# Patient Record
Sex: Female | Born: 1935 | Race: White | Hispanic: No | Smoking: Former smoker
Health system: Southern US, Community
[De-identification: ages and names within clinical notes are randomized; demographics above are authoritative.]

## PROBLEM LIST (undated history)

## (undated) DIAGNOSIS — C801 Malignant (primary) neoplasm, unspecified: Secondary | ICD-10-CM

## (undated) DIAGNOSIS — I509 Heart failure, unspecified: Secondary | ICD-10-CM

## (undated) HISTORY — PX: ABDOMINAL HYSTERECTOMY: SHX81

---

## 2006-03-20 ENCOUNTER — Ambulatory Visit: Payer: Self-pay | Admitting: Family Medicine

## 2006-04-02 ENCOUNTER — Ambulatory Visit: Payer: Self-pay | Admitting: Family Medicine

## 2007-02-04 ENCOUNTER — Ambulatory Visit: Payer: Self-pay | Admitting: Family

## 2007-06-13 ENCOUNTER — Ambulatory Visit: Payer: Self-pay | Admitting: Family

## 2007-06-27 ENCOUNTER — Ambulatory Visit: Payer: Self-pay | Admitting: Family Medicine

## 2008-01-09 ENCOUNTER — Ambulatory Visit: Payer: Self-pay | Admitting: Family

## 2008-08-06 ENCOUNTER — Ambulatory Visit: Payer: Self-pay | Admitting: Family

## 2008-09-24 ENCOUNTER — Ambulatory Visit: Payer: Self-pay | Admitting: Family Medicine

## 2009-02-25 ENCOUNTER — Ambulatory Visit: Payer: Self-pay | Admitting: Family

## 2009-07-22 ENCOUNTER — Ambulatory Visit: Payer: Self-pay | Admitting: Family

## 2009-07-30 ENCOUNTER — Ambulatory Visit: Payer: Self-pay | Admitting: Family

## 2009-09-27 ENCOUNTER — Ambulatory Visit: Payer: Self-pay | Admitting: Family Medicine

## 2010-02-17 ENCOUNTER — Ambulatory Visit: Payer: Self-pay | Admitting: Family

## 2010-05-26 ENCOUNTER — Ambulatory Visit: Payer: Self-pay | Admitting: Family

## 2010-12-12 ENCOUNTER — Ambulatory Visit: Payer: Self-pay | Admitting: Family Medicine

## 2010-12-16 ENCOUNTER — Ambulatory Visit: Payer: Self-pay | Admitting: Family Medicine

## 2011-07-27 ENCOUNTER — Ambulatory Visit: Payer: Self-pay | Admitting: Family Medicine

## 2012-09-04 ENCOUNTER — Ambulatory Visit: Payer: Self-pay | Admitting: Family Medicine

## 2013-10-14 ENCOUNTER — Ambulatory Visit: Payer: Self-pay | Admitting: Family Medicine

## 2013-10-24 ENCOUNTER — Ambulatory Visit: Payer: Self-pay | Admitting: Family Medicine

## 2013-12-30 DIAGNOSIS — M25511 Pain in right shoulder: Secondary | ICD-10-CM | POA: Insufficient documentation

## 2014-02-10 DIAGNOSIS — I10 Essential (primary) hypertension: Secondary | ICD-10-CM | POA: Insufficient documentation

## 2014-02-18 DIAGNOSIS — I059 Rheumatic mitral valve disease, unspecified: Secondary | ICD-10-CM | POA: Insufficient documentation

## 2014-10-06 ENCOUNTER — Other Ambulatory Visit: Payer: Self-pay | Admitting: Family Medicine

## 2014-10-06 DIAGNOSIS — Z1231 Encounter for screening mammogram for malignant neoplasm of breast: Secondary | ICD-10-CM

## 2014-10-20 ENCOUNTER — Other Ambulatory Visit: Payer: Self-pay | Admitting: Family Medicine

## 2014-10-20 ENCOUNTER — Ambulatory Visit
Admission: RE | Admit: 2014-10-20 | Discharge: 2014-10-20 | Disposition: A | Discharge status: Home / Self Care | Payer: Medicare Other | Source: Ambulatory Visit | Attending: Family Medicine | Admitting: Family Medicine

## 2014-10-20 DIAGNOSIS — Z1231 Encounter for screening mammogram for malignant neoplasm of breast: Secondary | ICD-10-CM | POA: Diagnosis not present

## 2016-01-10 ENCOUNTER — Encounter: Payer: Self-pay | Admitting: *Deleted

## 2016-01-10 DIAGNOSIS — W228XXA Striking against or struck by other objects, initial encounter: Secondary | ICD-10-CM | POA: Insufficient documentation

## 2016-01-10 DIAGNOSIS — Y999 Unspecified external cause status: Secondary | ICD-10-CM | POA: Insufficient documentation

## 2016-01-10 DIAGNOSIS — Y939 Activity, unspecified: Secondary | ICD-10-CM | POA: Insufficient documentation

## 2016-01-10 DIAGNOSIS — Y929 Unspecified place or not applicable: Secondary | ICD-10-CM | POA: Diagnosis not present

## 2016-01-10 DIAGNOSIS — I509 Heart failure, unspecified: Secondary | ICD-10-CM | POA: Insufficient documentation

## 2016-01-10 DIAGNOSIS — Z23 Encounter for immunization: Secondary | ICD-10-CM | POA: Insufficient documentation

## 2016-01-10 DIAGNOSIS — Z87891 Personal history of nicotine dependence: Secondary | ICD-10-CM | POA: Insufficient documentation

## 2016-01-10 DIAGNOSIS — S81811A Laceration without foreign body, right lower leg, initial encounter: Secondary | ICD-10-CM | POA: Insufficient documentation

## 2016-01-10 NOTE — ED Triage Notes (Signed)
Pt accidentally walked into lever on side of recliner sustaining laceration to R shin. Pt has large, irregular wound to R shin, bleeding controlled at this time. Drsg placed during triage. Pt has hx of CHF and has +2 dependent edema to bilateral lower extremities.

## 2016-01-11 ENCOUNTER — Emergency Department
Admission: EM | Admit: 2016-01-11 | Discharge: 2016-01-11 | Disposition: A | Discharge status: Home / Self Care | Payer: Medicare Other | Attending: Emergency Medicine | Admitting: Emergency Medicine

## 2016-01-11 DIAGNOSIS — S81811A Laceration without foreign body, right lower leg, initial encounter: Secondary | ICD-10-CM

## 2016-01-11 HISTORY — DX: Heart failure, unspecified: I50.9

## 2016-01-11 MED ORDER — TETANUS-DIPHTH-ACELL PERTUSSIS 5-2.5-18.5 LF-MCG/0.5 IM SUSP
0.5000 mL | Freq: Once | INTRAMUSCULAR | Status: AC
  Administered 2016-01-11: 0.5 mL via INTRAMUSCULAR
  Filled 2016-01-11: qty 0.5

## 2016-01-11 MED ORDER — SULFAMETHOXAZOLE-TRIMETHOPRIM 800-160 MG PO TABS: 1.0000 | ORAL_TABLET | Freq: Two times a day (BID) | ORAL | 0 refills | Status: DC

## 2016-01-11 MED ORDER — LIDOCAINE-EPINEPHRINE (PF) 1 %-1:200000 IJ SOLN
10.0000 mL | Freq: Once | INTRAMUSCULAR | Status: AC
  Administered 2016-01-11: 10 mL
  Filled 2016-01-11: qty 30

## 2016-01-11 NOTE — ED Notes (Signed)
PA at bedside.

## 2016-01-11 NOTE — ED Provider Notes (Signed)
[facility] Emergency Department Provider Note  ____________________________________________  Time seen: Approximately 3:31 AM  I have reviewed the triage vital signs and the nursing notes.   HISTORY  Chief Complaint Laceration    HPI Gloria Grimes is a 82 y.o. female who presents emergency department complaining of a deep gash to her right lower leg. Patient states that she was walking past her recliner when she accidentally hit the limit that controls the legs on the recliner. Patient states that she has a deep gash to her right lower extremity. Patient is unsure of her last tetanus shot. She is not on blood thinners. Patient was able control bleeding with direct pressure. She denies any numbness or tingling distally. She denies any other injury or complaint at this time.   Past Medical History:  Diagnosis Date  . CHF (congestive heart failure) ([place])     There are no active problems to display for this patient.   Past Surgical History:  Procedure Laterality Date  . ABDOMINAL HYSTERECTOMY      Prior to Admission medications   Medication Sig Start Date End Date Taking? Authorizing Provider  sulfamethoxazole-trimethoprim (BACTRIM DS,SEPTRA DS) 800-160 MG tablet Take 1 tablet by mouth 2 (two) times daily. 01/11/16   Charline Bills Cuthriell, PA-C    Allergies Penicillins and Shellfish allergy  History reviewed. No pertinent family history.  Social History Social History  Substance Use Topics  . Smoking status: Former Research scientist (life sciences)  . Smokeless tobacco: Never Used  . Alcohol use No     Comment: wine     Review of Systems  Constitutional: No fever/chills Cardiovascular: no chest pain. Respiratory: no cough. No SOB. Musculoskeletal: Negative for musculoskeletal pain. Skin: Positive for laceration to the right lower extremity. Neurological: Negative for headaches, focal weakness or numbness. 10-point ROS otherwise  negative.  ____________________________________________   PHYSICAL EXAM:  VITAL SIGNS: ED Triage Vitals  Enc Vitals Group     BP 01/10/16 2252 (!) 160/71     Pulse Rate 01/10/16 2252 82     Resp 01/10/16 2252 20     Temp 01/10/16 2252 98.1 F (36.7 C)     Temp Source 01/10/16 2252 Oral     SpO2 01/10/16 2252 93 %     Weight 01/10/16 2253 150 lb (68 kg)     Height 01/10/16 2253 5\' 2"  (1.575 m)     Head Circumference --      Peak Flow --      Pain Score 01/10/16 2253 2     Pain Loc --      Pain Edu? --      Excl. in [place]? --      Constitutional: Alert and oriented. Well appearing and in no acute distress. Eyes: Conjunctivae are normal. PERRL. EOMI. Head: Atraumatic. Cardiovascular: Normal rate, regular rhythm. Normal S1 and S2.  Good peripheral circulation. Patient does have bilateral lower extremity 2+ edema. Pulses are appreciated distally. Respiratory: Normal respiratory effort without tachypnea or retractions. Lungs CTAB. Good air entry to the bases with no decreased or absent breath sounds. Musculoskeletal: Full range of motion to all extremities. No gross deformities appreciated. Neurologic:  Normal speech and language. No gross focal neurologic deficits are appreciated.  Skin:  Skin is warm, dry and intact. No rash noted. The laceration noted to right lower leg. This is approximately 5 cm in length and 2 half centimeters in width. A avulsion/skin flap covers proximal portion. There is missing tissue at the distal aspect  of the laceration. No bleeding at this time. No visible foreign body. Pulses and sensation are intact distally. Psychiatric: Mood and affect are normal. Speech and behavior are normal. Patient exhibits appropriate insight and judgement.   ____________________________________________   LABS (all labs ordered are listed, but only abnormal results are displayed)  Labs Reviewed - No data to  display ____________________________________________  EKG   ____________________________________________  RADIOLOGY   No results found.  ____________________________________________    PROCEDURES  Procedure(s) performed:    Marland KitchenMarland KitchenLaceration Repair Date/Time: 01/11/2016 5:54 AM Performed by: Betha Loa D Authorized by: Betha Loa D   Consent:    Consent obtained:  Verbal   Consent given by:  Patient   Risks discussed:  Infection, poor cosmetic result, need for additional repair and poor wound healing   Alternatives discussed:  Referral Anesthesia (see MAR for exact dosages):    Anesthesia method:  Local infiltration   Local anesthetic:  Lidocaine 1% WITH epi Laceration details:    Location:  Leg   Leg location:  R lower leg   Length (cm):  5   Depth (mm):  10 Repair type:    Repair type:  Complex Pre-procedure details:    Preparation:  Patient was prepped and draped in usual sterile fashion Exploration:    Hemostasis achieved with:  Epinephrine and direct pressure   Wound exploration: wound explored through full range of motion and entire depth of wound probed and visualized     Contaminated: no   Treatment:    Area cleansed with:  Betadine   Amount of cleaning:  Extensive   Irrigation solution:  Sterile saline   Irrigation volume:  1L   Irrigation method:  Syringe   Debridement:  Minimal Skin repair:    Repair method:  Sutures   Suture size:  3-0   Suture material:  Nylon   Suture technique:  Simple interrupted   Number of sutures:  8 Approximation:    Approximation:  Loose Post-procedure details:    Dressing:  Non-adherent dressing   Patient tolerance of procedure:  Tolerated well, no immediate complications Comments:     Due to the nature of the injury there is missing epithelial tissue at the distal aspect of the laceration. Proximal flap is secured using 8 simple interrupted sutures. Distal aspect is covered with Surgicel and  bandaged.      Medications  lidocaine-EPINEPHrine (XYLOCAINE-EPINEPHrine) 1 %-1:200000 (PF) injection 10 mL (10 mLs Infiltration Given 01/11/16 0218)  Tdap (BOOSTRIX) injection 0.5 mL (0.5 mLs Intramuscular Given 01/11/16 0218)     ____________________________________________   INITIAL IMPRESSION / ASSESSMENT AND PLAN / ED COURSE  Pertinent labs & imaging results that were available during my care of the patient were reviewed by me and considered in my medical decision making (see chart for details).  Clinical Course    Patient's diagnosis is consistent with A laceration to the right lower extremity. This is treated as described above. There is an area that is not covered with epithelial tissu after repair e. This measures approximately 1 cm in diameter. This area is covered effusions Surgicel dressing as well as nonadherent dressing and Coban wrap.. Patient will be discharged home with prescriptions for antibiotics prophylactically.. Patient will follow-up with this department in 2 days for wound recheck. Patient is given ED precautions to return to the ED for any worsening or new symptoms.     ____________________________________________  FINAL CLINICAL IMPRESSION(S) / ED DIAGNOSES  Final diagnoses:  Laceration of right lower leg,  initial encounter      NEW MEDICATIONS STARTED DURING THIS VISIT:  Discharge Medication List as of 01/11/2016  3:32 AM    START taking these medications   Details  sulfamethoxazole-trimethoprim (BACTRIM DS,SEPTRA DS) 800-160 MG tablet Take 1 tablet by mouth 2 (two) times daily., Starting Tue 01/11/2016, Print            This chart was dictated using voice recognition software/Dragon. Despite best efforts to proofread, errors can occur which can change the meaning. Any change was purely unintentional.    Darletta Moll, PA-C 01/11/16 [id number]    Loney Hering, MD 01/11/16 [phone]

## 2016-01-13 ENCOUNTER — Encounter: Payer: Self-pay | Admitting: Emergency Medicine

## 2016-01-13 ENCOUNTER — Emergency Department
Admission: EM | Admit: 2016-01-13 | Discharge: 2016-01-13 | Disposition: A | Discharge status: Home / Self Care | Payer: Medicare Other | Attending: Emergency Medicine | Admitting: Emergency Medicine

## 2016-01-13 DIAGNOSIS — I509 Heart failure, unspecified: Secondary | ICD-10-CM | POA: Diagnosis not present

## 2016-01-13 DIAGNOSIS — Z87891 Personal history of nicotine dependence: Secondary | ICD-10-CM | POA: Insufficient documentation

## 2016-01-13 DIAGNOSIS — Z4801 Encounter for change or removal of surgical wound dressing: Secondary | ICD-10-CM | POA: Diagnosis present

## 2016-01-13 DIAGNOSIS — Z5189 Encounter for other specified aftercare: Secondary | ICD-10-CM

## 2016-01-13 NOTE — ED Notes (Signed)
See triage note, sensation/circulation intact to all limbs.  Pt denies SOB, CP, n/v/d, LOC or dizziness. NAD.

## 2016-01-13 NOTE — Discharge Instructions (Signed)
Keep area clean and dry. Remove the Ace bandage and top gauze dressing. Do not remove second layer of gauze from the leg. Perform this every other day until sutures are removed.

## 2016-01-13 NOTE — ED Triage Notes (Signed)
Pt seen 2 days ago for suture and was told to come for recheck.

## 2016-01-13 NOTE — ED Provider Notes (Signed)
[facility] Emergency Department Provider Note  ____________________________________________  Time seen: Approximately 4:36 PM  I have reviewed the triage vital signs and the nursing notes.   HISTORY  Chief Complaint Wound Check    HPI Gloria Grimes is a 82 y.o. female who presents emergency department for wound recheck. Patient was seen by myself 2 days prior for deep laceration/avulsion. See previous note for details. Due to the nature of the wound with a 1.5 cm area that was missing epidermal layers, patient was to follow up today with myself for wound evaluation. Patient denies any pain, swelling, increased erythema, or drainage from site. Patient has no pain at this time. Patient does have CHF with bilateral lower extremity edema. This is at baseline per patient. There is no change reported from time of previous visit.   Past Medical History:  Diagnosis Date  . CHF (congestive heart failure) ([place])     There are no active problems to display for this patient.   Past Surgical History:  Procedure Laterality Date  . ABDOMINAL HYSTERECTOMY      Prior to Admission medications   Medication Sig Start Date End Date Taking? Authorizing Provider  sulfamethoxazole-trimethoprim (BACTRIM DS,SEPTRA DS) 800-160 MG tablet Take 1 tablet by mouth 2 (two) times daily. 01/11/16   Charline Bills Cuthriell, PA-C    Allergies Penicillins and Shellfish allergy  History reviewed. No pertinent family history.  Social History Social History  Substance Use Topics  . Smoking status: Former Research scientist (life sciences)  . Smokeless tobacco: Never Used  . Alcohol use No     Comment: wine     Review of Systems  Constitutional: No fever/chills Cardiovascular: no chest pain. Respiratory: no cough. No SOB. Musculoskeletal: Negative for musculoskeletal pain. Skin: Positive for laceration/avulsion to right lower leg Neurological: Negative for headaches, focal weakness or numbness. 10-point  ROS otherwise negative.  ____________________________________________   PHYSICAL EXAM:  VITAL SIGNS: ED Triage Vitals [01/13/16 1558]  Enc Vitals Group     BP (!) 153/70     Pulse Rate 94     Resp 16     Temp 98.3 F (36.8 C)     Temp Source Oral     SpO2 99 %     Weight 150 lb (68 kg)     Height 5\' 2"  (1.575 m)     Head Circumference      Peak Flow      Pain Score      Pain Loc      Pain Edu?      Excl. in [place]?      Constitutional: Alert and oriented. Well appearing and in no acute distress. Eyes: Conjunctivae are normal. PERRL. EOMI. Head: Atraumatic. Cardiovascular: Normal rate, regular rhythm. Normal S1 and S2.  Good peripheral circulation. Respiratory: Normal respiratory effort without tachypnea or retractions. Lungs CTAB. Good air entry to the bases with no decreased or absent breath sounds. Musculoskeletal: Full range of motion to all extremities. No gross deformities appreciated. Neurologic:  Normal speech and language. No gross focal neurologic deficits are appreciated.  Skin:  Skin is warm, dry and intact. No rash noted.Laceration/avulsion is visualized. Bandaging judging is removed. Sutured area is healing appropriately with no signs of infection. Surgicel is still in place over both in wound. While this is not completely removed, surrounding area is visualized with no signs of infection. Area is nontender to palpation. Psychiatric: Mood and affect are normal. Speech and behavior are normal. Patient exhibits appropriate insight and  judgement.   ____________________________________________   LABS (all labs ordered are listed, but only abnormal results are displayed)  Labs Reviewed - No data to display ____________________________________________  EKG   ____________________________________________  RADIOLOGY   No results found.  ____________________________________________    PROCEDURES  Procedure(s) performed:    Procedures    Medications  - No data to display   ____________________________________________   INITIAL IMPRESSION / ASSESSMENT AND PLAN / ED COURSE  Pertinent labs & imaging results that were available during my care of the patient were reviewed by me and considered in my medical decision making (see chart for details).  Clinical Course    Patient's diagnosis is consistent with Wound follow-up. At this time wound appears to be healing appropriately both over the sutured portion as well as portion with no epidermal covering. Surgicel is still in place. No bleeding. No pustular drainage. Patient is given continued wound care instructions. Patient will continue antibiotics prescribed previously prophylactically.. Patient will follow-up with primary care provider in 5 days for suture removal Patient is given ED precautions to return to the ED for any worsening or new symptoms.     ____________________________________________  FINAL CLINICAL IMPRESSION(S) / ED DIAGNOSES  Final diagnoses:  Visit for wound check      NEW MEDICATIONS STARTED DURING THIS VISIT:  Discharge Medication List as of 01/13/2016  4:38 PM          This chart was dictated using voice recognition software/Dragon. Despite best efforts to proofread, errors can occur which can change the meaning. Any change was purely unintentional.    Darletta Moll, PA-C 01/13/16 [place], MD 01/13/16 2020

## 2016-02-02 ENCOUNTER — Encounter: Payer: Medicare Other | Attending: Internal Medicine | Admitting: Internal Medicine

## 2016-02-02 DIAGNOSIS — Z87891 Personal history of nicotine dependence: Secondary | ICD-10-CM | POA: Insufficient documentation

## 2016-02-02 DIAGNOSIS — Z880 Allergy status to penicillin: Secondary | ICD-10-CM | POA: Diagnosis not present

## 2016-02-02 DIAGNOSIS — I87301 Chronic venous hypertension (idiopathic) without complications of right lower extremity: Secondary | ICD-10-CM | POA: Insufficient documentation

## 2016-02-02 DIAGNOSIS — I429 Cardiomyopathy, unspecified: Secondary | ICD-10-CM | POA: Insufficient documentation

## 2016-02-02 DIAGNOSIS — I509 Heart failure, unspecified: Secondary | ICD-10-CM | POA: Insufficient documentation

## 2016-02-02 DIAGNOSIS — I110 Hypertensive heart disease with heart failure: Secondary | ICD-10-CM | POA: Diagnosis not present

## 2016-02-02 DIAGNOSIS — S81811S Laceration without foreign body, right lower leg, sequela: Secondary | ICD-10-CM | POA: Diagnosis not present

## 2016-02-02 DIAGNOSIS — X58XXXS Exposure to other specified factors, sequela: Secondary | ICD-10-CM | POA: Insufficient documentation

## 2016-02-03 NOTE — Progress Notes (Signed)
Gloria, Grimes ([id number]) Visit Report for 02/02/2016 Allergy List Details Patient Name: Gloria Grimes, Gloria Grimes Date of Service: 02/02/2016 9:30 AM Medical Record Patient Account Number: [id number] Number: Treating RN: Ahmed Prima 10-28-1930 (82 y.o. Other Clinician: Date of Birth/Sex: Female) Treating ROBSON, MICHAEL Primary Care Physician: [place] Pink Physician/Extender: G Referring Physician: Payton Doughty in Treatment: 0 Allergies Active Allergies penicillin Shellfish Containing Products erythromycin ethylsuccinate Declomycin clindamycin triamcinolone acetonide Allergy Notes Electronic Signature(s) Signed: 02/02/2016 5:27:01 PM By: Alric Quan Entered By: Alric Quan on 02/02/2016 09:50:17 Gloria Grimes ([id number]) -------------------------------------------------------------------------------- Arrival Information Details Patient Name: Gloria Grimes Date of Service: 02/02/2016 9:30 AM Medical Record Patient Account Number: [id number] Number: Treating RN: Ahmed Prima December 06, 1930 (82 y.o. Other Clinician: Date of Birth/Sex: Female) Treating ROBSON, MICHAEL Primary Care Physician: [place] Pink Physician/Extender: G Referring Physician: Payton Doughty in Treatment: 0 Visit Information Patient Arrived: Ambulatory Arrival Time: 09:47 Accompanied By: self Transfer Assistance: None Patient Identification Verified: Yes Secondary Verification Process Yes Completed: Patient Requires Transmission-Based No Precautions: Patient Has Alerts: No Electronic Signature(s) Signed: 02/02/2016 5:27:01 PM By: Alric Quan Entered By: Alric Quan on 02/02/2016 09:47:21 Gloria Grimes ([id number]) -------------------------------------------------------------------------------- Clinic Level of Care Assessment Details Patient Name: Gloria Grimes Date of Service: 02/02/2016 9:30 AM Medical Record Patient Account Number:  [id number] Number: Treating RN: Ahmed Prima 1930/08/25 (82 y.o. Other Clinician: Date of Birth/Sex: Female) Treating ROBSON, MICHAEL Primary Care Physician: [place] Pink Physician/Extender: G Referring Physician: Payton Doughty in Treatment: 0 Clinic Level of Care Assessment Items TOOL 1 Quantity Score X - Use when EandM and Procedure is performed on INITIAL visit 1 0 ASSESSMENTS - Nursing Assessment / Reassessment X - General Physical Exam (combine w/ comprehensive assessment (listed just 1 20 below) when performed on new pt. evals) X - Comprehensive Assessment (HX, ROS, Risk Assessments, Wounds Hx, etc.) 1 25 ASSESSMENTS - Wound and Skin Assessment / Reassessment []  - Dermatologic / Skin Assessment (not related to wound area) 0 ASSESSMENTS - Ostomy and/or Continence Assessment and Care []  - Incontinence Assessment and Management 0 []  - Ostomy Care Assessment and Management (repouching, etc.) 0 PROCESS - Coordination of Care []  - Simple Patient / Family Education for ongoing care 0 X - Complex (extensive) Patient / Family Education for ongoing care 1 20 X - Staff obtains Programmer, systems, Records, Test Results / Process Orders 1 10 []  - Staff telephones HHA, Nursing Homes / Clarify orders / etc 0 []  - Routine Transfer to another Facility (non-emergent condition) 0 []  - Routine Hospital Admission (non-emergent condition) 0 X - New Admissions / Biomedical engineer / Ordering NPWT, Apligraf, etc. 1 15 []  - Emergency Hospital Admission (emergent condition) 0 PROCESS - Special Needs []  - Pediatric / Minor Patient Management 0 TAQWA, LINKENHOKER. ([id number]) []  - Isolation Patient Management 0 []  - Hearing / Language / Visual special needs 0 []  - Assessment of Community assistance (transportation, D/C planning, etc.) 0 []  - Additional assistance / Altered mentation 0 []  - Support Surface(s) Assessment (bed, cushion, seat, etc.) 0 INTERVENTIONS - Miscellaneous []  -  External ear exam 0 []  - Patient Transfer (multiple staff / Civil Service fast streamer / Similar devices) 0 []  - Simple Staple / Suture removal (25 or less) 0 []  - Complex Staple / Suture removal (26 or more) 0 []  - Hypo/Hyperglycemic Management (do not check if billed separately) 0 X - Ankle / Brachial Index (ABI) - do not check if billed separately 1 15 Has the patient been  seen at the hospital within the last three years: Yes Total Score: 105 Level Of Care: New/Established - Level 3 Electronic Signature(s) Signed: 02/02/2016 5:27:01 PM By: Alric Quan Entered By: Alric Quan on 02/02/2016 16:19:45 Gloria Grimes ([id number]) -------------------------------------------------------------------------------- Encounter Discharge Information Details Patient Name: Gloria Grimes Date of Service: 02/02/2016 9:30 AM Medical Record Patient Account Number: [id number] Number: Treating RN: Ahmed Prima Mar 20, 1931 (82 y.o. Other Clinician: Date of Birth/Sex: Female) Treating ROBSON, MICHAEL Primary Care Physician: [place] Pink Physician/Extender: G Referring Physician: Payton Doughty in Treatment: 0 Encounter Discharge Information Items Discharge Pain Level: 0 Discharge Condition: Stable Ambulatory Status: Ambulatory Discharge Destination: Home Transportation: Private Auto Accompanied By: self Schedule Follow-up Appointment: Yes Medication Reconciliation completed and provided to Patient/Care No Angelissa Supan: Provided on Clinical Summary of Care: 02/02/2016 Form Type Recipient Paper Patient JF Electronic Signature(s) Signed: 02/02/2016 10:58:40 AM By: Ruthine Dose Entered By: Ruthine Dose on 02/02/2016 10:58:39 Gloria Grimes ([id number]) -------------------------------------------------------------------------------- Lower Extremity Assessment Details Patient Name: Gloria Grimes Date of Service: 02/02/2016 9:30 AM Medical Record Patient Account Number:  [id number] Number: Treating RN: Ahmed Prima 1930/08/09 (82 y.o. Other Clinician: Date of Birth/Sex: Female) Treating ROBSON, [place] Primary Care Physician: [place] Pink Physician/Extender: G Referring Physician: Payton Doughty in Treatment: 0 Edema Assessment Assessed: [Left: No] [Right: No] Edema: [Left: Yes] [Right: Yes] Calf Left: Right: Point of Measurement: 28 cm From Medial Instep 35.2 cm 35.7 cm Ankle Left: Right: Point of Measurement: 10 cm From Medial Instep 22.8 cm 22.5 cm Vascular Assessment Pulses: Posterior Tibial Dorsalis Pedis Palpable: [Left:No] [Right:No] Doppler: [Left:Monophasic] [Right:Monophasic] Extremity colors, hair growth, and conditions: Extremity Color: [Left:Red] [Right:Mottled] Hair Growth on Extremity: [Left:Yes] [Right:Yes] Capillary Refill: [Left:< 3 seconds] [Right:> 3 seconds] Blood Pressure: Brachial: [Left:130] [Right:142] Dorsalis Pedis: 100 [Left:Dorsalis Pedis:] Ankle: Posterior Tibial: 110 [Left:Posterior Tibial: 122 0.77] [Right:0.86] Toe Nail Assessment Left: Right: Thick: No No Discolored: No No Deformed: No No Improper Length and Hygiene: No No EMMALYN, COWDIN ([id number]) Electronic Signature(s) Signed: 02/02/2016 5:27:01 PM By: Alric Quan Entered By: Alric Quan on 02/02/2016 10:23:27 Gloria Grimes ([id number]) -------------------------------------------------------------------------------- Multi Wound Chart Details Patient Name: Gloria Grimes Date of Service: 02/02/2016 9:30 AM Medical Record Patient Account Number: [id number] Number: Treating RN: Ahmed Prima 1931-01-22 (82 y.o. Other Clinician: Date of Birth/Sex: Female) Treating ROBSON, Irvona Primary Care Physician: [place] Pink Physician/Extender: G Referring Physician: Payton Doughty in Treatment: 0 Vital Signs Height(in): 62 Pulse(bpm): 85 Weight(lbs): 156.9 Blood Pressure 134/52 (mmHg): Body  Mass Index(BMI): 29 Temperature(F): 97.7 Respiratory Rate 20 (breaths/min): Photos: [1:No Photos] [N/A:N/A] Wound Location: [1:Right Lower Leg - Anterior N/A] Wounding Event: [1:Trauma] [N/A:N/A] Primary Etiology: [1:Trauma, Other] [N/A:N/A] Comorbid History: [1:Congestive Heart Failure, N/A Hypertension] Date Acquired: [1:01/19/2016] [N/A:N/A] Weeks of Treatment: [1:0] [N/A:N/A] Wound Status: [1:Open] [N/A:N/A] Measurements L x W x D 2.1x1.8x0.3 [N/A:N/A] (cm) Area (cm) : [1:2.969] [N/A:N/A] Volume (cm) : [1:0.891] [N/A:N/A] % Reduction in Area: [1:0.00%] [N/A:N/A] % Reduction in Volume: 0.00% [N/A:N/A] Classification: [1:Partial Thickness] [N/A:N/A] Exudate Amount: [1:Large] [N/A:N/A] Exudate Type: [1:Serosanguineous] [N/A:N/A] Exudate Color: [1:red, brown] [N/A:N/A] Wound Margin: [1:Distinct, outline attached N/A] Granulation Amount: [1:Small (1-33%)] [N/A:N/A] Granulation Quality: [1:Red] [N/A:N/A] Necrotic Amount: [1:Large (67-100%)] [N/A:N/A] Necrotic Tissue: [1:Eschar, Adherent Slough N/A] Exposed Structures: [1:Fascia: No Fat: No Tendon: No Muscle: No] [N/A:N/A] Joint: No Bone: No Limited to Skin Breakdown Epithelialization: None N/A N/A Periwound Skin Texture: Edema: Yes N/A N/A Periwound Skin Maceration: Yes N/A N/A Moisture: Moist: Yes Periwound Skin Color: Erythema: Yes N/A  N/A Erythema Location: Circumferential N/A N/A Temperature: No Abnormality N/A N/A Tenderness on Yes N/A N/A Palpation: Wound Preparation: Ulcer Cleansing: N/A N/A Rinsed/Irrigated with Saline Topical Anesthetic Applied: Other: lidocaine 4% Treatment Notes Electronic Signature(s) Signed: 02/02/2016 5:27:01 PM By: Alric Quan Entered By: Alric Quan on 02/02/2016 10:36:21 VELETA, GUCK ([id number]) -------------------------------------------------------------------------------- [place] Details Patient Name: Gloria Grimes Date of Service:  02/02/2016 9:30 AM Medical Record Patient Account Number: [id number] Number: Treating RN: Ahmed Prima 02/13/1931 (82 y.o. Other Clinician: Date of Birth/Sex: Female) Treating ROBSON, [place] Primary Care Physician: [place] Pink Physician/Extender: G Referring Physician: Payton Doughty in Treatment: 0 Active Inactive Nutrition Nursing Diagnoses: Imbalanced nutrition Goals: Patient/caregiver agrees to and verbalizes understanding of need to use nutritional supplements and/or vitamins as prescribed Date Initiated: 02/02/2016 Goal Status: Active Interventions: Assess patient nutrition upon admission and as needed per policy Notes: Orientation to the Wound Care Program Nursing Diagnoses: Knowledge deficit related to the wound healing center program Goals: Patient/caregiver will verbalize understanding of the [facility] Date Initiated: 02/02/2016 Goal Status: Active Interventions: Provide education on orientation to the wound center Notes: Pain, Acute or Chronic Nursing Diagnoses: Pain, acute or chronic: actual or potential Potential alteration in comfort, pain ELLEEN, OSTERKAMP ([id number]) Goals: Patient will verbalize adequate pain control and receive pain control interventions during procedures as needed Date Initiated: 02/02/2016 Goal Status: Active Interventions: Assess comfort goal upon admission Complete pain assessment as per visit requirements Notes: Soft Tissue Infection Nursing Diagnoses: Impaired tissue integrity Goals: Patient/caregiver will verbalize understanding of or measures to prevent infection and contamination in the home setting Date Initiated: 02/02/2016 Goal Status: Active Patient's soft tissue infection will resolve Date Initiated: 02/02/2016 Goal Status: Active Interventions: Assess signs and symptoms of infection every visit Provide education on infection Notes: Wound/Skin Impairment Nursing  Diagnoses: Impaired tissue integrity Goals: Ulcer/skin breakdown will have a volume reduction of 30% by week 4 Date Initiated: 02/02/2016 Goal Status: Active Ulcer/skin breakdown will have a volume reduction of 50% by week 8 Date Initiated: 02/02/2016 Goal Status: Active Ulcer/skin breakdown will have a volume reduction of 80% by week 12 Date Initiated: 02/02/2016 CAYDINCE, BRESCIA ([id number]) Goal Status: Active Interventions: Assess patient/caregiver ability to obtain necessary supplies Assess ulceration(s) every visit Notes: Electronic Signature(s) Signed: 02/02/2016 5:27:01 PM By: Alric Quan Entered By: Alric Quan on 02/02/2016 10:36:05 Gloria Grimes ([id number]) -------------------------------------------------------------------------------- Pain Assessment Details Patient Name: Gloria Grimes Date of Service: 02/02/2016 9:30 AM Medical Record Patient Account Number: [id number] Number: Treating RN: Ahmed Prima 09/10/1930 (82 y.o. Other Clinician: Date of Birth/Sex: Female) Treating ROBSON, MICHAEL Primary Care Physician: [place] Pink Physician/Extender: G Referring Physician: Payton Doughty in Treatment: 0 Active Problems Location of Pain Severity and Description of Pain Patient Has Paino No Site Locations With Dressing Change: No Pain Management and Medication Current Pain Management: Electronic Signature(s) Signed: 02/02/2016 5:27:01 PM By: Alric Quan Entered By: Alric Quan on 02/02/2016 09:47:27 Gloria Grimes ([id number]) -------------------------------------------------------------------------------- Patient/Caregiver Education Details Patient Name: Gloria Grimes Date of Service: 02/02/2016 9:30 AM Medical Record Patient Account Number: [id number] Number: Treating RN: Ahmed Prima 1930-11-14 (82 y.o. Other Clinician: Date of Birth/Gender: Female) Treating ROBSON, Rochelle Primary Care Physician:  [place] Pink Physician/Extender: G Referring Physician: Payton Doughty in Treatment: 0 Education Assessment Education Provided To: Patient Education Topics Provided Infection: Handouts: Infection Prevention and Management Methods: Explain/Verbal Welcome To [facility]: Handouts: Welcome To [facility] Methods: Explain/Verbal Wound/Skin  Impairment: Handouts: Other: keep wral clean and dry Methods: Demonstration, Explain/Verbal Responses: State content correctly Electronic Signature(s) Signed: 02/02/2016 5:27:01 PM By: Alric Quan Entered By: Alric Quan on 02/02/2016 10:45:32 Gloria Grimes ([id number]) -------------------------------------------------------------------------------- Wound Assessment Details Patient Name: Gloria Grimes Date of Service: 02/02/2016 9:30 AM Medical Record Patient Account Number: [id number] Number: Treating RN: Ahmed Prima 09-30-1930 (82 y.o. Other Clinician: Date of Birth/Sex: Female) Treating ROBSON, [place] Primary Care Physician: [place] Pink Physician/Extender: G Referring Physician: Payton Doughty in Treatment: 0 Wound Status Wound Number: 1 Primary Trauma, Other Etiology: Wound Location: Right Lower Leg - Anterior Wound Status: Open Wounding Event: Trauma Comorbid Congestive Heart Failure, Date Acquired: 01/19/2016 History: Hypertension Weeks Of Treatment: 0 Clustered Wound: No Photos Photo Uploaded By: Alric Quan on 02/02/2016 17:20:57 Wound Measurements Length: (cm) 2.1 Width: (cm) 1.8 Depth: (cm) 0.3 Area: (cm) 2.969 Volume: (cm) 0.891 % Reduction in Area: 0% % Reduction in Volume: 0% Epithelialization: None Tunneling: No Undermining: No Wound Description Classification: Partial Thickness Wound Margin: Distinct, outline attached Exudate Amount: Large Exudate Type: Serosanguineous Exudate Color: red, brown Foul Odor After Cleansing: No Wound  Bed Granulation Amount: Small (1-33%) Exposed Structure Granulation Quality: Red Fascia Exposed: No Necrotic Amount: Large (67-100%) Fat Layer Exposed: No ROSALEA, POFF ([id number]) Necrotic Quality: Eschar, Adherent Slough Tendon Exposed: No Muscle Exposed: No Joint Exposed: No Bone Exposed: No Limited to Skin Breakdown Periwound Skin Texture Texture Color No Abnormalities Noted: No No Abnormalities Noted: No Localized Edema: Yes Erythema: Yes Erythema Location: Circumferential Moisture No Abnormalities Noted: No Temperature / Pain Maceration: Yes Temperature: No Abnormality Moist: Yes Tenderness on Palpation: Yes Wound Preparation Ulcer Cleansing: Rinsed/Irrigated with Saline Topical Anesthetic Applied: Other: lidocaine 4%, Treatment Notes Wound #1 (Right, Anterior Lower Leg) 1. Cleansed with: Clean wound with Normal Saline 2. Anesthetic Topical Lidocaine 4% cream to wound bed prior to debridement 3. Peri-wound Care: Moisturizing lotion 4. Dressing Applied: Prisma Ag 5. Secondary Dressing Applied ABD Pad Dry Gauze 7. Secured with Tape Notes kerlix, coban, charcoal Electronic Signature(s) Signed: 02/02/2016 5:27:01 PM By: Alric Quan Entered By: Alric Quan on 02/02/2016 10:28:21 LEYDA, ZEVENBERGEN ([id number]) -------------------------------------------------------------------------------- Vitals Details Patient Name: Gloria Grimes Date of Service: 02/02/2016 9:30 AM Medical Record Patient Account Number: [id number] Number: Treating RN: Ahmed Prima Apr 21, 1931 (82 y.o. Other Clinician: Date of Birth/Sex: Female) Treating ROBSON, [place]et Primary Care Physician: [place] Pink Physician/Extender: G Referring Physician: Payton Doughty in Treatment: 0 Vital Signs Time Taken: 09:47 Temperature (F): 97.7 Height (in): 62 Pulse (bpm): 85 Source: Stated Respiratory Rate (breaths/min): 20 Weight (lbs): 156.9 Blood Pressure  (mmHg): 134/52 Source: Measured Reference Range: 80 - 120 mg / dl Body Mass Index (BMI): 28.7 Electronic Signature(s) Signed: 02/02/2016 5:27:01 PM By: Alric Quan Entered By: Alric Quan on 02/02/2016 09:48:00

## 2016-02-03 NOTE — Progress Notes (Signed)
Gloria Grimes, Gloria Grimes ([id number]) Visit Report for 02/02/2016 Abuse/Suicide Risk Screen Details Patient Name: Gloria Grimes, Gloria Grimes Date of Service: 02/02/2016 9:30 AM Medical Record Patient Account Number: [id number] Number: Treating RN: Ahmed Prima 1931/02/15 (82 y.o. Other Clinician: Date of Birth/Sex: Female) Treating ROBSON, MICHAEL Primary Care Physician/Extender: Curly Rim Physician: Referring Physician: Payton Doughty in Treatment: 0 Abuse/Suicide Risk Screen Items Answer ABUSE/SUICIDE RISK SCREEN: Has anyone close to you tried to hurt or harm you recentlyo No Do you feel uncomfortable with anyone in your familyo No Has anyone forced you do things that you didnot want to doo No Do you have any thoughts of harming yourselfo No Patient displays signs or symptoms of abuse and/or neglect. No Electronic Signature(s) Signed: 02/02/2016 5:27:01 PM By: Alric Quan Entered By: Alric Quan on 02/02/2016 09:58:57 Gloria Grimes ([id number]) -------------------------------------------------------------------------------- Activities of Daily Living Details Patient Name: Gloria Grimes Date of Service: 02/02/2016 9:30 AM Medical Record Patient Account Number: [id number] Number: Treating RN: Ahmed Prima 1931/02/18 (82 y.o. Other Clinician: Date of Birth/Sex: Female) Treating ROBSON, MICHAEL Primary Care Physician/Extender: Curly Rim Physician: Referring Physician: Payton Doughty in Treatment: 0 Activities of Daily Living Items Answer Activities of Daily Living (Please select one for each item) Drive Automobile Completely Able Take Medications Completely Able Use Telephone Completely Able Care for Appearance Completely Able Use Toilet Completely Able Bath / Shower Completely Able Dress Self Completely Able Feed Self Completely Able Walk Completely Able Get In / Out Bed Completely Able Housework Completely Able Prepare  Meals Completely Able Handle Money Completely Able Shop for Self Completely Able Electronic Signature(s) Signed: 02/02/2016 5:27:01 PM By: Alric Quan Entered By: Alric Quan on 02/02/2016 09:59:29 Gloria Grimes ([id number]) -------------------------------------------------------------------------------- Education Assessment Details Patient Name: Gloria Grimes Date of Service: 02/02/2016 9:30 AM Medical Record Patient Account Number: [id number] Number: Treating RN: Ahmed Prima [birth year] (82 y.o. Other Clinician: Date of Birth/Sex: Female) Treating ROBSON, MICHAEL Primary Care Physician/Extender: Curly Rim Physician: Referring Physician: Payton Doughty in Treatment: 0 Primary Learner Assessed: Patient Learning Preferences/Education Level/Primary Language Learning Preference: Explanation, Printed Material Highest Education Level: College or Above Preferred Language: English Cognitive Barrier Assessment/Beliefs Language Barrier: No Translator Needed: No Memory Deficit: No Emotional Barrier: No Cultural/Religious Beliefs Affecting Medical No Care: Physical Barrier Assessment Impaired Vision: Yes Glasses Knowledge/Comprehension Assessment Knowledge Level: High Comprehension Level: High Ability to understand written High instructions: Ability to understand verbal High instructions: Motivation Assessment Anxiety Level: Calm Cooperation: Cooperative Education Importance: Acknowledges Need Interest in Health Problems: Asks Questions Perception: Coherent Willingness to Engage in Self- High Management Activities: Readiness to Engage in Self- High Management Activities: Gloria Grimes, Gloria Grimes ([id number]) Electronic Signature(s) Signed: 02/02/2016 5:27:01 PM By: Alric Quan Entered By: Alric Quan on 02/02/2016 09:59:47 Gloria Grimes, Gloria Grimes  ([id number]) -------------------------------------------------------------------------------- Fall Risk Assessment Details Patient Name: Gloria Grimes Date of Service: 02/02/2016 9:30 AM Medical Record Patient Account Number: [id number] Number: Treating RN: Ahmed Prima June 08, 1930 (82 y.o. Other Clinician: Date of Birth/Sex: Female) Treating ROBSON, MICHAEL Primary Care Physician/Extender: Curly Rim Physician: Referring Physician: Payton Doughty in Treatment: 0 Fall Risk Assessment Items Have you had 2 or more falls in the last 12 monthso 0 No Have you had any fall that resulted in injury in the last 12 monthso 0 No FALL RISK ASSESSMENT: History of falling - immediate or within 3 months 0 No Secondary diagnosis 0 No Ambulatory aid None/bed rest/wheelchair/nurse 0 No Crutches/cane/walker 0 No Furniture 0  No IV Access/Saline Lock 0 No Gait/Training Normal/bed rest/immobile 0 No Weak 0 No Impaired 0 No Mental Status Oriented to own ability 0 Yes Electronic Signature(s) Signed: 02/02/2016 5:27:01 PM By: Alric Quan Entered By: Alric Quan on 02/02/2016 10:00:04 Gloria Grimes ([id number]) -------------------------------------------------------------------------------- Foot Assessment Details Patient Name: Gloria Grimes Date of Service: 02/02/2016 9:30 AM Medical Record Patient Account Number: [id number] Number: Treating RN: Ahmed Prima Jan 08, 1931 (82 y.o. Other Clinician: Date of Birth/Sex: Female) Treating ROBSON, MICHAEL Primary Care Physician/Extender: Curly Rim Physician: Referring Physician: Payton Doughty in Treatment: 0 Foot Assessment Items Site Locations + = Sensation present, - = Sensation absent, C = Callus, U = Ulcer R = Redness, W = Warmth, M = Maceration, PU = Pre-ulcerative lesion F = Fissure, S = Swelling, D = Dryness Assessment Right: Left: Other Deformity: No No Prior Foot Ulcer: No  No Prior Amputation: No No Charcot Joint: No No Ambulatory Status: Ambulatory Without Help Gait: Steady Electronic Signature(s) Signed: 02/02/2016 5:27:01 PM By: Alric Quan Entered By: Alric Quan on 02/02/2016 10:03:22 Gloria Grimes ([name] ([id number]) -------------------------------------------------------------------------------- Nutrition Risk Assessment Details Patient Name: Gloria Grimes Date of Service: 02/02/2016 9:30 AM Medical Record Patient Account Number: [id number] Number: Treating RN: Ahmed Prima 09-14-1930 (82 y.o. Other Clinician: Date of Birth/Sex: Female) Treating ROBSON, MICHAEL Primary Care Physician/Extender: Curly Rim Physician: Referring Physician: Payton Doughty in Treatment: 0 Height (in): 62 Weight (lbs): 156.9 Body Mass Index (BMI): 28.7 Nutrition Risk Assessment Items NUTRITION RISK SCREEN: I have an illness or condition that made me change the kind and/or 0 No amount of food I eat I eat fewer than two meals per day 3 Yes I eat few fruits and vegetables, or milk products 0 No I have three or more drinks of beer, liquor or wine almost every day 0 No I have tooth or mouth problems that make it hard for me to eat 0 No I don't always have enough money to buy the food I need 0 No I eat alone most of the time 0 No I take three or more different prescribed or over-the-counter drugs a 0 No day Without wanting to, I have lost or gained 10 pounds in the last six 2 Yes months I am not always physically able to shop, cook and/or feed myself 0 No Nutrition Protocols Good Risk Protocol Provide education on Moderate Risk Protocol 0 nutrition Electronic Signature(s) Signed: 02/02/2016 5:27:01 PM By: Alric Quan Entered By: Alric Quan on 02/02/2016 10:00:21

## 2016-02-03 NOTE — Progress Notes (Signed)
Gloria, Grimes ([id number]) Visit Report for 02/02/2016 Chief Complaint Document Details Patient Name: Gloria Grimes, Gloria Grimes Date of Service: 02/02/2016 9:30 AM Medical Record Patient Account Number: [id number] Number: Treating RN: Ahmed Prima Jul 27, 1930 (82 y.o. Other Clinician: Date of Birth/Sex: Female) Treating Shemeika Starzyk Primary Care Physician/Extender: Curly Rim Physician: Referring Physician: Payton Doughty in Treatment: 0 Information Obtained from: Patient Chief Complaint 02/02/16; patient is here for review of a traumatic wound on her right lower leg Electronic Signature(s) Signed: 02/03/2016 12:55:33 PM By: Linton Ham MD Entered By: Linton Ham on 02/02/2016 11:01:36 Gloria Grimes ([id number]) -------------------------------------------------------------------------------- Debridement Details Patient Name: Gloria Grimes Date of Service: 02/02/2016 9:30 AM Medical Record Patient Account Number: [id number] Number: Treating RN: Ahmed Prima 1931-04-10 (82 y.o. Other Clinician: Date of Birth/Sex: Female) Treating Raad Clayson Primary Care Physician/Extender: Curly Rim Physician: Referring Physician: Payton Doughty in Treatment: 0 Debridement Performed for Wound #1 Right,Anterior Lower Leg Assessment: Performed By: Physician Ricard Dillon, MD Debridement: Debridement Pre-procedure Yes - 10:40 Verification/Time Out Taken: Start Time: 10:40 Pain Control: Lidocaine 4% Topical Solution Level: Skin/Subcutaneous Tissue Total Area Debrided (L x 2.1 (cm) x 1.8 (cm) = 3.78 (cm) W): Tissue and other Viable, Non-Viable, Exudate, Fibrin/Slough, Subcutaneous material debrided: Instrument: Curette Bleeding: Minimum Hemostasis Achieved: Pressure End Time: 10:44 Procedural Pain: 0 Post Procedural Pain: 0 Response to Treatment: Procedure was tolerated well Post Debridement Measurements of Total  Wound Length: (cm) 2.1 Width: (cm) 1.8 Depth: (cm) 0.4 Volume: (cm) 1.188 Character of Wound/Ulcer Post Stable Debridement: Severity of Tissue Post Debridement: Fat layer exposed Post Procedure Diagnosis Same as Pre-procedure Electronic Signature(s) Signed: 02/02/2016 5:27:01 PM By: Lenda Kelp ([id number]) Signed: 02/03/2016 12:55:33 PM By: Linton Ham MD Entered By: Linton Ham on 02/02/2016 11:00:39 Gloria Grimes ([id number]) -------------------------------------------------------------------------------- HPI Details Patient Name: Gloria Grimes Date of Service: 02/02/2016 9:30 AM Medical Record Patient Account Number: [id number] Number: Treating RN: Ahmed Prima 10-16-1930 (82 y.o. 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 She lives at home with her husband who is had recent health problems himself. In any case she was making a bed in her home 2 weeks ago and managed to traumatized her right lower leg on the handle of her reclining chair. Per the patient's description this was a punched-out wound over they made some attempt to suture this together with 7 sutures. She was given a course of oral antibiotics. Her primary physician remove the sutures 7 days later however the wound had already dehisced. Since then she's been using antibiotic ointment and nonadherent pad and some form of wrap applied at an urgent care. She has no prior wound history. She is receiving to rounds of oral antibiotics finishing today. She is not a diabetic and is a long standing ex-smoker. Her only other primary medical diagnosis is congestive  heart failure. The ABI's in this clinic was 0.7 on the right 0.8 on the left Electronic Signature(s) Signed: 02/03/2016 12:55:33 PM By: Linton Ham MD Entered By: Linton Ham on 02/02/2016 16:13:45 Gloria Grimes ([id number]) -------------------------------------------------------------------------------- Physical Exam Details Patient Name: Gloria Grimes Date of Service: 02/02/2016 9:30 AM Medical Record Patient Account Number: [id number] Number: Treating RN: Ahmed Prima 1931-01-24 (82 y.o.  Other Clinician: Date of Birth/Sex: Female) Treating Ishmael Berkovich Primary Care Physician/Extender: Curly Rim Physician: Referring Physician: Payton Doughty in Treatment: 0 Constitutional Sitting or standing Blood Pressure is within target range for patient.. Pulse regular and within target range for patient.Marland Kitchen Respirations regular, non-labored and within target range.. Temperature is normal and within the target range for the patient.. Patient's appearance is neat and clean. Appears in no acute distress. Well nourished and well developed.. Eyes Conjunctivae clear. No discharge.Marland Kitchen Respiratory Respiratory effort is easy and symmetric bilaterally. Rate is normal at rest and on room air.. Bilateral breath sounds are clear and equal in all lobes with no wheezes, rales or rhonchi.. Cardiovascular Heart rhythm and rate regular, without murmur or gallop.. Pedal pulses palpable and strong bilaterally.. Edema present in both extremities. The edema is mild. Some degree of chronic varicosities. Gastrointestinal (GI) Abdomen is soft and non-distended without masses or tenderness. Bowel sounds active in all quadrants.. No liver or spleen enlargement or tenderness.. Genitourinary (GU) Bladder without fullness, masses or tenderness.. Lymphatic Nonpalpable in the popliteal or inguinal area. Psychiatric No evidence of depression, anxiety, or agitation. Calm, cooperative,  and communicative. Appropriate interactions and affect.. Notes 02/02/16; wound is a punched-out wound in the right anterior calf. Base of this covered in a adherent surface slough the required debridement with a curet also some nonviable tissue removed. There is no evidence of surrounding infection. Post debridement the wound base looks very healthy Electronic Signature(s) Signed: 02/03/2016 12:55:33 PM By: Linton Ham MD Entered By: Linton Ham on 02/02/2016 11:08:15 Gloria Grimes ([id number]) -------------------------------------------------------------------------------- Physician Orders Details Patient Name: Gloria Grimes Date of Service: 02/02/2016 9:30 AM Medical Record Patient Account Number: [id number] Number: Treating RN: Ahmed Prima [birth year] (82 y.o. Other Clinician: Date of Birth/Sex: Female) Treating Hosanna Betley Primary Care Physician/Extender: Curly Rim Physician: Referring Physician: Payton Doughty in Treatment: 0 Verbal / Phone Orders: Yes Clinician: Carolyne Fiscal, Debi Read Back and Verified: Yes Diagnosis Coding Wound Cleansing Wound #1 Right,Anterior Lower Leg o Clean wound with Normal Saline. Anesthetic Wound #1 Right,Anterior Lower Leg o Topical Lidocaine 4% cream applied to wound bed prior to debridement Skin Barriers/Peri-Wound Care Wound #1 Right,Anterior Lower Leg o Moisturizing lotion Primary Wound Dressing Wound #1 Right,Anterior Lower Leg o Prisma Ag Secondary Dressing Wound #1 Right,Anterior Lower Leg o ABD pad o Dry Gauze - charcoal Dressing Change Frequency Wound #1 Right,Anterior Lower Leg o Change dressing every week Follow-up Appointments Wound #1 Right,Anterior Lower Leg o Return Appointment in 1 week. Edema Control Wound #1 Right,Anterior Lower Leg o Elevate legs to the level of the heart and pump ankles as often as possible BASHA, HARSHMAN. ([id number]) o Other: - Kerlix  and Coban unna to anchor Additional Orders / Instructions Wound #1 Right,Anterior Lower Leg o Increase protein intake. Electronic Signature(s) Signed: 02/02/2016 5:27:01 PM By: Alric Quan Signed: 02/03/2016 12:55:33 PM By: Linton Ham MD Entered By: Alric Quan on 02/02/2016 10:44:13 Gloria Grimes ([id number]) -------------------------------------------------------------------------------- Problem List Details Patient Name: Gloria Grimes Date of Service: 02/02/2016 9:30 AM Medical Record Patient Account Number: [id number] Number: Treating RN: Ahmed Prima May 31, 1930 (82 y.o. Other Clinician: Date of Birth/Sex: Female) Treating Danasha Melman Primary Care Physician/Extender: Curly Rim Physician: Referring Physician: Payton Doughty in Treatment: 0 Active Problems ICD-10 Encounter Code Description Active Date Diagnosis S81.811S Laceration without foreign body, right lower leg, sequela 02/02/2016 Yes I87.301 Chronic venous hypertension (idiopathic) without 0000000 Yes complications of right lower extremity Inactive  Problems Resolved Problems Electronic Signature(s) Signed: 02/03/2016 12:55:33 PM By: Linton Ham MD Entered By: Linton Ham on 02/02/2016 11:00:07 Gloria Grimes ([id number]) -------------------------------------------------------------------------------- Progress Note Details Patient Name: Gloria Grimes Date of Service: 02/02/2016 9:30 AM Medical Record Patient Account Number: [id number] Number: Treating RN: Ahmed Prima Sep 23, 1930 (82 y.o. 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 She lives at home with her husband who is had recent health problems himself. In any case she was making a bed in her home 2 weeks ago and managed to traumatized her right lower leg on the handle of her reclining chair. Per the patient's description this was a punched-out wound over they made some attempt to suture this together with 7 sutures. She was given a course of oral antibiotics. Her primary physician remove the sutures 7 days later however the wound had already dehisced. Since then she's been using antibiotic ointment and nonadherent pad and some form of wrap applied at an urgent care. She has no prior wound history. She is receiving to rounds of oral antibiotics finishing today. She is not a diabetic and is a long standing ex-smoker. Her only other primary medical diagnosis is congestive heart failure. The eyes in this clinic was 0.7 on the right 0.8 on the left Wound History Patient presents with 1 open wound that has been present for approximately 2 weeks ago. Patient has been treating wound in the following manner: ABT ointment and oral ABT. Laboratory tests have not been performed in the last month. Patient reportedly has not tested positive for an antibiotic resistant organism. Patient reportedly has not tested positive for osteomyelitis. Patient reportedly has not had testing performed to evaluate circulation in the legs. Patient experiences the following problems associated with their wounds: infection, swelling. Patient History Information obtained from Patient. Allergies penicillin, Shellfish Containing Products, erythromycin ethylsuccinate, Declomycin, clindamycin, triamcinolone acetonide Family History Heart Disease - Mother, Siblings, KEAUNA, BOLAM. ([id number]) No family history of Cancer, Diabetes, Hereditary Spherocytosis,  Hypertension, Kidney Disease, Lung Disease, Seizures, Stroke, Thyroid Problems, Tuberculosis. Social History Former smoker - quit 34 years ago, Marital Status - Married, Alcohol Use - Rarely - wine, Drug Use - No History, Caffeine Use - Daily. Medical History Cardiovascular Patient has history of Congestive Heart Failure, Hypertension Review of Systems (ROS) Constitutional Symptoms (General Health) The patient has no complaints or symptoms. Eyes Complains or has symptoms of Glasses / Contacts. Ear/Nose/Mouth/Throat The patient has no complaints or symptoms. Hematologic/Lymphatic The patient has no complaints or symptoms. Respiratory The patient has no complaints or symptoms. Cardiovascular hx idiopathic cardiomyopathy mitral valve disease Gastrointestinal The patient has no complaints or symptoms. Endocrine The patient has no complaints or symptoms. Genitourinary The patient has no complaints or symptoms. Immunological The patient has no complaints or symptoms. Integumentary (Skin) Complains or has symptoms of Wounds. Musculoskeletal The patient has no complaints or symptoms. Neurologic The patient has no complaints or symptoms. Oncologic The patient has no complaints or symptoms. Psychiatric The patient has no complaints or symptoms. Objective CAELAN, YARGER ([id number]) Constitutional Sitting or standing Blood Pressure is within target range for patient.. Pulse regular and within  target range for patient.Marland Kitchen Respirations regular, non-labored and within target range.. Temperature is normal and within the target range for the patient.. Patient's appearance is neat and clean. Appears in no acute distress. Well nourished and well developed.. Vitals Time Taken: 9:47 AM, Height: 62 in, Source: Stated, Weight: 156.9 lbs, Source: Measured, BMI: 28.7, Temperature: 97.7 F, Pulse: 85 bpm, Respiratory Rate: 20 breaths/min, Blood Pressure: 134/52 mmHg. Eyes Conjunctivae clear.  No discharge.Marland Kitchen Respiratory Respiratory effort is easy and symmetric bilaterally. Rate is normal at rest and on room air.. Bilateral breath sounds are clear and equal in all lobes with no wheezes, rales or rhonchi.. Cardiovascular Heart rhythm and rate regular, without murmur or gallop.. Pedal pulses palpable and strong bilaterally.. Edema present in both extremities. The edema is mild. Some degree of chronic varicosities. Gastrointestinal (GI) Abdomen is soft and non-distended without masses or tenderness. Bowel sounds active in all quadrants.. No liver or spleen enlargement or tenderness.. Genitourinary (GU) Bladder without fullness, masses or tenderness.. Lymphatic Nonpalpable in the popliteal or inguinal area. Psychiatric No evidence of depression, anxiety, or agitation. Calm, cooperative, and communicative. Appropriate interactions and affect.. General Notes: 02/02/16; wound is a punched-out wound in the right anterior calf. Base of this covered in a adherent surface slough the required debridement with a curet also some nonviable tissue removed. There is no evidence of surrounding infection. Post debridement the wound base looks very healthy Integumentary (Hair, Skin) Wound #1 status is Open. Original cause of wound was Trauma. The wound is located on the Right,Anterior Lower Leg. The wound measures 2.1cm length x 1.8cm width x 0.3cm depth; 2.969cm^2 area and 0.891cm^3 volume. The wound is limited to skin breakdown. There is no tunneling or undermining noted. There is a large amount of serosanguineous drainage noted. The wound margin is distinct with the outline attached to the wound base. There is small (1-33%) red granulation within the wound bed. There is a large (67-100%) amount of necrotic tissue within the wound bed including Eschar and Adherent Slough. The periwound skin appearance exhibited: Localized Edema, Maceration, Moist, Erythema. The surrounding wound skin color is noted  with erythema which is circumferential. Periwound temperature was EVA, LINQUIST. ([id number]) noted as No Abnormality. The periwound has tenderness on palpation. Assessment Active Problems ICD-10 S81.811S - Laceration without foreign body, right lower leg, sequela I87.301 - Chronic venous hypertension (idiopathic) without complications of right lower extremity Procedures Wound #1 Wound #1 is a Trauma, Other located on the Right,Anterior Lower Leg . There was a Skin/Subcutaneous Tissue Debridement [id number]) debridement with total area of 3.78 sq cm performed by Ricard Dillon, MD. with the following instrument(s): Curette to remove Viable and Non-Viable tissue/material including Exudate, Fibrin/Slough, and Subcutaneous after achieving pain control using Lidocaine 4% Topical Solution. A time out was conducted at 10:40, prior to the start of the procedure. A Minimum amount of bleeding was controlled with Pressure. The procedure was tolerated well with a pain level of 0 throughout and a pain level of 0 following the procedure. Post Debridement Measurements: 2.1cm length x 1.8cm width x 0.4cm depth; 1.188cm^3 volume. Character of Wound/Ulcer Post Debridement is stable. Severity of Tissue Post Debridement is: Fat layer exposed. Post procedure Diagnosis Wound #1: Same as Pre-Procedure Plan Wound Cleansing: Wound #1 Right,Anterior Lower Leg: Clean wound with Normal Saline. Anesthetic: Wound #1 Right,Anterior Lower Leg: Topical Lidocaine 4% cream applied to wound bed prior to debridement Skin Barriers/Peri-Wound Care: Wound #1 Right,Anterior Lower Leg: Moisturizing lotion CLAYTON, SCHIFFMAN. ([id number]) Primary Wound  Dressing: Wound #1 Right,Anterior Lower Leg: Prisma Ag Secondary Dressing: Wound #1 Right,Anterior Lower Leg: ABD pad Dry Gauze - charcoal Dressing Change Frequency: Wound #1 Right,Anterior Lower Leg: Change dressing every week Follow-up Appointments: Wound #1  Right,Anterior Lower Leg: Return Appointment in 1 week. Edema Control: Wound #1 Right,Anterior Lower Leg: Elevate legs to the level of the heart and pump ankles as often as possible Other: - Kerlix and Coban unna to anchor Additional Orders / Instructions: Wound #1 Right,Anterior Lower Leg: Increase protein intake. Traumatic laceration on the right lower leg that was initially sutured however the wound dehisced. Post debridement the base of this looks very healthy therefore elected to proceed directly with Prisma. As her ABI on the right side was only 0.7 I went ahead with only a 2 layer wrap. She is completed 2 different courses of antibiotics however I see no evidence of infection currently and no cultures were done of this area. The patient relates that her husband is at home but he is had recent health issues therefore is elected to keep the dressing on all week Patient has a history of congestive heart failure however I see no evidence of this at the bedside. She has mild edema in her lower legs bilaterally which I think is secondary to chronic venous insufficiency Electronic Signature(s) Signed: 02/03/2016 12:55:33 PM By: Linton Ham MD Entered By: Linton Ham on 02/02/2016 11:12:25 Gloria Grimes ([id number]) -------------------------------------------------------------------------------- ROS/PFSH Details Patient Name: Gloria Grimes Date of Service: 02/02/2016 9:30 AM Medical Record Patient Account Number: [id number] Number: Treating RN: Ahmed Prima 02/16/1931 (82 y.o. Other Clinician: Date of Birth/Sex: Female) Treating Rosabell Geyer Primary Care Physician/Extender: Curly Rim Physician: Referring Physician: Payton Doughty in Treatment: 0 Information Obtained From Patient Wound History Do you currently have one or more open woundso Yes How many open wounds do you currently haveo 1 Approximately how long have you had your woundso 2  weeks ago How have you been treating your wound(s) until nowo ABT ointment and oral ABT Has your wound(s) ever healed and then re-openedo No Have you had any lab work done in the past montho No Have you tested positive for an antibiotic resistant organism (MRSA, No VRE)o Have you tested positive for osteomyelitis (bone infection)o No Have you had any tests for circulation on your legso No Have you had other problems associated with your woundso Infection, Swelling Eyes Complaints and Symptoms: Positive for: Glasses / Contacts Integumentary (Skin) Complaints and Symptoms: Positive for: Wounds Constitutional Symptoms (General Health) Complaints and Symptoms: No Complaints or Symptoms Ear/Nose/Mouth/Throat Complaints and Symptoms: No Complaints or Symptoms Hematologic/Lymphatic MARIAPAZ, YOUNGS ([id number]) Complaints and Symptoms: No Complaints or Symptoms Respiratory Complaints and Symptoms: No Complaints or Symptoms Cardiovascular Complaints and Symptoms: Review of System Notes: hx idiopathic cardiomyopathy mitral valve disease Medical History: Positive for: Congestive Heart Failure; Hypertension Gastrointestinal Complaints and Symptoms: No Complaints or Symptoms Endocrine Complaints and Symptoms: No Complaints or Symptoms Genitourinary Complaints and Symptoms: No Complaints or Symptoms Immunological Complaints and Symptoms: No Complaints or Symptoms Musculoskeletal Complaints and Symptoms: No Complaints or Symptoms Neurologic Complaints and Symptoms: No Complaints or Symptoms Oncologic Complaints and Symptoms: No Complaints or Symptoms RADIE, KALLAS. ([id number]) Psychiatric Complaints and Symptoms: No Complaints or Symptoms Family and Social History Cancer: No; Diabetes: No; Heart Disease: Yes - Mother, Siblings; Hereditary Spherocytosis: No; Hypertension: No; Kidney Disease: No; Lung Disease: No; Seizures: No; Stroke: No; Thyroid Problems: No;  Tuberculosis: No; Former smoker - quit 34 years  ago; Marital Status - Married; Alcohol Use: Rarely - wine; Drug Use: No History; Caffeine Use: Daily; Financial Concerns: No; Food, Clothing or Shelter Needs: No; Support System Lacking: No; Transportation Concerns: No; Advanced Directives: No; Patient does not want information on Advanced Directives; Do not resuscitate: No; Living Will: Yes (Not Provided); Medical Power of Attorney: No Electronic Signature(s) Signed: 02/02/2016 5:27:01 PM By: Alric Quan Signed: 02/03/2016 12:55:33 PM By: Linton Ham MD Entered By: Alric Quan on 02/02/2016 09:58:44 Gloria Grimes ([id number]) -------------------------------------------------------------------------------- [place] Details Patient Name: Gloria Grimes Date of Service: 02/02/2016 Medical Record Patient Account Number: [id number] Number: Treating RN: Ahmed Prima 07/15/1930 (82 y.o. Other Clinician: Date of Birth/Sex: Female) Treating Jaziya Obarr Primary Care Physician/Extender: Curly Rim Physician: Suella Grove in Treatment: 0 Referring Physician: [place] Pink Diagnosis Coding ICD-10 Codes Code Description [phone] Laceration without foreign body, right lower leg, sequela I87.301 Chronic venous hypertension (idiopathic) without complications of right lower extremity Facility Procedures CPT4 Code: [id number] Description: [facility] VISIT-LEV 3 EST PT Modifier: Quantity: 1 CPT4 Code: [id number] Description: [id number] - DEB SUBQ TISSUE 20 SQ CM/< ICD-10 Description Diagnosis S81.811S Laceration without foreign body, right lower leg, Modifier: sequela Quantity: 1 Physician Procedures CPT4 Code: [id number] Description: WC PHYS LEVEL 3 o NEW PT ICD-10 Description Diagnosis [id number] Laceration without foreign body, right lower leg, Modifier: sequela Quantity: 1 CPT4 Code: [id number] Description: [id number] - WC PHYS SUBQ TISS 20 SQ CM ICD-10 Description Diagnosis  [id number] Laceration without foreign body, right lower leg, Modifier: sequela Quantity: 1 Electronic Signature(s) Signed: 02/02/2016 5:27:01 PM By: Alric Quan Signed: 02/03/2016 12:55:33 PM By: Linton Ham MD Entered By: Alric Quan on 02/02/2016 16:19:54

## 2016-02-09 ENCOUNTER — Encounter: Payer: Medicare Other | Admitting: Internal Medicine

## 2016-02-09 DIAGNOSIS — I87301 Chronic venous hypertension (idiopathic) without complications of right lower extremity: Secondary | ICD-10-CM | POA: Diagnosis not present

## 2016-02-10 NOTE — Progress Notes (Signed)
POLLYANNA, REHMERT ([id number]) Visit Report for 02/09/2016 Chief Complaint Document Details Patient Name: Gloria Grimes, Gloria Grimes Date of Service: 02/09/2016 1:45 PM Medical Record Patient Account Number: [id number] Number: Treating RN: Ahmed Prima Nov 21, 1930 (82 y.o. Other Clinician: Date of Birth/Sex: Female) Treating ROBSON, MICHAEL Primary Care Physician/Extender: Curly Rim Physician: Referring Physician: Payton Doughty in Treatment: 1 Information Obtained from: Patient Chief Complaint 02/02/16; patient is here for review of a traumatic wound on her right lower leg Electronic Signature(s) Signed: 02/10/2016 7:42:49 AM By: Linton Ham MD Entered By: Linton Ham on 02/09/2016 15:10:47 Gloria Grimes ([id number]) -------------------------------------------------------------------------------- Debridement Details Patient Name: Gloria Grimes Date of Service: 02/09/2016 1:45 PM Medical Record Patient Account Number: [id number] Number: Treating RN: Ahmed Prima [birth year] (82 y.o. Other Clinician: Date of Birth/Sex: Female) Treating ROBSON, MICHAEL Primary Care Physician/Extender: Curly Rim Physician: Referring Physician: Payton Doughty in Treatment: 1 Debridement Performed for Wound #1 Right,Anterior Lower Leg Assessment: Performed By: Physician Ricard Dillon, MD Debridement: Debridement Pre-procedure Yes - 14:22 Verification/Time Out Taken: Start Time: 14:22 Pain Control: Lidocaine 4% Topical Solution Level: Skin/Subcutaneous Tissue Total Area Debrided (L x 2 (cm) x 1.8 (cm) = 3.6 (cm) W): Tissue and other Viable, Non-Viable, Exudate, Fibrin/Slough, Subcutaneous material debrided: Instrument: Curette Bleeding: Minimum Hemostasis Achieved: Pressure End Time: 14:24 Procedural Pain: 0 Post Procedural Pain: 0 Response to Treatment: Procedure was tolerated well Post Debridement Measurements of Total  Wound Length: (cm) 2 Width: (cm) 1.8 Depth: (cm) 0.3 Volume: (cm) 0.848 Character of Wound/Ulcer Post Stable Debridement: Severity of Tissue Post Debridement: Fat layer exposed Post Procedure Diagnosis Same as Pre-procedure Electronic Signature(s) Signed: 02/09/2016 5:16:17 PM By: Lenda Kelp ([id number]) Signed: 02/10/2016 7:42:49 AM By: Linton Ham MD Entered By: Linton Ham on 02/09/2016 15:10:30 Gloria Grimes ([id number]) -------------------------------------------------------------------------------- HPI Details Patient Name: Gloria Grimes Date of Service: 02/09/2016 1:45 PM Medical Record Patient Account Number: [id number] Number: Treating RN: Ahmed Prima 1930-08-24 (82 y.o. 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 She lives at home with her husband who is had recent health problems himself. In any case she was making a bed in her home 2 weeks ago and managed to traumatized her right lower leg on the handle of her reclining chair. Per the patient's description this was a punched-out wound over they made some attempt to suture this together with 7 sutures. She was given a course of oral antibiotics. Her primary physician remove the sutures 7 days later however the wound had already dehisced. Since then she's been using antibiotic ointment and nonadherent pad and some form of wrap applied at an urgent care. She has no prior wound history. She is receiving to rounds of oral antibiotics finishing today. She is not a diabetic and is a long standing ex-smoker. Her only other primary medical diagnosis is congestive  heart failure. The ABI's in this clinic was 0.7 on the right 0.8 on the left 02/09/16; she arrives today with not much change in the wound surface. Still an adherent slough although this is better than last week. Clearly the decision to use Prisma on this was not the right decision, we'll change to Santyl this week. Electronic Signature(s) Signed: 02/10/2016 7:42:49 AM By: Linton Ham  MD Entered By: Linton Ham on 02/09/2016 15:11:45 Gloria Grimes ([id number]) -------------------------------------------------------------------------------- Physical Exam Details Patient Name: Gloria Grimes Date of Service: 02/09/2016 1:45 PM Medical Record Patient Account Number: [id number] Number: Treating RN: Ahmed Prima 06/04/1930 (82 y.o. Other Clinician: Date of Birth/Sex: Female) Treating ROBSON, MICHAEL Primary Care Physician/Extender: Curly Rim Physician: Referring Physician: Payton Doughty in Treatment: 1 Constitutional Sitting or standing Blood Pressure is within target range for patient.. Pulse regular and within target range for patient.Marland Kitchen Respirations regular, non-labored and within target range.. Temperature is normal and within the target range for the patient.. Patient appears well.. Cardiovascular Pedal pulses palpable and strong bilaterally.. Edema present in both extremities. However her edema is mild.. Notes Wound exam; still an adherent surface slough although better than last week, clearly this is going to require some additional debridement agent. We will switch her back to [facility]. There is no evidence of infection Electronic Signature(s) Signed: 02/10/2016 7:42:49 AM By: Linton Ham MD Entered By: Linton Ham on 02/09/2016 15:13:07 Gloria Grimes ([id number]) -------------------------------------------------------------------------------- Physician Orders Details Patient Name: Gloria Grimes Date of Service: 02/09/2016 1:45  PM Medical Record Patient Account Number: [id number] Number: Treating RN: Ahmed Prima Aug 15, 1930 (82 y.o. Other Clinician: Date of Birth/Sex: Female) Treating ROBSON, MICHAEL Primary Care Physician/Extender: Curly Rim Physician: Referring Physician: Payton Doughty in Treatment: 1 Verbal / Phone Orders: Yes Clinician: Carolyne Fiscal, Debi Read Back and Verified: Yes Diagnosis Coding Wound Cleansing Wound #1 Right,Anterior Lower Leg o Clean wound with Normal Saline. Anesthetic Wound #1 Right,Anterior Lower Leg o Topical Lidocaine 4% cream applied to wound bed prior to debridement Skin Barriers/Peri-Wound Care Wound #1 Right,Anterior Lower Leg o Barrier cream o Moisturizing lotion o Other: - TCA Primary Wound Dressing Wound #1 Right,Anterior Lower Leg o Santyl Ointment Secondary Dressing Wound #1 Right,Anterior Lower Leg o ABD pad o Dry Gauze - charcoal Dressing Change Frequency Wound #1 Right,Anterior Lower Leg o Change dressing every week Follow-up Appointments Wound #1 Right,Anterior Lower Leg o Return Appointment in 1 week. Edema Control Gloria Grimes, Gloria Grimes. ([id number]) Wound #1 Right,Anterior Lower Leg o Elevate legs to the level of the heart and pump ankles as often as possible o Other: - Kerlix and Coban unna to anchor Additional Orders / Instructions Wound #1 Right,Anterior Lower Leg o Increase protein intake. Electronic Signature(s) Signed: 02/09/2016 5:16:17 PM By: Alric Quan Signed: 02/10/2016 7:42:49 AM By: Linton Ham MD Entered By: Alric Quan on 02/09/2016 14:30:06 Gloria Grimes, Gloria Grimes ([id number]) -------------------------------------------------------------------------------- Problem List Details Patient Name: Gloria Grimes Date of Service: 02/09/2016 1:45 PM Medical Record Patient Account Number: [id number] Number: Treating RN: Ahmed Prima Dec 18, 1930 (82 y.o. Other  Clinician: Date of Birth/Sex: Female) Treating ROBSON, MICHAEL Primary Care Physician/Extender: Curly Rim Physician: Referring Physician: Payton Doughty in Treatment: 1 Active Problems ICD-10 Encounter Code Description Active Date Diagnosis S81.811S Laceration without foreign body, right lower leg, sequela 02/02/2016 Yes I87.301 Chronic venous hypertension (idiopathic) without 0000000 Yes complications of right lower extremity Inactive Problems Resolved Problems Electronic Signature(s) Signed: 02/10/2016 7:42:49 AM By: Linton Ham MD Entered By: Linton Ham on 02/09/2016 15:10:16 Gloria Grimes ([id number]) -------------------------------------------------------------------------------- Progress Note Details Patient Name: Gloria Grimes Date of Service: 02/09/2016 1:45 PM Medical Record Patient Account Number: [id number] Number: Treating RN: Ahmed Prima 12-29-1930 (82 y.o. 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 She lives at home with her husband who is had recent health problems himself. In any case she was making a bed in her home 2 weeks ago and managed to traumatized her right lower leg on the handle of her reclining chair. Per the patient's description this was a punched-out wound over they made some attempt to suture this together with 7 sutures. She was given a course of oral antibiotics. Her primary physician remove the sutures 7 days later however the wound had already dehisced. Since  then she's been using antibiotic ointment and nonadherent pad and some form of wrap applied at an urgent care. She has no prior wound history. She is receiving to rounds of oral antibiotics finishing today. She is not a diabetic and is a long standing ex-smoker. Her only other primary medical diagnosis is congestive heart failure. The ABI's in this clinic was 0.7 on the right 0.8 on the left 02/09/16; she arrives today with not much change in the wound surface. Still an adherent slough although this is better than last week. Clearly the decision to use Prisma on this was not the right decision, we'll change to Santyl this week. Objective Constitutional Sitting or standing Blood Pressure is within target range for patient.. Pulse regular and within target range for patient.Marland Kitchen Respirations regular, non-labored and within target range.. Temperature is normal and within the target range for the patient.. Patient appears well.. Vitals Time Taken: 2:04 PM, Height: 62 in, Weight: 156.9 lbs, BMI: 28.7, Temperature: 97.7 F, Pulse: 100 bpm, Respiratory Rate: 20 breaths/min, Blood Pressure: 132/50 mmHg. General Notes: MD notified diastolic BP was 50 Hinger, [place] ([id number]) Cardiovascular Pedal pulses palpable and strong bilaterally.. Edema present in both extremities. However her edema is mild.. General Notes: Wound exam; still an adherent surface slough although better than last week, clearly this is going to require some additional debridement agent. We will switch her back to [facility]. There is no evidence of infection Integumentary (Hair, Skin) Wound #1 status is Open. Original cause of wound was Trauma. The wound is located on the Right,Anterior Lower Leg. The wound measures 2cm length x 1.8cm width x 0.3cm depth; 2.827cm^2 area and 0.848cm^3 volume. The wound is limited to skin breakdown. There is no tunneling or undermining noted. There is a large amount of serosanguineous drainage noted.  The wound margin is distinct with the outline attached to the wound base. There is small (1-33%) red granulation within the wound bed. There is a large (67-100%) amount of necrotic tissue within the wound bed including Eschar and Adherent Slough. The periwound skin appearance exhibited: Localized Edema, Maceration, Moist, Erythema. The surrounding wound skin color is noted with erythema which is circumferential. Periwound temperature was noted as No Abnormality. The periwound has tenderness on palpation. Assessment Active Problems ICD-10 S81.811S - Laceration without foreign body, right lower leg, sequela I87.301 - Chronic venous hypertension (idiopathic) without complications of right lower extremity Procedures Wound #1 Wound #1 is a Trauma, Other located on the Right,Anterior Lower Leg . There was a Skin/Subcutaneous Tissue Debridement [id number]) debridement with total area of 3.6 sq cm performed by Ricard Dillon, MD. with the following instrument(s): Curette to remove Viable and Non-Viable tissue/material including Exudate, Fibrin/Slough, and Subcutaneous after achieving pain control using  Lidocaine 4% Topical Solution. A time out was conducted at 14:22, prior to the start of the procedure. A Minimum amount of bleeding was controlled with Pressure. The procedure was tolerated well with a pain level of 0 throughout and a pain level of 0 following the procedure. Post Debridement Measurements: 2cm length x 1.8cm width x 0.3cm depth; 0.848cm^3 volume. Character of Wound/Ulcer Post Debridement is stable. Severity of Tissue Post Debridement is: Fat layer exposed. Gloria Grimes, Gloria Grimes ([id number]) Post procedure Diagnosis Wound #1: Same as Pre-Procedure Plan Wound Cleansing: Wound #1 Right,Anterior Lower Leg: Clean wound with Normal Saline. Anesthetic: Wound #1 Right,Anterior Lower Leg: Topical Lidocaine 4% cream applied to wound bed prior to debridement Skin Barriers/Peri-Wound  Care: Wound #1 Right,Anterior Lower Leg: Barrier cream Moisturizing lotion Other: - TCA Primary Wound Dressing: Wound #1 Right,Anterior Lower Leg: Santyl Ointment Secondary Dressing: Wound #1 Right,Anterior Lower Leg: ABD pad Dry Gauze - charcoal Dressing Change Frequency: Wound #1 Right,Anterior Lower Leg: Change dressing every week Follow-up Appointments: Wound #1 Right,Anterior Lower Leg: Return Appointment in 1 week. Edema Control: Wound #1 Right,Anterior Lower Leg: Elevate legs to the level of the heart and pump ankles as often as possible Other: - Kerlix and Coban unna to anchor Additional Orders / Instructions: Wound #1 Right,Anterior Lower Leg: Increase protein intake. I have change to santyl from prisma when the wound bed is better back to prisma or Gloria Grimes, Gloria Grimes ([id number]) Electronic Signature(s) Signed: 02/10/2016 7:42:49 AM By: Linton Ham MD Entered By: Linton Ham on 02/09/2016 15:15:03 Gloria Grimes, Gloria Grimes ([id number]) -------------------------------------------------------------------------------- SuperBill Details Patient Name: Gloria Grimes Date of Service: 02/09/2016 Medical Record Patient Account Number: [id number] Number: Treating RN: Ahmed Prima 1930/12/17 (82 y.o. Other Clinician: Date of Birth/Sex: Female) Treating ROBSON, MICHAEL Primary Care Physician/Extender: Curly Rim Physician: Suella [place] in Treatment: 1 Referring Physician: [place] Diagnosis Coding ICD-10 Codes Code Description [phone] Laceration without foreign body, right lower leg, sequela I87.301 Chronic venous hypertension (idiopathic) without complications of right lower extremity Facility Procedures CPT4 Code: [id number] Description: [id number] - DEB SUBQ TISSUE 20 SQ CM/< ICD-10 Description Diagnosis S81.811S Laceration without foreign body, right lower leg, Modifier: sequela Quantity: 1 Physician Procedures CPT4 Code:  [id number] Description: [id number] - WC PHYS SUBQ TISS 20 SQ CM ICD-10 Description Diagnosis [id number] Laceration without foreign body, right lower leg, Modifier: sequela Quantity: 1 Electronic Signature(s) Signed: 02/10/2016 7:42:49 AM By: Linton Ham MD Entered By: Linton Ham on 02/09/2016 15:15:23

## 2016-02-10 NOTE — Progress Notes (Signed)
AASHNI, LUFF ([id number]) Visit Report for 02/09/2016 Arrival Information Details Patient Name: Gloria Grimes, Gloria Grimes Date of Service: 02/09/2016 1:45 PM Medical Record Patient Account Number: [id number] Number: Treating RN: Ahmed Prima [birth year] (82 y.o. Other Clinician: Date of Birth/Sex: Female) Treating ROBSON, MICHAEL Primary Care Physician: [place] Pink Physician/Extender: G Referring Physician: Payton Doughty in Treatment: 1 Visit Information History Since Last Visit All ordered tests and consults were completed: No Patient Arrived: Ambulatory Added or deleted any medications: No Arrival Time: 14:03 Any new allergies or adverse reactions: No Accompanied By: self Had a fall or experienced change in No Transfer Assistance: None activities of daily living that may affect Patient Identification Verified: Yes risk of falls: Secondary Verification Process Yes Signs or symptoms of abuse/neglect since last No Completed: visito Patient Requires Transmission-Based No Hospitalized since last visit: No Precautions: Pain Present Now: No Patient Has Alerts: No Electronic Signature(s) Signed: 02/09/2016 5:16:17 PM By: Alric Quan Entered By: Alric Quan on 02/09/2016 14:04:23 [name] ([id number]) -------------------------------------------------------------------------------- Encounter Discharge Information Details Patient Name: [name] Date of Service: 02/09/2016 1:45 PM Medical Record Patient Account Number: [id number] Number: Treating RN: Ahmed Prima 17-Feb-1931 (82 y.o. Other Clinician: Date of Birth/Sex: Female) Treating ROBSON, MICHAEL Primary Care Physician: [place] Pink Physician/Extender: G Referring Physician: Payton Doughty in Treatment: 1 Encounter Discharge Information Items Discharge Pain Level: 0 Discharge Condition: Stable Ambulatory Status: Ambulatory Discharge Destination:  Home Transportation: Private Auto Accompanied By: self Schedule Follow-up Appointment: Yes Medication Reconciliation completed and provided to Patient/Care Yes Judd Mccubbin: Provided on Clinical Summary of Care: 02/09/2016 Form Type Recipient Paper Patient JF Electronic Signature(s) Signed: 02/09/2016 2:43:23 PM By: Ruthine Dose Entered By: Ruthine Dose on 02/09/2016 14:43:23 Aispuro, Desma Maxim ([id number]) -------------------------------------------------------------------------------- Lower Extremity Assessment Details Patient Name: [name] Date of Service: 02/09/2016 1:45 PM Medical Record Patient Account Number: [id number] Number: Treating RN: Ahmed Prima 10-04-1930 (82 y.o. Other Clinician: Date of Birth/Sex: Female) Treating ROBSON, MICHAEL Primary Care Physician: [place] Pink Physician/Extender: G Referring Physician: Payton Doughty in Treatment: 1 Edema Assessment Assessed: [Left: No] [Right: No] E[Left: dema] [Right: :] Calf Left: Right: Point of Measurement: 28 cm From Medial Instep cm 35.4 cm Ankle Left: Right: Point of Measurement: 10 cm From Medial Instep cm 22.5 cm Vascular Assessment Pulses: Posterior Tibial Dorsalis Pedis Palpable: [Right:Yes] Extremity colors, hair growth, and conditions: Extremity Color: [Right:Mottled] Temperature of Extremity: [Right:Warm] Capillary Refill: [Right:< 3 seconds] Toe Nail Assessment Left: Right: Thick: No Discolored: No Deformed: No Improper Length and Hygiene: No Electronic Signature(s) Signed: 02/09/2016 5:16:17 PM By: Alric Quan Entered By: Alric Quan on 02/09/2016 14:10:58 [name] ([id number] Muff, Desma Maxim ([id number]) -------------------------------------------------------------------------------- Multi Wound Chart Details Patient Name: [name] Date of Service: 02/09/2016 1:45 PM Medical Record Patient Account Number:  [id number] Number: Treating RN: Ahmed Prima January 13, 1931 (82 y.o. Other Clinician: Date of Birth/Sex: Female) Treating ROBSON, [place] Primary Care Physician: [place] Pink Physician/Extender: G Referring Physician: Payton Doughty in Treatment: 1 Vital Signs Height(in): 62 Pulse(bpm): 100 Weight(lbs): 156.9 Blood Pressure 132/50 (mmHg): Body Mass Index(BMI): 29 Temperature(F): 97.7 Respiratory Rate 20 (breaths/min): Photos: [1:No Photos] [N/A:N/A] Wound Location: [1:Right Lower Leg - Anterior N/A] Wounding Event: [1:Trauma] [N/A:N/A] Primary Etiology: [1:Trauma, Other] [N/A:N/A] Comorbid History: [1:Congestive Heart Failure, N/A Hypertension] Date Acquired: [1:01/19/2016] [N/A:N/A] Weeks of Treatment: [1:1] [N/A:N/A] Wound Status: [1:Open] [N/A:N/A] Measurements L x W x D 2x1.8x0.3 [N/A:N/A] (cm) Area (cm) : [1:2.827] [N/A:N/A] Volume (cm) : [1:0.848] [N/A:N/A] % Reduction  in Area: [1:4.80%] [N/A:N/A] % Reduction in Volume: 4.80% [N/A:N/A] Classification: [1:Partial Thickness] [N/A:N/A] Exudate Amount: [1:Large] [N/A:N/A] Exudate Type: [1:Serosanguineous] [N/A:N/A] Exudate Color: [1:red, brown] [N/A:N/A] Wound Margin: [1:Distinct, outline attached N/A] Granulation Amount: [1:Small (1-33%)] [N/A:N/A] Granulation Quality: [1:Red] [N/A:N/A] Necrotic Amount: [1:Large (67-100%)] [N/A:N/A] Necrotic Tissue: [1:Eschar, Adherent Slough N/A] Exposed Structures: [1:Fascia: No Fat: No Tendon: No Muscle: No] [N/A:N/A] Joint: No Bone: No Limited to Skin Breakdown Epithelialization: None N/A N/A Periwound Skin Texture: Edema: Yes N/A N/A Periwound Skin Maceration: Yes N/A N/A Moisture: Moist: Yes Periwound Skin Color: Erythema: Yes N/A N/A Erythema Location: Circumferential N/A N/A Temperature: No Abnormality N/A N/A Tenderness on Yes N/A N/A Palpation: Wound Preparation: Ulcer Cleansing: N/A N/A Rinsed/Irrigated with Saline Topical  Anesthetic Applied: Other: lidocaine 4% Treatment Notes Electronic Signature(s) Signed: 02/09/2016 5:16:17 PM By: Alric Quan Entered By: Alric Quan on 02/09/2016 14:21:10 GIOVANNY, ESCHRICH ([id number]) -------------------------------------------------------------------------------- [place] Details Patient Name: [name] Date of Service: 02/09/2016 1:45 PM Medical Record Patient Account Number: [id number] Number: Treating RN: Ahmed Prima Apr 29, 1931 (82 y.o. Other Clinician: Date of Birth/Sex: Female) Treating ROBSON, [place] Primary Care Physician: [place] Pink Physician/Extender: G Referring Physician: Payton Doughty in Treatment: 1 Active Inactive Nutrition Nursing Diagnoses: Imbalanced nutrition Goals: Patient/caregiver agrees to and verbalizes understanding of need to use nutritional supplements and/or vitamins as prescribed Date Initiated: 02/02/2016 Goal Status: Active Interventions: Assess patient nutrition upon admission and as needed per policy Notes: Orientation to the Wound Care Program Nursing Diagnoses: Knowledge deficit related to the wound healing center program Goals: Patient/caregiver will verbalize understanding of the [facility] Date Initiated: 02/02/2016 Goal Status: Active Interventions: Provide education on orientation to the wound center Notes: Pain, Acute or Chronic Nursing Diagnoses: Pain, acute or chronic: actual or potential Potential alteration in comfort, pain MAVERY, WESTERLUND ([id number]) Goals: Patient will verbalize adequate pain control and receive pain control interventions during procedures as needed Date Initiated: 02/02/2016 Goal Status: Active Interventions: Assess comfort goal upon admission Complete pain assessment as per visit requirements Notes: Soft Tissue Infection Nursing Diagnoses: Impaired tissue integrity Goals: Patient/caregiver will  verbalize understanding of or measures to prevent infection and contamination in the home setting Date Initiated: 02/02/2016 Goal Status: Active Patient's soft tissue infection will resolve Date Initiated: 02/02/2016 Goal Status: Active Interventions: Assess signs and symptoms of infection every visit Provide education on infection Treatment Activities: Education provided on Infection : 02/02/2016 Notes: Wound/Skin Impairment Nursing Diagnoses: Impaired tissue integrity Goals: Ulcer/skin breakdown will have a volume reduction of 30% by week 4 Date Initiated: 02/02/2016 Goal Status: Active Ulcer/skin breakdown will have a volume reduction of 50% by week 8 Date Initiated: 02/02/2016 [name] ([id number]) Goal Status: Active Ulcer/skin breakdown will have a volume reduction of 80% by week 12 Date Initiated: 02/02/2016 Goal Status: Active Interventions: Assess patient/caregiver ability to obtain necessary supplies Assess ulceration(s) every visit Notes: Electronic Signature(s) Signed: 02/09/2016 5:16:17 PM By: Alric Quan Entered By: Alric Quan on 02/09/2016 14:21:04 [name] ([id number]) -------------------------------------------------------------------------------- Pain Assessment Details Patient Name: [name] Date of Service: 02/09/2016 1:45 PM Medical Record Patient Account Number: [id number] Number: Treating RN: Ahmed Prima 1931/01/13 (82 y.o. Other Clinician: Date of Birth/Sex: Female) Treating ROBSON, MICHAEL Primary Care Physician: [place] Pink Physician/Extender: G Referring Physician: Payton Doughty in Treatment: 1 Active Problems Location of Pain Severity and Description of Pain Patient Has Paino No Site Locations With Dressing Change: No Pain Management and Medication Current Pain Management: Electronic Signature(s)  Signed: 02/09/2016 5:16:17 PM By: Alric Quan Entered By: Alric Quan on 02/09/2016  14:04:28 [name] ([id number]) -------------------------------------------------------------------------------- Patient/Caregiver Education Details Patient Name: [name] Date of Service: 02/09/2016 1:45 PM Medical Record Patient Account Number: [id number] Number: Treating RN: Ahmed Prima 07/02/1930 (82 y.o. Other Clinician: Date of Birth/Gender: Female) Treating ROBSON, MICHAEL Primary Care Physician: [place] Pink Physician/Extender: G Referring Physician: Payton Doughty in Treatment: 1 Education Assessment Education Provided To: Patient Education Topics Provided Wound/Skin Impairment: Handouts: Other: keep wrap dry Methods: Demonstration, Explain/Verbal Responses: State content correctly Electronic Signature(s) Signed: 02/09/2016 5:16:17 PM By: Alric Quan Entered By: Alric Quan on 02/09/2016 14:27:00 [name] ([id number]) -------------------------------------------------------------------------------- Wound Assessment Details Patient Name: [name] Date of Service: 02/09/2016 1:45 PM Medical Record Patient Account Number: [id number] Number: Treating RN: Ahmed Prima 1930-12-11 (82 y.o. Other Clinician: Date of Birth/Sex: Female) Treating ROBSON, [place] Primary Care Physician: [place] Pink Physician/Extender: G Referring Physician: Payton Doughty in Treatment: 1 Wound Status Wound Number: 1 Primary Trauma, Other Etiology: Wound Location: Right Lower Leg - Anterior Wound Status: Open Wounding Event: Trauma Comorbid Congestive Heart Failure, Date Acquired: 01/19/2016 History: Hypertension Weeks Of Treatment: 1 Clustered Wound: No Photos Photo Uploaded By: Alric Quan on 02/09/2016 17:03:31 Wound Measurements Length: (cm) 2 Width: (cm) 1.8 Depth: (cm) 0.3 Area: (cm) 2.827 Volume: (cm) 0.848 % Reduction in Area: 4.8% % Reduction in Volume: 4.8% Epithelialization:  None Tunneling: No Undermining: No Wound Description Classification: Partial Thickness Wound Margin: Distinct, outline attached Exudate Amount: Large Exudate Type: Serosanguineous Exudate Color: red, brown Foul Odor After Cleansing: No Wound Bed Granulation Amount: Small (1-33%) Exposed Structure Granulation Quality: Red Fascia Exposed: No Necrotic Amount: Large (67-100%) Fat Layer Exposed: No TOYE, PATZKE ([id number]) Necrotic Quality: Eschar, Adherent Slough Tendon Exposed: No Muscle Exposed: No Joint Exposed: No Bone Exposed: No Limited to Skin Breakdown Periwound Skin Texture Texture Color No Abnormalities Noted: No No Abnormalities Noted: No Localized Edema: Yes Erythema: Yes Erythema Location: Circumferential Moisture No Abnormalities Noted: No Temperature / Pain Maceration: Yes Temperature: No Abnormality Moist: Yes Tenderness on Palpation: Yes Wound Preparation Ulcer Cleansing: Rinsed/Irrigated with Saline Topical Anesthetic Applied: Other: lidocaine 4%, Treatment Notes Wound #1 (Right, Anterior Lower Leg) 1. Cleansed with: Clean wound with Normal Saline Cleanse wound with antibacterial soap and water 2. Anesthetic Topical Lidocaine 4% cream to wound bed prior to debridement 3. Peri-wound Care: Barrier cream Other peri-wound care (specify in notes) 4. Dressing Applied: Santyl Ointment 5. Secondary Dressing Applied ABD Pad Dry Gauze 7. Secured with Tape Notes kerlix, coban, charcoal, TCA Electronic Signature(s) Signed: 02/09/2016 5:16:17 PM By: Alric Quan Entered By: Alric Quan on 02/09/2016 14:14:08 [name] ([id number]) -------------------------------------------------------------------------------- Vitals Details Patient Name: [name] Date of Service: 02/09/2016 1:45 PM Medical Record Patient Account Number: [id number] Number: Treating RN: Ahmed Prima 12/15/1930 (82 y.o. Other Clinician: Date of  Birth/Sex: Female) Treating ROBSON, MICHAEL Primary Care Physician: [place] Pink Physician/Extender: G Referring Physician: Payton Doughty in Treatment: 1 Vital Signs Time Taken: 14:04 Temperature (F): 97.7 Height (in): 62 Pulse (bpm): 100 Weight (lbs): 156.9 Respiratory Rate (breaths/min): 20 Body Mass Index (BMI): 28.7 Blood Pressure (mmHg): 132/50 Reference Range: 80 - 120 mg / dl Notes MD notified diastolic BP was 50 Electronic Signature(s) Signed: 02/09/2016 5:16:17 PM By: Alric Quan Entered By: Alric Quan on 02/09/2016 14:06:25

## 2016-02-16 ENCOUNTER — Encounter: Payer: Medicare Other | Admitting: Internal Medicine

## 2016-02-16 DIAGNOSIS — I87301 Chronic venous hypertension (idiopathic) without complications of right lower extremity: Secondary | ICD-10-CM | POA: Diagnosis not present

## 2016-02-17 NOTE — Progress Notes (Signed)
Gloria Grimes, Gloria Grimes ([id number]) Visit Report for 02/16/2016 Chief Complaint Document Details Patient Name: Gloria Grimes, Gloria Grimes Date of Service: 02/16/2016 3:45 PM Medical Record Patient Account Number: [id number] Number: Treating RN: Baruch Gouty, RN, BSN, Rita [birth year] [phone] y.o. Other Clinician: Date of Birth/Sex: Female) Treating Salvador Coupe Primary Care Physician/Extender: Curly Rim Physician: Referring Physician: Payton Doughty in Treatment: 2 Information Obtained from: Patient Chief Complaint 02/02/16; patient is here for review of a traumatic wound on her right lower leg Electronic Signature(s) Signed: 02/16/2016 4:24:47 PM By: Linton Ham MD Entered By: Linton Ham on 02/16/2016 16:13:14 Gloria Grimes ([id number]) -------------------------------------------------------------------------------- Debridement Details Patient Name: Gloria Grimes Date of Service: 02/16/2016 3:45 PM Medical Record Patient Account Number: [id number] Number: Treating RN: Baruch Gouty, RN, BSN, Rita 1930-12-08 [phone] y.o. Other Clinician: Date of Birth/Sex: Female) Treating Makenize Messman Primary Care Physician/Extender: Curly Rim Physician: Referring Physician: Payton Doughty in Treatment: 2 Debridement Performed for Wound #1 Right,Anterior Lower Leg Assessment: Performed By: Physician Ricard Dillon, MD Debridement: Debridement Pre-procedure Yes - 15:56 Verification/Time Out Taken: Start Time: 15:56 Pain Control: Lidocaine 4% Topical Solution Level: Skin/Subcutaneous Tissue Total Area Debrided (L x 1 (cm) x 1 (cm) = 1 (cm) W): Tissue and other Non-Viable, Exudate, Fibrin/Slough, Subcutaneous material debrided: Instrument: Curette Bleeding: Minimum Hemostasis Achieved: Pressure End Time: 16:00 Procedural Pain: 0 Post Procedural Pain: 0 Response to Treatment: Procedure was tolerated well Post Debridement Measurements of Total  Wound Length: (cm) 1 Width: (cm) 1 Depth: (cm) 0.3 Volume: (cm) 0.236 Character of Wound/Ulcer Post Requires Further Debridement Debridement: Severity of Tissue Post Debridement: Fat layer exposed Post Procedure Diagnosis Same as Pre-procedure Electronic Signature(s) Signed: 02/16/2016 4:24:47 PM By: Linton Ham MD Gloria Grimes ([id number]) Signed: 02/16/2016 5:37:17 PM By: Regan Lemming BSN, RN Entered By: Linton Ham on 02/16/2016 16:12:48 Gloria Grimes ([id number]) -------------------------------------------------------------------------------- HPI Details Patient Name: Gloria Grimes Date of Service: 02/16/2016 3:45 PM Medical Record Patient Account Number: [id number] Number: Treating RN: Baruch Gouty, RN, BSN, Rita 1931-05-03 [phone] y.o. 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 She lives at home with her husband who is had recent health problems himself. In any case she was making a bed in her home 2 weeks ago and managed to traumatized her right lower leg on the handle of her reclining chair. Per the patient's description this was a punched-out wound over they made some attempt to suture this together with 7 sutures. She was given a course of oral antibiotics. Her primary physician remove the sutures 7 days later however the wound had already dehisced. Since then she's been using antibiotic ointment and nonadherent pad and some form of wrap applied at an urgent care. She has no prior wound history. She is receiving to rounds of oral antibiotics finishing today. She is not a diabetic and is a long standing ex-smoker. Her only other primary  medical diagnosis is congestive heart failure. The ABI's in this clinic was 0.7 on the right 0.8 on the left 02/09/16; she arrives today with not much change in the wound surface. Still an adherent slough although this is better than last week. Clearly the decision to use Prisma on this was not the right decision, we'll change to Santyl this week.  02/16/16; the wound is smaller still with some depth with adherent slough. Using [employer]) Signed: 02/16/2016 4:24:47 PM By: Linton Ham MD Entered By: Linton Ham on 02/16/2016 16:13:49 Gloria Grimes ([id number]) -------------------------------------------------------------------------------- Physical Exam Details Patient Name: Gloria Grimes Date of Service: 02/16/2016 3:45 PM Medical Record Patient Account Number: [id number] Number: Treating RN: Baruch Gouty, RN, BSN, Rita 07-07-1930 [phone] y.o. Other Clinician: Date of Birth/Sex: Female) Treating Dai Mcadams Primary Care Physician/Extender: Curly Rim Physician: Referring Physician: Payton Doughty in Treatment: 2 Constitutional Patient is hypertensive.. Pulse regular and within target range for patient.Marland Kitchen Respirations regular, non-labored and within target range.. Temperature is normal and within the target range for the patient.. Patient's appearance is neat and clean. Appears in no acute distress. Well nourished and well developed.. Cardiovascular Pedal pulses palpable and strong bilaterally.. Notes Wound exam; wound measures smaller. Still an adherent surface on this that requires debridement that. This still has some depth. Post debridement the granulation looks very healthy. There is no evidence of surrounding infection or ischemia Electronic Signature(s) Signed: 02/16/2016 4:24:47 PM By: Linton Ham MD Entered By: Linton Ham on 02/16/2016 16:15:21 Gloria Grimes  ([id number]) -------------------------------------------------------------------------------- Physician Orders Details Patient Name: Gloria Grimes Date of Service: 02/16/2016 3:45 PM Medical Record Patient Account Number: [id number] Number: Treating RN: Baruch Gouty, RN, BSN, Rita July 23, 1930 [phone] y.o. Other Clinician: Date of Birth/Sex: Female) Treating Matasha Smigelski Primary Care Physician/Extender: Curly Rim Physician: Referring Physician: Payton Doughty in Treatment: 2 Verbal / Phone Orders: Yes Clinician: Afful, RN, BSN, Rita Read Back and Verified: Yes Diagnosis Coding Wound Cleansing Wound #1 Right,Anterior Lower Leg o Cleanse wound with mild soap and water o May shower with protection. o No tub bath. Anesthetic Wound #1 Right,Anterior Lower Leg o Topical Lidocaine 4% cream applied to wound bed prior to debridement Skin Barriers/Peri-Wound Care Wound #1 Right,Anterior Lower Leg o Barrier cream o Moisturizing lotion o Other: - TCA Primary Wound Dressing Wound #1 Right,Anterior Lower Leg o Santyl Ointment Secondary Dressing Wound #1 Right,Anterior Lower Leg o ABD pad o Dry Gauze - charcoal Dressing Change Frequency Wound #1 Right,Anterior Lower Leg o Change dressing every week Follow-up Appointments Wound #1 Right,Anterior Lower Leg o Return Appointment in 1 week. Gloria Grimes, Gloria Grimes ([id number]) Edema Control Wound #1 Right,Anterior Lower Leg o Elevate legs to the level of the heart and pump ankles as often as possible o Other: - Kerlix and Coban unna to anchor Additional Orders / Instructions Wound #1 Right,Anterior Lower Leg o Increase protein intake. Electronic Signature(s) Signed: 02/16/2016 4:24:47 PM By: Linton Ham MD Signed: 02/16/2016 5:37:17 PM By: Regan Lemming BSN, RN Entered By: Regan Lemming on 02/16/2016 16:03:07 Gloria Grimes, Gloria Grimes  ([id number]) -------------------------------------------------------------------------------- Problem List Details Patient Name: Gloria Grimes Date of Service: 02/16/2016 3:45 PM Medical Record Patient Account Number: [id number] Number: Treating RN: Baruch Gouty, RN, BSN, Rita 01-13-1931 [phone] y.o. Other Clinician: Date of Birth/Sex: Female) Treating Mellie Buccellato Primary Care Physician/Extender: Curly Rim Physician: Referring Physician: Payton Doughty in Treatment: 2 Active Problems ICD-10 Encounter Code Description Active Date Diagnosis S81.811S Laceration without foreign body, right lower leg, sequela 02/02/2016 Yes I87.301 Chronic venous hypertension (idiopathic) without 0000000 Yes complications of right lower extremity Inactive Problems Resolved Problems Electronic Signature(s) Signed: 02/16/2016 4:24:47 PM By: Linton Ham MD Entered By: Linton Ham on 02/16/2016 16:12:32 Gloria Grimes ([id number]) -------------------------------------------------------------------------------- Progress Note Details Patient Name: Gloria Grimes Date of Service: 02/16/2016 3:45 PM Medical Record Patient  Account Number: [id number] Number: Treating RN: Baruch Gouty, RN, BSN, Rita 14-Apr-1931 [phone] y.o. 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 She lives at home with her husband who is had recent health problems himself. In any case she was making a bed in her home 2 weeks ago and managed to traumatized her right lower  leg on the handle of her reclining chair. Per the patient's description this was a punched-out wound over they made some attempt to suture this together with 7 sutures. She was given a course of oral antibiotics. Her primary physician remove the sutures 7 days later however the wound had already dehisced. Since then she's been using antibiotic ointment and nonadherent pad and some form of wrap applied at an urgent care. She has no prior wound history. She is receiving to rounds of oral antibiotics finishing today. She is not a diabetic and is a long standing ex-smoker. Her only other primary medical diagnosis is congestive heart failure. The ABI's in this clinic was 0.7 on the right 0.8 on the left 02/09/16; she arrives today with not much change in the wound surface. Still an adherent slough although this is better than last week. Clearly the decision to use Prisma on this was not the right decision, we'll change to Santyl this week. 02/16/16; the wound is smaller still with some depth with adherent slough. Using Santyl Objective Constitutional Patient is hypertensive.. Pulse regular and within target range for patient.Marland Kitchen Respirations regular, non-labored and within target range.. Temperature is normal and within the target range for the patient.. Patient's appearance is neat and clean. Appears in no acute distress. Well nourished and well developed.. Vitals Time Taken: 3:52 PM, Height: 62 in, Weight: 156.9 lbs, BMI: 28.7, Temperature: 98.5 F, Pulse: 97 bpm, Respiratory Rate: 18 breaths/min, Blood Pressure: 151/63 mmHg. Gloria Grimes, Gloria Grimes ([id number]) Cardiovascular Pedal pulses palpable and strong bilaterally.. General Notes: Wound exam; wound measures smaller. Still an adherent surface on this that requires debridement that. This still has some depth. Post debridement the granulation looks very healthy. There is no evidence of surrounding infection or ischemia Integumentary (Hair,  Skin) Wound #1 status is Open. Original cause of wound was Trauma. The wound is located on the Right,Anterior Lower Leg. The wound measures 1cm length x 1cm width x 0.3cm depth; 0.785cm^2 area and 0.236cm^3 volume. The wound is limited to skin breakdown. There is no tunneling or undermining noted. There is a large amount of serosanguineous drainage noted. The wound margin is distinct with the outline attached to the wound base. There is small (1-33%) red granulation within the wound bed. There is a large (67-100%) amount of necrotic tissue within the wound bed including Eschar and Adherent Slough. The periwound skin appearance exhibited: Localized Edema, Maceration, Moist, Erythema. The surrounding wound skin color is noted with erythema which is circumferential. Periwound temperature was noted as No Abnormality. The periwound has tenderness on palpation. Assessment Active Problems ICD-10 S81.811S - Laceration without foreign body, right lower leg, sequela I87.301 - Chronic venous hypertension (idiopathic) without complications of right lower extremity Procedures Wound #1 Wound #1 is a Trauma, Other located on the Scalp Level  Leg . There was a Skin/Subcutaneous Tissue Debridement [id number]) debridement with total area of 1 sq cm performed by Ricard Dillon, MD. with the following instrument(s): Curette to remove Non-Viable tissue/material including Exudate, Fibrin/Slough, and Subcutaneous after achieving pain control using Lidocaine 4% Topical Solution. A time out was conducted at 15:56, prior to the start of the procedure. A Minimum amount of bleeding was controlled with Pressure. The procedure was tolerated well with a pain level of 0 throughout and a pain level of 0 following the procedure. Post Debridement Measurements: 1cm length x 1cm width x 0.3cm depth; 0.236cm^3 volume. Character of Wound/Ulcer Post Debridement requires further debridement. Severity of Tissue  Post Debridement is: Fat layer exposed. Post procedure Diagnosis Wound #1: Same as Pre-Procedure Gloria Grimes, Gloria Grimes ([id number]) Plan Wound Cleansing: Wound #1 Right,Anterior Lower Leg: Cleanse wound with mild soap and water May shower with protection. No tub bath. Anesthetic: Wound #1 Right,Anterior Lower Leg: Topical Lidocaine 4% cream applied to wound bed prior to debridement Skin Barriers/Peri-Wound Care: Wound #1 Right,Anterior Lower Leg: Barrier cream Moisturizing lotion Other: - TCA Primary Wound Dressing: Wound #1 Right,Anterior Lower Leg: Santyl Ointment Secondary Dressing: Wound #1 Right,Anterior Lower Leg: ABD pad Dry Gauze - charcoal Dressing Change Frequency: Wound #1 Right,Anterior Lower Leg: Change dressing every week Follow-up Appointments: Wound #1 Right,Anterior Lower Leg: Return Appointment in 1 week. Edema Control: Wound #1 Right,Anterior Lower Leg: Elevate legs to the level of the heart and pump ankles as often as possible Other: - Kerlix and Coban unna to anchor Additional Orders / Instructions: Wound #1 Right,Anterior Lower Leg: Increase protein intake. #1 I continue the Santyl based dressings, gauze, Kerlix and Coban Gloria Grimes, Gloria Grimes ([id number]) Electronic Signature(s) Signed: 02/16/2016 4:24:47 PM By: Linton Ham MD Entered By: Linton Ham on 02/16/2016 16:16:12 Gloria Grimes ([id number]) -------------------------------------------------------------------------------- SuperBill Details Patient Name: Gloria Grimes Date of Service: 02/16/2016 Medical Record Patient Account Number: [id number] Number: Treating RN: Baruch Gouty, RN, BSN, Rita [birth year] [phone] y.o. Other Clinician: Date of Birth/Sex: Female) Treating Mihailo Sage Primary Care Physician/Extender: Curly Rim Physician: Suella Grove in Treatment: 2 Referring Physician: [place] Pink Diagnosis Coding ICD-10 Codes Code Description [phone] Laceration without  foreign body, right lower leg, sequela I87.301 Chronic venous hypertension (idiopathic) without complications of right lower extremity Facility Procedures CPT4 Code: [id number] Description: [id number] - DEB SUBQ TISSUE 20 SQ CM/< ICD-10 Description Diagnosis S81.811S Laceration without foreign body, right lower leg, Modifier: sequela Quantity: 1 Physician Procedures CPT4 Code: [id number] Description: [id number] - WC PHYS SUBQ TISS 20 SQ CM ICD-10 Description Diagnosis [id number] Laceration without foreign body, right lower leg, Modifier: sequela Quantity: 1 Electronic Signature(s) Signed: 02/16/2016 4:24:47 PM By: Linton Ham MD Entered By: Linton Ham on 02/16/2016 16:16:38

## 2016-02-17 NOTE — Progress Notes (Signed)
Gloria, Grimes ([id number]) Visit Report for 02/16/2016 Arrival Information Details Patient Name: Gloria Grimes, Gloria Grimes Date of Service: 02/16/2016 3:45 PM Medical Record Patient Account Number: [id number] Number: Treating RN: Baruch Gouty, RN, BSN, Rita June 13, 1930 [phone] y.o. Other Clinician: Date of Birth/Sex: Female) Treating ROBSON, MICHAEL Primary Care Physician: [place] Pink Physician/Extender: G Referring Physician: Payton Doughty in Treatment: 2 Visit Information History Since Last Visit All ordered tests and consults were completed: No Patient Arrived: Ambulatory Added or deleted any medications: No Arrival Time: 15:51 Any new allergies or adverse reactions: No Accompanied By: self Had a fall or experienced change in No Transfer Assistance: None activities of daily living that may affect Patient Identification Verified: Yes risk of falls: Secondary Verification Process Yes Signs or symptoms of abuse/neglect since last No Completed: visito Patient Requires Transmission-Based No Hospitalized since last visit: No Precautions: Has Dressing in Place as Prescribed: Yes Patient Has Alerts: No Has Compression in Place as Prescribed: Yes Pain Present Now: No Electronic Signature(s) Signed: 02/16/2016 5:37:17 PM By: Regan Lemming BSN, RN Entered By: Regan Lemming on 02/16/2016 15:52:15 Gloria Grimes ([id number]) -------------------------------------------------------------------------------- Encounter Discharge Information Details Patient Name: Gloria Grimes Date of Service: 02/16/2016 3:45 PM Medical Record Patient Account Number: [id number] Number: Treating RN: Baruch Gouty, RN, BSN, Rita 1930-10-23 [phone] y.o. Other Clinician: Date of Birth/Sex: Female) Treating ROBSON, MICHAEL Primary Care Physician: [place] Pink Physician/Extender: G Referring Physician: Payton Doughty in Treatment: 2 Encounter Discharge Information Items Schedule Follow-up  Appointment: No Medication Reconciliation completed No and provided to Patient/Care Jymir Dunaj: Provided on Clinical Summary of Care: 02/16/2016 Form Type Recipient Paper Patient JF Electronic Signature(s) Signed: 02/16/2016 4:14:40 PM By: Ruthine Dose Entered By: Ruthine Dose on 02/16/2016 16:14:39 Gloria Grimes ([id number]) -------------------------------------------------------------------------------- Lower Extremity Assessment Details Patient Name: Gloria Grimes Date of Service: 02/16/2016 3:45 PM Medical Record Patient Account Number: [id number] Number: Treating RN: Baruch Gouty RN, BSN, Rita 22-May-1931 [phone] y.o. Other Clinician: Date of Birth/Sex: Female) Treating ROBSON, MICHAEL Primary Care Physician: [place] Physician/Extender: G Referring Physician: Payton Doughty in Treatment: 2 Edema Assessment Assessed: [Left: No] [Right: No] Edema: [Left: Ye] [Right: s] Calf Left: Right: Point of Measurement: 28 cm From Medial Instep cm 35.6 cm Ankle Left: Right: Point of Measurement: 10 cm From Medial Instep cm 22.4 cm Vascular Assessment Claudication: Claudication Assessment [Right:None] Pulses: Posterior Tibial Dorsalis Pedis Palpable: [Right:Yes] Extremity colors, hair growth, and conditions: Extremity Color: [Right:Mottled] Hair Growth on Extremity: [Right:No] Temperature of Extremity: [Right:Warm] Capillary Refill: [Right:< 3 seconds] Toe Nail Assessment Left: Right: Thick: No Discolored: No Deformed: No Improper Length and Hygiene: No Electronic Signature(s) Signed: 02/16/2016 5:37:17 PM By: Regan Lemming BSN, RN [address], [place] ([id number]) Entered By: Regan Lemming on 02/16/2016 15:53:28 Gloria Grimes ([id number]) -------------------------------------------------------------------------------- Multi Wound Chart Details Patient Name: Gloria Grimes Date of Service: 02/16/2016 3:45 PM Medical Record Patient Account Number:  [id number] Number: Treating RN: Baruch Gouty, RN, BSN, Rita 1930/11/05 [phone] y.o. Other Clinician: Date of Birth/Sex: Female) Treating ROBSON, Gloria Grimes Primary Care Physician: [place] Physician/Extender: G Referring Physician: Payton Doughty in Treatment: 2 Vital Signs Height(in): 62 Pulse(bpm): 97 Weight(lbs): 156.9 Blood Pressure 151/63 (mmHg): Body Mass Index(BMI): 29 Temperature(F): 98.5 Respiratory Rate 18 (breaths/min): Photos: [1:No Photos] [N/A:N/A] Wound Location: [1:Right Lower Leg - Anterior N/A] Wounding Event: [1:Trauma] [N/A:N/A] Primary Etiology: [1:Trauma, Other] [N/A:N/A] Comorbid History: [1:Congestive Heart Failure, N/A Hypertension] Date Acquired: [1:01/19/2016] [N/A:N/A] Weeks of Treatment: [1:2] [N/A:N/A] Wound Status: [1:Open] [N/A:N/A] Measurements L x W  x D 1x1x0.3 [N/A:N/A] (cm) Area (cm) : [1:0.785] [N/A:N/A] Volume (cm) : [1:0.236] [N/A:N/A] % Reduction in Area: [1:73.60%] [N/A:N/A] % Reduction in Volume: 73.50% [N/A:N/A] Classification: [1:Partial Thickness] [N/A:N/A] Exudate Amount: [1:Large] [N/A:N/A] Exudate Type: [1:Serosanguineous] [N/A:N/A] Exudate Color: [1:red, brown] [N/A:N/A] Wound Margin: [1:Distinct, outline attached N/A] Granulation Amount: [1:Small (1-33%)] [N/A:N/A] Granulation Quality: [1:Red] [N/A:N/A] Necrotic Amount: [1:Large (67-100%)] [N/A:N/A] Necrotic Tissue: [1:Eschar, Adherent Slough N/A] Exposed Structures: [1:Fascia: No Fat: No Tendon: No Muscle: No] [N/A:N/A] Joint: No Bone: No Limited to Skin Breakdown Epithelialization: Small (1-33%) N/A N/A Periwound Skin Texture: Edema: Yes N/A N/A Periwound Skin Maceration: Yes N/A N/A Moisture: Moist: Yes Periwound Skin Color: Erythema: Yes N/A N/A Erythema Location: Circumferential N/A N/A Temperature: No Abnormality N/A N/A Tenderness on Yes N/A N/A Palpation: Wound Preparation: Ulcer Cleansing: N/A N/A Rinsed/Irrigated with Saline Topical  Anesthetic Applied: Other: lidocaine 4% Treatment Notes Electronic Signature(s) Signed: 02/16/2016 5:37:17 PM By: Regan Lemming BSN, RN Entered By: Regan Lemming on 02/16/2016 16:01:31 BERNITA, COOLIDGE ([id number]) -------------------------------------------------------------------------------- [place] Details Patient Name: Gloria Grimes Date of Service: 02/16/2016 3:45 PM Medical Record Patient Account Number: [id number] Number: Treating RN: Baruch Gouty, RN, BSN, Rita Jun 13, 1930 [phone] y.o. Other Clinician: Date of Birth/Sex: Female) Treating ROBSON, [place] Primary Care Physician: [place] Pink Physician/Extender: G Referring Physician: Payton Doughty in Treatment: 2 Active Inactive Nutrition Nursing Diagnoses: Imbalanced nutrition Goals: Patient/caregiver agrees to and verbalizes understanding of need to use nutritional supplements and/or vitamins as prescribed Date Initiated: 02/02/2016 Goal Status: Active Interventions: Assess patient nutrition upon admission and as needed per policy Notes: Orientation to the Wound Care Program Nursing Diagnoses: Knowledge deficit related to the wound healing center program Goals: Patient/caregiver will verbalize understanding of the [facility] Date Initiated: 02/02/2016 Goal Status: Active Interventions: Provide education on orientation to the wound center Notes: Pain, Acute or Chronic Nursing Diagnoses: Pain, acute or chronic: actual or potential Potential alteration in comfort, pain LEELAH, BARNHARD ([id number]) Goals: Patient will verbalize adequate pain control and receive pain control interventions during procedures as needed Date Initiated: 02/02/2016 Goal Status: Active Interventions: Assess comfort goal upon admission Complete pain assessment as per visit requirements Notes: Soft Tissue Infection Nursing Diagnoses: Impaired tissue integrity Goals: Patient/caregiver will  verbalize understanding of or measures to prevent infection and contamination in the home setting Date Initiated: 02/02/2016 Goal Status: Active Patient's soft tissue infection will resolve Date Initiated: 02/02/2016 Goal Status: Active Interventions: Assess signs and symptoms of infection every visit Provide education on infection Treatment Activities: Education provided on Infection : 02/02/2016 Notes: Wound/Skin Impairment Nursing Diagnoses: Impaired tissue integrity Goals: Ulcer/skin breakdown will have a volume reduction of 30% by week 4 Date Initiated: 02/02/2016 Goal Status: Active Ulcer/skin breakdown will have a volume reduction of 50% by week 8 Date Initiated: 02/02/2016 Gloria Grimes ([id number]) Goal Status: Active Ulcer/skin breakdown will have a volume reduction of 80% by week 12 Date Initiated: 02/02/2016 Goal Status: Active Interventions: Assess patient/caregiver ability to obtain necessary supplies Assess ulceration(s) every visit Notes: Electronic Signature(s) Signed: 02/16/2016 5:37:17 PM By: Regan Lemming BSN, RN Entered By: Regan Lemming on 02/16/2016 16:01:24 Gloria Grimes ([id number]) -------------------------------------------------------------------------------- Pain Assessment Details Patient Name: Gloria Grimes Date of Service: 02/16/2016 3:45 PM Medical Record Patient Account Number: [id number] Number: Treating RN: Baruch Gouty, RN, BSN, Rita Apr 22, 1931 [phone] y.o. Other Clinician: Date of Birth/Sex: Female) Treating ROBSON, MICHAEL Primary Care Physician: [place] Pink Physician/Extender: G Referring Physician: Payton Doughty in Treatment: 2 Active Problems Location  of Pain Severity and Description of Pain Patient Has Paino No Site Locations With Dressing Change: No Pain Management and Medication Current Pain Management: Electronic Signature(s) Signed: 02/16/2016 5:37:17 PM By: Regan Lemming BSN, RN Entered By: Regan Lemming on 02/16/2016  15:52:22 Gloria Grimes ([id number]) -------------------------------------------------------------------------------- Patient/Caregiver Education Details Patient Name: Gloria Grimes Date of Service: 02/16/2016 3:45 PM Medical Record Patient Account Number: [id number] Number: Treating RN: Baruch Gouty, RN, BSN, Rita 27-Jun-1930 [phone] y.o. Other Clinician: Date of Birth/Gender: Female) Treating ROBSON, MICHAEL Primary Care Physician: [place] Pink Physician/Extender: G Referring Physician: Payton Doughty in Treatment: 2 Education Assessment Education Provided To: Patient Education Topics Provided Infection: Methods: Explain/Verbal Responses: State content correctly Welcome To [facility]: Methods: Explain/Verbal Responses: State content correctly Wound Debridement: Methods: Explain/Verbal Responses: State content correctly Wound/Skin Impairment: Methods: Explain/Verbal Responses: State content correctly Electronic Signature(s) Signed: 02/16/2016 5:37:17 PM By: Regan Lemming BSN, RN Entered By: Regan Lemming on 02/16/2016 17:04:03 Gloria Grimes ([id number]) -------------------------------------------------------------------------------- Wound Assessment Details Patient Name: Gloria Grimes Date of Service: 02/16/2016 3:45 PM Medical Record Patient Account Number: [id number] Number: Treating RN: Baruch Gouty, RN, BSN, Rita 07/14/1930 [phone] y.o. Other Clinician: Date of Birth/Sex: Female) Treating ROBSON, [place] Primary Care Physician: [place] Pink Physician/Extender: G Referring Physician: Payton Doughty in Treatment: 2 Wound Status Wound Number: 1 Primary Trauma, Other Etiology: Wound Location: Right Lower Leg - Anterior Wound Status: Open Wounding Event: Trauma Comorbid Congestive Heart Failure, Date Acquired: 01/19/2016 History: Hypertension Weeks Of Treatment: 2 Clustered Wound: No Photos Photo Uploaded By: Regan Lemming on  02/16/2016 16:51:25 Wound Measurements Length: (cm) 1 Width: (cm) 1 Depth: (cm) 0.3 Area: (cm) 0.785 Volume: (cm) 0.236 % Reduction in Area: 73.6% % Reduction in Volume: 73.5% Epithelialization: Small (1-33%) Tunneling: No Undermining: No Wound Description Classification: Partial Thickness Wound Margin: Distinct, outline attached Exudate Amount: Large Exudate Type: Serosanguineous Exudate Color: red, brown Foul Odor After Cleansing: No Wound Bed Granulation Amount: Small (1-33%) Exposed Structure Granulation Quality: Red Fascia Exposed: No Necrotic Amount: Large (67-100%) Fat Layer Exposed: No ATIANA, MALICK ([id number]) Necrotic Quality: Eschar, Adherent Slough Tendon Exposed: No Muscle Exposed: No Joint Exposed: No Bone Exposed: No Limited to Skin Breakdown Periwound Skin Texture Texture Color No Abnormalities Noted: No No Abnormalities Noted: No Localized Edema: Yes Erythema: Yes Erythema Location: Circumferential Moisture No Abnormalities Noted: No Temperature / Pain Maceration: Yes Temperature: No Abnormality Moist: Yes Tenderness on Palpation: Yes Wound Preparation Ulcer Cleansing: Rinsed/Irrigated with Saline Topical Anesthetic Applied: Other: lidocaine 4%, Treatment Notes Wound #1 (Right, Anterior Lower Leg) 1. Cleansed with: Cleanse wound with antibacterial soap and water 4. Dressing Applied: Santyl Ointment 5. Secondary Dressing Applied Bordered Foam Dressing Dry Gauze 7. Secured with 2 Layer Lite Compression System - Right Lower Extremity Notes kerlix, coban, Electronic Signature(s) Signed: 02/16/2016 5:37:17 PM By: Regan Lemming BSN, RN Entered By: Regan Lemming on 02/16/2016 16:00:50 Gloria Grimes ([id number]) -------------------------------------------------------------------------------- Vitals Details Patient Name: Gloria Grimes Date of Service: 02/16/2016 3:45 PM Medical Record Patient Account Number:  [id number] Number: Treating RN: Baruch Gouty, RN, BSN, Rita 1930/08/25 [phone] y.o. Other Clinician: Date of Birth/Sex: Female) Treating ROBSON, [place] Primary Care Physician: [place] Pink Physician/Extender: G Referring Physician: Payton Doughty in Treatment: 2 Vital Signs Time Taken: 15:52 Temperature (F): 98.5 Height (in): 62 Pulse (bpm): 97 Weight (lbs): 156.9 Respiratory Rate (breaths/min): 18 Body Mass Index (BMI): 28.7 Blood Pressure (mmHg): 151/63 Reference Range: 80 - 120 mg / dl Electronic Signature(s) Signed: 02/16/2016 5:37:17  PM By: Regan Lemming BSN, RN Entered By: Regan Lemming on 02/16/2016 15:52:41

## 2016-02-22 ENCOUNTER — Encounter: Payer: Medicare Other | Admitting: Internal Medicine

## 2016-02-22 DIAGNOSIS — I87301 Chronic venous hypertension (idiopathic) without complications of right lower extremity: Secondary | ICD-10-CM | POA: Diagnosis not present

## 2016-02-23 NOTE — Progress Notes (Signed)
Gloria, Grimes ([id number]) Visit Report for 02/22/2016 Arrival Information Details Patient Name: Gloria Grimes, Gloria Grimes Date of Service: 02/22/2016 3:00 PM Medical Record Patient Account Number: [id number] Number: Treating RN: Montey Hora [birth year] (82 y.o. Other Clinician: Date of Birth/Sex: Female) Treating ROBSON, [place] Primary Care Physician: [place] Pink Physician/Extender: G Referring Physician: Payton Doughty in Treatment: 2 Visit Information History Since Last Visit Added or deleted any medications: No Patient Arrived: Ambulatory Any new allergies or adverse reactions: No Arrival Time: 15:46 Had a fall or experienced change in No Accompanied By: self activities of daily living that may affect Transfer Assistance: None risk of falls: Patient Identification Verified: Yes Signs or symptoms of abuse/neglect since last No Secondary Verification Process Yes visito Completed: Hospitalized since last visit: No Patient Requires Transmission-Based No Pain Present Now: No Precautions: Patient Has Alerts: No Electronic Signature(s) Signed: 02/22/2016 5:05:30 PM By: Montey Hora Entered By: Montey Hora on 02/22/2016 15:46:41 Gloria Grimes ([id number]) -------------------------------------------------------------------------------- Clinic Level of Care Assessment Details Patient Name: Gloria Grimes Date of Service: 02/22/2016 3:00 PM Medical Record Patient Account Number: [id number] Number: Treating RN: Montey Hora 06/20/1930 (82 y.o. Other Clinician: Date of Birth/Sex: Female) Treating ROBSON, [place] Primary Care Physician: [place] Pink Physician/Extender: G Referring Physician: Payton Doughty in Treatment: 2 Clinic Level of Care Assessment Items TOOL 4 Quantity Score []  - Use when only an EandM is performed on FOLLOW-UP visit 0 ASSESSMENTS - Nursing Assessment / Reassessment X - Reassessment of Co-morbidities (includes  updates in patient status) 1 10 X - Reassessment of Adherence to Treatment Plan 1 5 ASSESSMENTS - Wound and Skin Assessment / Reassessment X - Simple Wound Assessment / Reassessment - one wound 1 5 []  - Complex Wound Assessment / Reassessment - multiple wounds 0 []  - Dermatologic / Skin Assessment (not related to wound area) 0 ASSESSMENTS - Focused Assessment X - Circumferential Edema Measurements - multi extremities 1 5 []  - Nutritional Assessment / Counseling / Intervention 0 X - Lower Extremity Assessment (monofilament, tuning fork, pulses) 1 5 []  - Peripheral Arterial Disease Assessment (using hand held doppler) 0 ASSESSMENTS - Ostomy and/or Continence Assessment and Care []  - Incontinence Assessment and Management 0 []  - Ostomy Care Assessment and Management (repouching, etc.) 0 PROCESS - Coordination of Care X - Simple Patient / Family Education for ongoing care 1 15 []  - Complex (extensive) Patient / Family Education for ongoing care 0 []  - Staff obtains Programmer, systems, Records, Test Results / Process Orders 0 []  - Staff telephones HHA, Nursing Homes / Clarify orders / etc 0 KAELY, HARTMAN. ([id number]) []  - Routine Transfer to another Facility (non-emergent condition) 0 []  - Routine Hospital Admission (non-emergent condition) 0 []  - New Admissions / Biomedical engineer / Ordering NPWT, Apligraf, etc. 0 []  - Emergency Hospital Admission (emergent condition) 0 X - Simple Discharge Coordination 1 10 []  - Complex (extensive) Discharge Coordination 0 PROCESS - Special Needs []  - Pediatric / Minor Patient Management 0 []  - Isolation Patient Management 0 []  - Hearing / Language / Visual special needs 0 []  - Assessment of Community assistance (transportation, D/C planning, etc.) 0 []  - Additional assistance / Altered mentation 0 []  - Support Surface(s) Assessment (bed, cushion, seat, etc.) 0 INTERVENTIONS - Wound Cleansing / Measurement X - Simple Wound Cleansing - one wound 1 5 []   - Complex Wound Cleansing - multiple wounds 0 X - Wound Imaging (photographs - any number of wounds) 1 5 []  - Wound Tracing (instead of  photographs) 0 X - Simple Wound Measurement - one wound 1 5 []  - Complex Wound Measurement - multiple wounds 0 INTERVENTIONS - Wound Dressings []  - Small Wound Dressing one or multiple wounds 0 []  - Medium Wound Dressing one or multiple wounds 0 X - Large Wound Dressing one or multiple wounds 1 20 []  - Application of Medications - topical 0 []  - Application of Medications - injection 0 JAMIESON, HODGMAN. ([id number]) INTERVENTIONS - Miscellaneous []  - External ear exam 0 []  - Specimen Collection (cultures, biopsies, blood, body fluids, etc.) 0 []  - Specimen(s) / Culture(s) sent or taken to Lab for analysis 0 []  - Patient Transfer (multiple staff / Harrel Lemon Lift / Similar devices) 0 []  - Simple Staple / Suture removal (25 or less) 0 []  - Complex Staple / Suture removal (26 or more) 0 []  - Hypo / Hyperglycemic Management (close monitor of Blood Glucose) 0 []  - Ankle / Brachial Index (ABI) - do not check if billed separately 0 X - Vital Signs 1 5 Has the patient been seen at the hospital within the last three years: Yes Total Score: 95 Level Of Care: New/Established - Level 3 Electronic Signature(s) Signed: 02/22/2016 5:05:30 PM By: Montey Hora Entered By: Montey Hora on 02/22/2016 17:03:14 Gloria Grimes ([id number]) -------------------------------------------------------------------------------- Encounter Discharge Information Details Patient Name: Gloria Grimes Date of Service: 02/22/2016 3:00 PM Medical Record Patient Account Number: [id number] Number: Treating RN: Montey Hora [birth year] (82 y.o. Other Clinician: Date of Birth/Sex: Female) Treating ROBSON, MICHAEL Primary Care Physician: [place] Pink Physician/Extender: G Referring Physician: Payton Doughty in Treatment: 2 Encounter Discharge Information  Items Discharge Pain Level: 0 Discharge Condition: Stable Ambulatory Status: Ambulatory Discharge Destination: Home Transportation: Private Auto Accompanied By: self Schedule Follow-up Appointment: Yes Medication Reconciliation completed and provided to Patient/Care No Keshav Winegar: Provided on Clinical Summary of Care: 02/22/2016 Form Type Recipient Paper Patient JF Electronic Signature(s) Signed: 02/22/2016 5:04:13 PM By: Montey Hora Previous Signature: 02/22/2016 4:29:19 PM Version By: Ruthine Dose Entered By: Montey Hora on 02/22/2016 17:04:13 Gloria Grimes ([id number]) -------------------------------------------------------------------------------- Lower Extremity Assessment Details Patient Name: Gloria Grimes Date of Service: 02/22/2016 3:00 PM Medical Record Patient Account Number: [id number] Number: Treating RN: Montey Hora Jul 06, 1930 (82 y.o. Other Clinician: Date of Birth/Sex: Female) Treating ROBSON, MICHAEL Primary Care Physician: [place] Pink Physician/Extender: G Referring Physician: Payton Doughty in Treatment: 2 Edema Assessment Assessed: [Left: No] [Right: No] E[Left: dema] [Right: :] Calf Left: Right: Point of Measurement: 28 cm From Medial Instep cm 35.2 cm Ankle Left: Right: Point of Measurement: 10 cm From Medial Instep cm 22.2 cm Vascular Assessment Pulses: Posterior Tibial Dorsalis Pedis Palpable: [Right:Yes] Extremity colors, hair growth, and conditions: Extremity Color: [Right:Normal] Hair Growth on Extremity: [Right:No] Temperature of Extremity: [Right:Warm] Capillary Refill: [Right:< 3 seconds] Electronic Signature(s) Signed: 02/22/2016 5:05:30 PM By: Montey Hora Entered By: Montey Hora on 02/22/2016 16:13:24 Gloria Grimes ([id number]) -------------------------------------------------------------------------------- Multi Wound Chart Details Patient Name: Gloria Grimes Date of Service: 02/22/2016  3:00 PM Medical Record Patient Account Number: [id number] Number: Treating RN: Montey Hora 1931/05/16 (82 y.o. Other Clinician: Date of Birth/Sex: Female) Treating ROBSON, [place] Primary Care Physician: [place] Pink Physician/Extender: G Referring Physician: Payton Doughty in Treatment: 2 Vital Signs Height(in): 62 Pulse(bpm): 88 Weight(lbs): 156.9 Blood Pressure 163/61 (mmHg): Body Mass Index(BMI): 29 Temperature(F): 98.3 Respiratory Rate 18 (breaths/min): Photos: [N/A:N/A] Wound Location: Right Lower Leg - Anterior N/A N/A Wounding Event: Trauma N/A N/A Primary Etiology: Trauma, Other N/A  N/A Comorbid History: Congestive Heart Failure, N/A N/A Hypertension Date Acquired: 01/19/2016 N/A N/A Weeks of Treatment: 2 N/A N/A Wound Status: Open N/A N/A Measurements L x W x D 0.6x0.7x0.3 N/A N/A (cm) Area (cm) : 0.33 N/A N/A Volume (cm) : 0.099 N/A N/A % Reduction in Area: 88.90% N/A N/A % Reduction in Volume: 88.90% N/A N/A Classification: Partial Thickness N/A N/A Exudate Amount: Large N/A N/A Exudate Type: Serosanguineous N/A N/A Exudate Color: red, brown N/A N/A Wound Margin: Distinct, outline attached N/A N/A Granulation Amount: Small (1-33%) N/A N/A Granulation Quality: Red N/A N/A MAESYN, MOLLOY. ([id number]) Necrotic Amount: Large (67-100%) N/A N/A Necrotic Tissue: Eschar, Adherent Slough N/A N/A Exposed Structures: Fascia: No N/A N/A Fat: No Tendon: No Muscle: No Joint: No Bone: No Limited to Skin Breakdown Epithelialization: Small (1-33%) N/A N/A Periwound Skin Texture: Edema: Yes N/A N/A Periwound Skin Maceration: Yes N/A N/A Moisture: Moist: Yes Periwound Skin Color: Erythema: Yes N/A N/A Erythema Location: Circumferential N/A N/A Temperature: No Abnormality N/A N/A Tenderness on Yes N/A N/A Palpation: Wound Preparation: Ulcer Cleansing: N/A N/A Rinsed/Irrigated with Saline Topical Anesthetic Applied: Other:  lidocaine 4% Treatment Notes Electronic Signature(s) Signed: 02/22/2016 5:05:30 PM By: Montey Hora Entered By: Montey Hora on 02/22/2016 16:16:08 Gloria Grimes ([id number]) -------------------------------------------------------------------------------- [place] Details Patient Name: Gloria Grimes Date of Service: 02/22/2016 3:00 PM Medical Record Patient Account Number: [id number] Number: Treating RN: Montey Hora 09-Jun-1930 (82 y.o. Other Clinician: Date of Birth/Sex: Female) Treating ROBSON, [place] Primary Care Physician: [place] Pink Physician/Extender: G Referring Physician: Payton Doughty in Treatment: 2 Active Inactive Nutrition Nursing Diagnoses: Imbalanced nutrition Goals: Patient/caregiver agrees to and verbalizes understanding of need to use nutritional supplements and/or vitamins as prescribed Date Initiated: 02/02/2016 Goal Status: Active Interventions: Assess patient nutrition upon admission and as needed per policy Notes: Orientation to the Wound Care Program Nursing Diagnoses: Knowledge deficit related to the wound healing center program Goals: Patient/caregiver will verbalize understanding of the [facility] Date Initiated: 02/02/2016 Goal Status: Active Interventions: Provide education on orientation to the wound center Notes: Pain, Acute or Chronic Nursing Diagnoses: Pain, acute or chronic: actual or potential Potential alteration in comfort, pain MINDY, BRAUD ([id number]) Goals: Patient will verbalize adequate pain control and receive pain control interventions during procedures as needed Date Initiated: 02/02/2016 Goal Status: Active Interventions: Assess comfort goal upon admission Complete pain assessment as per visit requirements Notes: Soft Tissue Infection Nursing Diagnoses: Impaired tissue integrity Goals: Patient/caregiver will verbalize understanding of or measures  to prevent infection and contamination in the home setting Date Initiated: 02/02/2016 Goal Status: Active Patient's soft tissue infection will resolve Date Initiated: 02/02/2016 Goal Status: Active Interventions: Assess signs and symptoms of infection every visit Provide education on infection Treatment Activities: Education provided on Infection : 02/16/2016 Notes: Wound/Skin Impairment Nursing Diagnoses: Impaired tissue integrity Goals: Ulcer/skin breakdown will have a volume reduction of 30% by week 4 Date Initiated: 02/02/2016 Goal Status: Active Ulcer/skin breakdown will have a volume reduction of 50% by week 8 Date Initiated: 02/02/2016 Gloria Grimes ([id number]) Goal Status: Active Ulcer/skin breakdown will have a volume reduction of 80% by week 12 Date Initiated: 02/02/2016 Goal Status: Active Interventions: Assess patient/caregiver ability to obtain necessary supplies Assess ulceration(s) every visit Notes: Electronic Signature(s) Signed: 02/22/2016 5:05:30 PM By: Montey Hora Entered By: Montey Hora on 02/22/2016 16:16:01 Gloria Grimes ([id number]) -------------------------------------------------------------------------------- Pain Assessment Details Patient Name: Gloria Grimes Date of Service: 02/22/2016 3:00 PM Medical Record  Patient Account Number: [id number] Number: Treating RN: Montey Hora 13-Feb-1931 (82 y.o. Other Clinician: Date of Birth/Sex: Female) Treating ROBSON, MICHAEL Primary Care Physician: [place] Pink Physician/Extender: G Referring Physician: Payton Doughty in Treatment: 2 Active Problems Location of Pain Severity and Description of Pain Patient Has Paino No Site Locations Pain Management and Medication Current Pain Management: Notes Topical or injectable lidocaine is offered to patient for acute pain when surgical debridement is performed. If needed, Patient is instructed to use over the counter pain medication  for the following 24-48 hours after debridement. Wound care MDs do not prescribed pain medications. Patient has chronic pain or uncontrolled pain. Patient has been instructed to make an appointment with their Primary Care Physician for pain management. Electronic Signature(s) Signed: 02/22/2016 5:05:30 PM By: Montey Hora Entered By: Montey Hora on 02/22/2016 15:47:30 Weatherspoon, Desma Maxim ([id number]) -------------------------------------------------------------------------------- Patient/Caregiver Education Details Patient Name: Gloria Grimes Date of Service: 02/22/2016 3:00 PM Medical Record Patient Account Number: [id number] Number: Treating RN: Montey Hora 06-Aug-1930 (82 y.o. Other Clinician: Date of Birth/Gender: Female) Treating ROBSON, MICHAEL Primary Care Physician: [place] Pink Physician/Extender: G Referring Physician: Payton Doughty in Treatment: 2 Education Assessment Education Provided To: Patient Education Topics Provided Venous: Handouts: Other: come in for rewrap if needed Methods: Explain/Verbal Responses: State content correctly Electronic Signature(s) Signed: 02/22/2016 5:05:30 PM By: Montey Hora Entered By: Montey Hora on 02/22/2016 17:04:43 Gloria Grimes ([id number]) -------------------------------------------------------------------------------- Wound Assessment Details Patient Name: Gloria Grimes Date of Service: 02/22/2016 3:00 PM Medical Record Patient Account Number: [id number] Number: Treating RN: Montey Hora October 29, 1930 (82 y.o. Other Clinician: Date of Birth/Sex: Female) Treating ROBSON, [place] Primary Care Physician: [place] Pink Physician/Extender: G Referring Physician: Payton Doughty in Treatment: 2 Wound Status Wound Number: 1 Primary Trauma, Other Etiology: Wound Location: Right Lower Leg - Anterior Wound Status: Open Wounding Event: Trauma Comorbid Congestive Heart  Failure, Date Acquired: 01/19/2016 History: Hypertension Weeks Of Treatment: 2 Clustered Wound: No Photos Wound Measurements Length: (cm) 0.6 Width: (cm) 0.7 Depth: (cm) 0.3 Area: (cm) 0.33 Volume: (cm) 0.099 % Reduction in Area: 88.9% % Reduction in Volume: 88.9% Epithelialization: Small (1-33%) Wound Description Classification: Partial Thickness Wound Margin: Distinct, outline attached Exudate Amount: Large Exudate Type: Serosanguineous Exudate Color: red, brown Foul Odor After Cleansing: No Wound Bed Granulation Amount: Small (1-33%) Exposed Structure Granulation Quality: Red Fascia Exposed: No Necrotic Amount: Large (67-100%) Fat Layer Exposed: No RINKA, JERSEY ([id number]) Necrotic Quality: Eschar, Adherent Slough Tendon Exposed: No Muscle Exposed: No Joint Exposed: No Bone Exposed: No Limited to Skin Breakdown Periwound Skin Texture Texture Color No Abnormalities Noted: No No Abnormalities Noted: No Localized Edema: Yes Erythema: Yes Erythema Location: Circumferential Moisture No Abnormalities Noted: No Temperature / Pain Maceration: Yes Temperature: No Abnormality Moist: Yes Tenderness on Palpation: Yes Wound Preparation Ulcer Cleansing: Rinsed/Irrigated with Saline Topical Anesthetic Applied: Other: lidocaine 4%, Treatment Notes Wound #1 (Right, Anterior Lower Leg) 1. Cleansed with: Cleanse wound with antibacterial soap and water 2. Anesthetic Topical Lidocaine 4% cream to wound bed prior to debridement 4. Dressing Applied: Prisma Ag 5. Secondary Dressing Applied ABD Pad 7. Secured with Other (specify in notes) Notes kerlix, coban, Electronic Signature(s) Signed: 02/22/2016 5:05:30 PM By: Montey Hora Entered By: Montey Hora on 02/22/2016 16:13:40 Gloria Grimes ([id number]) -------------------------------------------------------------------------------- Vitals Details Patient Name: Gloria Grimes Date of Service: 02/22/2016  3:00 PM Medical Record Patient Account Number: [id number] Number: Treating RN: Montey Hora [birth year] (82 y.o. Other Clinician: Date of  Birth/Sex: Female) Treating ROBSON, [place] Primary Care Physician: [place] Pink Physician/Extender: G Referring Physician: Payton Doughty in Treatment: 2 Vital Signs Time Taken: 15:47 Temperature (F): 98.3 Height (in): 62 Pulse (bpm): 88 Weight (lbs): 156.9 Respiratory Rate (breaths/min): 18 Body Mass Index (BMI): 28.7 Blood Pressure (mmHg): 163/61 Reference Range: 80 - 120 mg / dl Electronic Signature(s) Signed: 02/22/2016 5:05:30 PM By: Montey Hora Entered By: Montey Hora on 02/22/2016 15:48:26

## 2016-02-23 NOTE — Progress Notes (Signed)
HINLEY, Gloria ([id number]) Visit Report for 02/22/2016 Chief Complaint Document Details Patient Name: Gloria Grimes, Gloria Grimes Date of Service: 02/22/2016 3:00 PM Medical Record Patient Account Number: [id number] Number: Treating RN: Montey Hora [birth year] (82 y.o. Other Clinician: Date of Birth/Sex: Female) Treating Aydan Phoenix Primary Care Physician/Extender: Curly Rim Physician: Referring Physician: Payton Doughty in Treatment: 2 Information Obtained from: Patient Chief Complaint 02/02/16; patient is here for review of a traumatic wound on her right lower leg Electronic Signature(s) Signed: 02/23/2016 8:02:21 AM By: Linton Ham MD Entered By: Linton Ham on 02/22/2016 16:38:40 Fetty, Desma Maxim ([id number]) -------------------------------------------------------------------------------- HPI Details Patient Name: Gloria Grimes Date of Service: 02/22/2016 3:00 PM Medical Record Patient Account Number: [id number] Number: Treating RN: Montey Hora 08/23/1930 (82 y.o. 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 She lives at home with her husband who is had recent health problems himself. In any case she was making a bed in her home 2 weeks ago and managed to traumatized her right lower leg on the handle of her reclining chair. Per the patient's description this was a punched-out wound over they made some attempt to suture this together with 7 sutures. She was given a course of oral antibiotics. Her primary physician remove the sutures 7 days later however the wound had already dehisced. Since then she's been using antibiotic  ointment and nonadherent pad and some form of wrap applied at an urgent care. She has no prior wound history. She is receiving to rounds of oral antibiotics finishing today. She is not a diabetic and is a long standing ex-smoker. Her only other primary medical diagnosis is congestive heart failure. The ABI's in this clinic was 0.7 on the right 0.8 on the left 02/09/16; she arrives today with not much change in the wound surface. Still an adherent slough although this is better than last week. Clearly the decision to use Prisma on this was not the right decision, we'll change to Santyl this week. 02/16/16; the wound is smaller still with some depth with adherent slough. Using Santyl 02/22/16; the wound seems to be smaller less adherent slough it. She has been using Santyl for 2 weeks Electronic Signature(s) Signed: 02/23/2016 8:02:21 AM By: Linton Ham MD Entered By: Linton Ham on 02/22/2016 16:46:20 Paterson, Desma Maxim ([id number]) -------------------------------------------------------------------------------- Physical Exam Details Patient Name: Gloria Grimes Date of Service: 02/22/2016 3:00 PM Medical Record Patient Account Number: [id number] Number: Treating RN: Montey Hora 1931/03/16 (82 y.o. Other Clinician: Date of Birth/Sex: Female) Treating Adith Tejada Primary Care Physician/Extender: Curly Rim Physician: Referring Physician: Payton Doughty in Treatment: 2 Constitutional Sitting or standing Blood Pressure is within target range for patient.. Pulse regular and within target range for patient.Marland Kitchen Respirations regular, non-labored and within target range.. Temperature is normal and within the target range for the patient.. Patient's appearance is neat and clean. Appears in no acute distress. Well nourished and well developed.. Eyes Conjunctivae clear. No discharge.Marland Kitchen Respiratory Respiratory effort is easy and symmetric bilaterally. Rate is normal  at rest and on room air.. Cardiovascular Pedal pulses palpable and strong bilaterally.. Lymphatic Nonpalpable in the popliteal or inguinal area. Psychiatric No evidence of depression, anxiety, or agitation. Calm, cooperative, and communicative. Appropriate interactions and affect.. Notes Wound  exam; wound measures smaller. There is less adherent slough. Still a small amount of depth of this. No debridement required Electronic Signature(s) Signed: 02/23/2016 8:02:21 AM By: Linton Ham MD Entered By: Linton Ham on 02/22/2016 16:47:53 Mcaleer, Desma Maxim ([id number]) -------------------------------------------------------------------------------- Physician Orders Details Patient Name: Gloria Grimes Date of Service: 02/22/2016 3:00 PM Medical Record Patient Account Number: [id number] Number: Treating RN: Montey Hora 1930-12-09 (82 y.o. Other Clinician: Date of Birth/Sex: Female) Treating Latanja Lehenbauer Primary Care Physician/Extender: Curly Rim Physician: Referring Physician: Payton Doughty in Treatment: 2 Verbal / Phone Orders: Yes Clinician: Montey Hora Read Back and Verified: Yes Diagnosis Coding Wound Cleansing Wound #1 Right,Anterior Lower Leg o Cleanse wound with mild soap and water o May shower with protection. o No tub bath. Anesthetic Wound #1 Right,Anterior Lower Leg o Topical Lidocaine 4% cream applied to wound bed prior to debridement Skin Barriers/Peri-Wound Care Wound #1 Right,Anterior Lower Leg o Barrier cream o Moisturizing lotion o Other: - TCA Primary Wound Dressing Wound #1 Right,Anterior Lower Leg o Prisma Ag Secondary Dressing Wound #1 Right,Anterior Lower Leg o ABD pad o Dry Gauze - charcoal Dressing Change Frequency Wound #1 Right,Anterior Lower Leg o Change dressing every week Follow-up Appointments Wound #1 Right,Anterior Lower Leg o Return Appointment in 1 week. Gloria, Grimes ([id number]) Edema Control Wound #1 Right,Anterior Lower Leg o Elevate legs to the level of the heart and pump ankles as often as possible o Other: - Kerlix and Coban unna to anchor Additional Orders / Instructions Wound #1 Right,Anterior Lower Leg o Increase protein intake. Electronic Signature(s) Signed: 02/22/2016 5:05:30 PM By: Montey Hora Signed: 02/23/2016 8:02:21 AM By: Linton Ham MD Entered By: Montey Hora on 02/22/2016 16:18:15 Gloria Grimes ([id number]) -------------------------------------------------------------------------------- Problem List Details Patient Name: Gloria Grimes Date of Service: 02/22/2016 3:00 PM Medical Record Patient Account Number: [id number] Number: Treating RN: Montey Hora [birth year] (82 y.o. Other Clinician: Date of Birth/Sex: Female) Treating Blake Vetrano Primary Care Physician/Extender: Curly Rim Physician: Referring Physician: Payton Doughty in Treatment: 2 Active Problems ICD-10 Encounter Code Description Active Date Diagnosis S81.811S Laceration without foreign body, right lower leg, sequela 02/02/2016 Yes I87.301 Chronic venous hypertension (idiopathic) without 0000000 Yes complications of right lower extremity Inactive Problems Resolved Problems Electronic Signature(s) Signed: 02/23/2016 8:02:21 AM By: Linton Ham MD Entered By: Linton Ham on 02/22/2016 16:38:27 Gloria Grimes ([id number]) -------------------------------------------------------------------------------- Progress Note Details Patient Name: Gloria Grimes Date of Service: 02/22/2016 3:00 PM Medical Record Patient Account Number: [id number] Number: Treating RN: Montey Hora [birth year] (82 y.o. Other Clinician: Date of Birth/Sex: Female) Treating Emilly Lavey Primary Care Physician/Extender: Curly Rim Physician: Referring Physician: Payton Doughty in Treatment:  2 Subjective Chief Complaint Information obtained from Patient 02/02/16; patient is here for review of a traumatic wound on her right lower leg History of Present Illness (HPI) 02/02/16; very pleasant 82 year old woman initially from 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 She lives at home with her husband who is had recent health problems himself. In any case she was making a bed in her home 2 weeks ago and managed to traumatized her right lower leg on the handle of her reclining chair. Per the patient's description this was a punched-out wound over they made some attempt to suture this together with 7 sutures. She was given a course of oral antibiotics. Her primary physician remove the sutures 7 days later however the wound had already dehisced. Since  then she's been using antibiotic ointment and nonadherent pad and some form of wrap applied at an urgent care. She has no prior wound history. She is receiving to rounds of oral antibiotics finishing today. She is not a diabetic and is a long standing ex-smoker. Her only other primary medical diagnosis is congestive heart failure. The ABI's in this clinic was 0.7 on the right 0.8 on the left 02/09/16; she arrives today with not much change in the wound surface. Still an adherent slough although this is better than last week. Clearly the decision to use Prisma on this was not the right decision, we'll change to Santyl this week. 02/16/16; the wound is smaller still with some depth with adherent slough. Using Santyl 02/22/16; the wound seems to be smaller less adherent slough it. She has been using Santyl for 2 weeks Objective Constitutional Sitting or standing Blood Pressure is within target range for patient.. Pulse regular and within target range for patient.Marland Kitchen Respirations regular, non-labored and within target range.. Temperature is normal and within the target range for the patient.. Patient's appearance is neat  and clean. Appears in no acute distress. Well nourished and well developed.. Vitals Time Taken: 3:47 PM, Height: 62 in, Weight: 156.9 lbs, BMI: 28.7, Temperature: 98.3 F, Pulse: 88 Weilbacher, Alyannah W. ([id number]) bpm, Respiratory Rate: 18 breaths/min, Blood Pressure: 163/61 mmHg. Eyes Conjunctivae clear. No discharge.Marland Kitchen Respiratory Respiratory effort is easy and symmetric bilaterally. Rate is normal at rest and on room air.. Cardiovascular Pedal pulses palpable and strong bilaterally.. Lymphatic Nonpalpable in the popliteal or inguinal area. Psychiatric No evidence of depression, anxiety, or agitation. Calm, cooperative, and communicative. Appropriate interactions and affect.. General Notes: Wound exam; wound measures smaller. There is less adherent slough. Still a small amount of depth of this. No debridement required Integumentary (Hair, Skin) Wound #1 status is Open. Original cause of wound was Trauma. The wound is located on the Right,Anterior Lower Leg. The wound measures 0.6cm length x 0.7cm width x 0.3cm depth; 0.33cm^2 area and 0.099cm^3 volume. The wound is limited to skin breakdown. There is a large amount of serosanguineous drainage noted. The wound margin is distinct with the outline attached to the wound base. There is small (1-33%) red granulation within the wound bed. There is a large (67-100%) amount of necrotic tissue within the wound bed including Eschar and Adherent Slough. The periwound skin appearance exhibited: Localized Edema, Maceration, Moist, Erythema. The surrounding wound skin color is noted with erythema which is circumferential. Periwound temperature was noted as No Abnormality. The periwound has tenderness on palpation. Assessment Active Problems ICD-10 S81.811S - Laceration without foreign body, right lower leg, sequela I87.301 - Chronic venous hypertension (idiopathic) without complications of right lower extremity VIVION, HAUGH.  ([id number]) Plan Wound Cleansing: Wound #1 Right,Anterior Lower Leg: Cleanse wound with mild soap and water May shower with protection. No tub bath. Anesthetic: Wound #1 Right,Anterior Lower Leg: Topical Lidocaine 4% cream applied to wound bed prior to debridement Skin Barriers/Peri-Wound Care: Wound #1 Right,Anterior Lower Leg: Barrier cream Moisturizing lotion Other: - TCA Primary Wound Dressing: Wound #1 Right,Anterior Lower Leg: Prisma Ag Secondary Dressing: Wound #1 Right,Anterior Lower Leg: ABD pad Dry Gauze - charcoal Dressing Change Frequency: Wound #1 Right,Anterior Lower Leg: Change dressing every week Follow-up Appointments: Wound #1 Right,Anterior Lower Leg: Return Appointment in 1 week. Edema Control: Wound #1 Right,Anterior Lower Leg: Elevate legs to the level of the heart and pump ankles as often as possible Other: - Kerlix and Coban unna to  anchor Additional Orders / Instructions: Wound #1 Right,Anterior Lower Leg: Increase protein intake. changed the dressing back to prisma. not enough slough to justify continuing santyl Electronic Signature(s) Signed: 02/23/2016 8:02:21 AM By: Linton Ham MD Entered By: Linton Ham on 02/22/2016 16:48:52 Steck, Desma Maxim ([id number]) JENY, KINN ([id number]) -------------------------------------------------------------------------------- SuperBill Details Patient Name: Gloria Grimes Date of Service: 02/22/2016 Medical Record Patient Account Number: [id number] Number: Treating RN: Montey Hora May 21, 1931 (82 y.o. Other Clinician: Date of Birth/Sex: Female) Treating Gwenevere Goga Primary Care Physician/Extender: Curly Rim Physician: Suella Grove in Treatment: 2 Referring Physician: [place] Pink Diagnosis Coding ICD-10 Codes Code Description [phone] Laceration without foreign body, right lower leg, sequela I87.301 Chronic venous hypertension (idiopathic) without complications of  right lower extremity Facility Procedures CPT4 Code: [id number] Description: 99213 - WOUND CARE VISIT-LEV 3 EST PT Modifier: Quantity: 1 Physician Procedures CPT4 Code: [id number] Description: [id number] - WC PHYS LEVEL 3 - EST PT ICD-10 Description Diagnosis [id number] Laceration without foreign body, right lower leg Modifier: , sequela Quantity: 1 Electronic Signature(s) Signed: 02/22/2016 5:03:21 PM By: Montey Hora Signed: 02/23/2016 8:02:21 AM By: Linton Ham MD Entered By: Montey Hora on 02/22/2016 17:03:21

## 2016-02-29 ENCOUNTER — Encounter: Payer: Medicare Other | Attending: Physician Assistant | Admitting: Physician Assistant

## 2016-02-29 DIAGNOSIS — I110 Hypertensive heart disease with heart failure: Secondary | ICD-10-CM | POA: Insufficient documentation

## 2016-02-29 DIAGNOSIS — S81811S Laceration without foreign body, right lower leg, sequela: Secondary | ICD-10-CM | POA: Diagnosis not present

## 2016-02-29 DIAGNOSIS — X58XXXS Exposure to other specified factors, sequela: Secondary | ICD-10-CM | POA: Insufficient documentation

## 2016-02-29 DIAGNOSIS — I509 Heart failure, unspecified: Secondary | ICD-10-CM | POA: Diagnosis not present

## 2016-02-29 DIAGNOSIS — I429 Cardiomyopathy, unspecified: Secondary | ICD-10-CM | POA: Insufficient documentation

## 2016-02-29 DIAGNOSIS — Z880 Allergy status to penicillin: Secondary | ICD-10-CM | POA: Diagnosis not present

## 2016-02-29 DIAGNOSIS — Z87891 Personal history of nicotine dependence: Secondary | ICD-10-CM | POA: Insufficient documentation

## 2016-02-29 DIAGNOSIS — I87301 Chronic venous hypertension (idiopathic) without complications of right lower extremity: Secondary | ICD-10-CM | POA: Insufficient documentation

## 2016-03-02 NOTE — Progress Notes (Addendum)
Gloria Grimes, Gloria Grimes ([id number]) Visit Report for 02/29/2016 Arrival Information Details Patient Name: Gloria Grimes, Gloria Grimes Date of Service: 02/29/2016 9:15 AM Medical Record Number: [id number] Patient Account Number: [id number] Date of Birth/Sex: [birth year] (82 y.o. Female) Treating RN: Montey Hora Primary Care Physician: Maryland Pink Other Clinician: Referring Physician: [place] Pink Treating Physician/Extender: Melburn Hake, HOYT Weeks in Treatment: 3 Visit Information History Since Last Visit Added or deleted any medications: No Patient Arrived: Ambulatory Any new allergies or adverse reactions: No Arrival Time: 09:18 Had a fall or experienced change in No Accompanied By: self activities of daily living that may affect Transfer Assistance: None risk of falls: Patient Identification Verified: Yes Signs or symptoms of abuse/neglect since last No Secondary Verification Process Yes visito Completed: Hospitalized since last visit: No Patient Requires Transmission-Based No Pain Present Now: No Precautions: Patient Has Alerts: No Electronic Signature(s) Signed: 03/07/2016 1:46:02 PM By: Gretta Cool, RN, BSN, Kim RN, BSN Previous Signature: 02/29/2016 5:47:08 PM Version By: Montey Hora Entered By: Gretta Cool RN, BSN, Kim on 03/07/2016 13:23:25 Gloria Grimes ([id number]) -------------------------------------------------------------------------------- Encounter Discharge Information Details Patient Name: Gloria Grimes Date of Service: 02/29/2016 9:15 AM Medical Record Number: [id number] Patient Account Number: [id number] Date of Birth/Sex: March 30, 1931 (82 y.o. Female) Treating RN: Montey Hora Primary Care Physician: Maryland Pink Other Clinician: Referring Physician: [place] Pink Treating Physician/Extender: Melburn Hake, HOYT Weeks in Treatment: 3 Encounter Discharge Information Items Discharge Pain Level: 0 Discharge Condition: Stable Ambulatory Status: Ambulatory Discharge  Destination: Home Transportation: Private Auto Accompanied By: self Schedule Follow-up Appointment: Yes Medication Reconciliation completed and provided to Patient/Care No Gloria Grimes: Provided on Clinical Summary of Care: 02/29/2016 Form Type Recipient Paper Patient JF Electronic Signature(s) Signed: 02/29/2016 5:47:08 PM By: Montey Hora Previous Signature: 02/29/2016 10:01:01 AM Version By: Ruthine Dose Entered By: Montey Hora on 02/29/2016 10:43:32 Gloria Grimes ([id number]) -------------------------------------------------------------------------------- Lower Extremity Assessment Details Patient Name: Gloria Grimes Date of Service: 02/29/2016 9:15 AM Medical Record Number: [id number] Patient Account Number: [id number] Date of Birth/Sex: [birth year] (82 y.o. Female) Treating RN: Montey Hora Primary Care Physician: Maryland Pink Other Clinician: Referring Physician: [place] Pink Treating Physician/Extender: Melburn Hake, HOYT Weeks in Treatment: 3 Edema Assessment Assessed: [Left: No] [Right: No] Edema: [Left: Ye] [Right: s] Calf Left: Right: Point of Measurement: 28 cm From Medial Instep cm 34.8 cm Ankle Left: Right: Point of Measurement: 10 cm From Medial Instep cm 23 cm Vascular Assessment Pulses: Posterior Tibial Dorsalis Pedis Palpable: [Right:Yes] Extremity colors, hair growth, and conditions: Extremity Color: [Right:Hyperpigmented] Hair Growth on Extremity: [Right:No] Temperature of Extremity: [Right:Warm] Capillary Refill: [Right:< 3 seconds] Electronic Signature(s) Signed: 03/07/2016 1:46:02 PM By: Gretta Cool RN, BSN, Kim RN, BSN Signed: 03/08/2016 4:34:31 PM By: Montey Hora Previous Signature: 02/29/2016 5:47:08 PM Version By: Montey Hora Entered By: Gretta Cool RN, BSN, Kim on 03/07/2016 13:23:47 Gloria Grimes ([id number]) -------------------------------------------------------------------------------- Multi Wound Chart Details Patient Name:  Gloria Grimes Date of Service: 02/29/2016 9:15 AM Medical Record Number: [id number] Patient Account Number: [id number] Date of Birth/Sex: 01-05-1931 (82 y.o. Female) Treating RN: Montey Hora Primary Care Physician: Maryland Pink Other Clinician: Referring Physician: Maryland Pink Treating Physician/Extender: Melburn Hake, HOYT Weeks in Treatment: 3 Vital Signs Height(in): 62 Pulse(bpm): 86 Weight(lbs): 156.9 Blood Pressure 156/68 (mmHg): Body Mass Index(BMI): 29 Temperature(F): 98.1 Respiratory Rate 18 (breaths/min): Photos: [N/A:N/A] Wound Location: Right Lower Leg - Anterior N/A N/A Wounding Event: Trauma N/A N/A Primary Etiology: Trauma, Other N/A N/A Comorbid History: Congestive Heart Failure, N/A N/A Hypertension Date Acquired: 01/19/2016 N/A N/A Weeks of Treatment:  3 N/A N/A Wound Status: Open N/A N/A Measurements L x W x D 0.5x0.6x0.2 N/A N/A (cm) Area (cm) : 0.236 N/A N/A Volume (cm) : 0.047 N/A N/A % Reduction in Area: 92.10% N/A N/A % Reduction in Volume: 94.70% N/A N/A Classification: Full Thickness Without N/A N/A Exposed Support Structures Exudate Amount: Large N/A N/A Exudate Type: Serosanguineous N/A N/A Exudate Color: red, brown N/A N/A Wound Margin: Distinct, outline attached N/A N/A Granulation Amount: Large (67-100%) N/A N/A Granulation Quality: Red N/A N/A Gloria Grimes, FRAIM. ([id number]) Necrotic Amount: Small (1-33%) N/A N/A Necrotic Tissue: Eschar, Adherent Slough N/A N/A Exposed Structures: Fascia: No N/A N/A Fat: No Tendon: No Muscle: No Joint: No Bone: No Limited to Skin Breakdown Epithelialization: Small (1-33%) N/A N/A Debridement: Debridement [id number] N/A N/A [id number]) Pre-procedure 09:48 N/A N/A Verification/Time Out Taken: Pain Control: Lidocaine 4% Topical N/A N/A Solution Tissue Debrided: Fibrin/Slough, Exudates, N/A N/A Subcutaneous Level: Skin/Subcutaneous N/A N/A Tissue Debridement Area (sq 0.3 N/A  N/A cm): Instrument: Curette N/A N/A Bleeding: Minimum N/A N/A Hemostasis Achieved: Pressure N/A N/A Procedural Pain: 0 N/A N/A Post Procedural Pain: 0 N/A N/A Debridement Treatment Procedure was tolerated N/A N/A Response: well Post Debridement 0.5x0.6x0.25 N/A N/A Measurements L x W x D (cm) Post Debridement 0.059 N/A N/A Volume: (cm) Periwound Skin Texture: Edema: Yes N/A N/A Periwound Skin Maceration: Yes N/A N/A Moisture: Moist: Yes Periwound Skin Color: Erythema: Yes N/A N/A Erythema Location: Circumferential N/A N/A Temperature: No Abnormality N/A N/A Tenderness on Yes N/A N/A Palpation: Wound Preparation: Ulcer Cleansing: Other: N/A N/A soap and water Topical Anesthetic Applied: Other: lidocaine 4% Gloria Grimes, Gloria Grimes ([id number]) Procedures Performed: Debridement N/A N/A Treatment Notes Wound #1 (Right, Anterior Lower Leg) 1. Cleansed with: Cleanse wound with antibacterial soap and water 2. Anesthetic Topical Lidocaine 4% cream to wound bed prior to debridement 4. Dressing Applied: Prisma Ag 5. Secondary Dressing Applied Dry Gauze 7. Secured with Other (specify in notes) Notes kerlix, coban wrap Electronic Signature(s) Signed: 03/07/2016 1:46:02 PM By: Gretta Cool, RN, BSN, Kim RN, BSN Previous Signature: 02/29/2016 5:47:08 PM Version By: Montey Hora Entered By: Gretta Cool RN, BSN, Kim on 03/07/2016 13:24:15 Gloria Grimes, Gloria Grimes ([id number]) -------------------------------------------------------------------------------- [place] Details Patient Name: Gloria Grimes Date of Service: 02/29/2016 9:15 AM Medical Record Number: [id number] Patient Account Number: [id number] Date of Birth/Sex: [birth year] (82 y.o. Female) Treating RN: Montey Hora Primary Care Physician: Maryland Pink Other Clinician: Referring Physician: Maryland Pink Treating Physician/Extender: Melburn Hake, HOYT Weeks in Treatment: 3 Active Inactive Nutrition Nursing  Diagnoses: Imbalanced nutrition Goals: Patient/caregiver agrees to and verbalizes understanding of need to use nutritional supplements and/or vitamins as prescribed Date Initiated: 02/02/2016 Goal Status: Active Interventions: Assess patient nutrition upon admission and as needed per policy Notes: Orientation to the Wound Care Program Nursing Diagnoses: Knowledge deficit related to the wound healing center program Goals: Patient/caregiver will verbalize understanding of the [facility] Date Initiated: 02/02/2016 Goal Status: Active Interventions: Provide education on orientation to the wound center Notes: Pain, Acute or Chronic Nursing Diagnoses: Pain, acute or chronic: actual or potential Potential alteration in comfort, pain Goals: Gloria Grimes, Gloria Grimes ([id number]) Patient will verbalize adequate pain control and receive pain control interventions during procedures as needed Date Initiated: 02/02/2016 Goal Status: Active Interventions: Assess comfort goal upon admission Complete pain assessment as per visit requirements Notes: Soft Tissue Infection Nursing Diagnoses: Impaired tissue integrity Goals: Patient/caregiver will verbalize understanding of or measures to prevent infection and contamination in the home setting Date Initiated:  02/02/2016 Goal Status: Active Patient's soft tissue infection will resolve Date Initiated: 02/02/2016 Goal Status: Active Interventions: Assess signs and symptoms of infection every visit Provide education on infection Treatment Activities: Education provided on Infection : 02/16/2016 Notes: Wound/Skin Impairment Nursing Diagnoses: Impaired tissue integrity Goals: Ulcer/skin breakdown will have a volume reduction of 30% by week 4 Date Initiated: 02/02/2016 Goal Status: Active Ulcer/skin breakdown will have a volume reduction of 50% by week 8 Date Initiated: 02/02/2016 Goal Status: Active Ulcer/skin breakdown will have a volume  reduction of 80% by week 12 Gloria Grimes, Gloria Grimes ([id number]) Date Initiated: 02/02/2016 Goal Status: Active Interventions: Assess patient/caregiver ability to obtain necessary supplies Assess ulceration(s) every visit Notes: Electronic Signature(s) Signed: 03/07/2016 1:46:02 PM By: Gretta Cool, RN, BSN, Kim RN, BSN Signed: 03/08/2016 4:34:31 PM By: Montey Hora Previous Signature: 02/29/2016 5:47:08 PM Version By: Montey Hora Entered By: Gretta Cool RN, BSN, Kim on 03/07/2016 13:24:00 Gloria Grimes ([id number]) -------------------------------------------------------------------------------- Pain Assessment Details Patient Name: Gloria Grimes Date of Service: 02/29/2016 9:15 AM Medical Record Number: [id number] Patient Account Number: [id number] Date of Birth/Sex: 03/19/1931 (82 y.o. Female) Treating RN: Montey Hora Primary Care Physician: Maryland Pink Other Clinician: Referring Physician: [place] Pink Treating Physician/Extender: Melburn Hake, HOYT Weeks in Treatment: 3 Active Problems Location of Pain Severity and Description of Pain Patient Has Paino No Site Locations Pain Management and Medication Current Pain Management: Notes Topical or injectable lidocaine is offered to patient for acute pain when surgical debridement is performed. If needed, Patient is instructed to use over the counter pain medication for the following 24-48 hours after debridement. Wound care MDs do not prescribed pain medications. Patient has chronic pain or uncontrolled pain. Patient has been instructed to make an appointment with their Primary Care Physician for pain management. Electronic Signature(s) Signed: 03/07/2016 1:46:02 PM By: Gretta Cool RN, BSN, Kim RN, BSN Signed: 03/08/2016 4:34:31 PM By: Montey Hora Previous Signature: 02/29/2016 5:47:08 PM Version By: Montey Hora Entered By: Gretta Cool RN, BSN, Kim on 03/07/2016 13:23:33 Gloria Grimes, Gloria Grimes  ([id number]) -------------------------------------------------------------------------------- Patient/Caregiver Education Details Patient Name: Gloria Grimes Date of Service: 02/29/2016 9:15 AM Medical Record Number: [id number] Patient Account Number: [id number] Date of Birth/Gender: November 19, 1930 (82 y.o. Female) Treating RN: Montey Hora Primary Care Physician: Maryland Pink Other Clinician: Referring Physician: [place] Pink Treating Physician/Extender: Melburn Hake, HOYT Weeks in Treatment: 3 Education Assessment Education Provided To: Patient Education Topics Provided Wound/Skin Impairment: Handouts: Other: come in for rewrap if needed Methods: Explain/Verbal Responses: State content correctly Electronic Signature(s) Signed: 02/29/2016 5:47:08 PM By: Montey Hora Entered By: Montey Hora on 02/29/2016 10:43:49 Gloria Grimes ([id number]) -------------------------------------------------------------------------------- Wound Assessment Details Patient Name: Gloria Grimes Date of Service: 02/29/2016 9:15 AM Medical Record Patient Account Number: [id number] Number: Treating RN: Montey Hora 06-Mar-1931 (82 y.o. Other Clinician: Date of Birth/Sex: Female) Treating [place], [place] Primary Care Physician: Maryland Pink Physician/Extender: G Referring Physician: Payton Doughty in Treatment: 3 Wound Status Wound Number: 1 Primary Trauma, Other Etiology: Wound Location: Right Lower Leg - Anterior Wound Status: Open Wounding Event: Trauma Comorbid Congestive Heart Failure, Date Acquired: 01/19/2016 History: Hypertension Weeks Of Treatment: 3 Clustered Wound: No Photos Wound Measurements Length: (cm) 0.5 Width: (cm) 0.6 Depth: (cm) 0.2 Area: (cm) 0.236 Volume: (cm) 0.047 % Reduction in Area: 92.1% % Reduction in Volume: 94.7% Epithelialization: Small (1-33%) Tunneling: No Undermining: No Wound Description Full Thickness Without Exposed  Foul Odor After Classification: Support Structures Wound Margin: Distinct, outline attached Exudate Large Amount: Exudate Type: Serosanguineous Exudate Color: red,  brown Cleansing: No Wound Bed Granulation Amount: Large (67-100%) Exposed Structure Granulation Quality: Red Fascia Exposed: No RAEGEN, TON. ([id number]) Necrotic Amount: Small (1-33%) Fat Layer Exposed: No Necrotic Quality: Eschar, Adherent Slough Tendon Exposed: No Muscle Exposed: No Joint Exposed: No Bone Exposed: No Limited to Skin Breakdown Periwound Skin Texture Texture Color No Abnormalities Noted: No No Abnormalities Noted: No Localized Edema: Yes Erythema: Yes Erythema Location: Circumferential Moisture No Abnormalities Noted: No Temperature / Pain Maceration: Yes Temperature: No Abnormality Moist: Yes Tenderness on Palpation: Yes Wound Preparation Ulcer Cleansing: Other: soap and water, Topical Anesthetic Applied: Other: lidocaine 4%, Electronic Signature(s) Signed: 02/29/2016 5:47:08 PM By: Montey Hora Entered By: Montey Hora on 02/29/2016 09:30:30 Gloria Grimes ([id number]) -------------------------------------------------------------------------------- Vitals Details Patient Name: Gloria Grimes Date of Service: 02/29/2016 9:15 AM Medical Record Number: [id number] Patient Account Number: [id number] Date of Birth/Sex: Jun 01, 1930 (82 y.o. Female) Treating RN: Montey Hora Primary Care Physician: Maryland Pink Other Clinician: Referring Physician: [place] Pink Treating Physician/Extender: Melburn Hake, HOYT Weeks in Treatment: 3 Vital Signs Time Taken: 09:21 Temperature (F): 98.1 Height (in): 62 Pulse (bpm): 86 Weight (lbs): 156.9 Respiratory Rate (breaths/min): 18 Body Mass Index (BMI): 28.7 Blood Pressure (mmHg): 156/68 Reference Range: 80 - 120 mg / dl Electronic Signature(s) Signed: 03/07/2016 1:46:02 PM By: Gretta Cool, RN, BSN, Kim RN, BSN Previous Signature:  02/29/2016 5:47:08 PM Version By: Montey Hora Entered By: Gretta Cool RN, BSN, Kim on 03/07/2016 13:23:40

## 2016-03-07 ENCOUNTER — Encounter: Payer: Medicare Other | Admitting: Physician Assistant

## 2016-03-07 DIAGNOSIS — I87301 Chronic venous hypertension (idiopathic) without complications of right lower extremity: Secondary | ICD-10-CM | POA: Diagnosis not present

## 2016-03-07 NOTE — Progress Notes (Addendum)
DEDRE, SHORTS ([id number]) Visit Report for 03/07/2016 Arrival Information Details Patient Name: Gloria Grimes, Gloria Grimes Date of Service: 03/07/2016 12:45 PM Medical Record Number: [id number] Patient Account Number: [id number] Date of Birth/Sex: 1931-03-21 (82 y.o. Female) Treating RN: Montey Hora Primary Care Physician: Maryland Pink Other Clinician: Referring Physician: [place] Pink Treating Physician/Extender: Melburn Hake, HOYT Weeks in Treatment: 4 Visit Information History Since Last Visit Added or deleted any medications: No Patient Arrived: Ambulatory Any new allergies or adverse reactions: No Arrival Time: 12:48 Had a fall or experienced change in No Accompanied By: self activities of daily living that may affect Transfer Assistance: None risk of falls: Patient Identification Verified: Yes Signs or symptoms of abuse/neglect since last No Secondary Verification Process Yes visito Completed: Hospitalized since last visit: No Patient Requires Transmission-Based No Pain Present Now: No Precautions: Patient Has Alerts: No Electronic Signature(s) Signed: 03/07/2016 4:06:00 PM By: Montey Hora Entered By: Montey Hora on 03/07/2016 12:51:16 Gloria Grimes ([id number]) -------------------------------------------------------------------------------- Encounter Discharge Information Details Patient Name: Gloria Grimes Date of Service: 03/07/2016 12:45 PM Medical Record Number: [id number] Patient Account Number: [id number] Date of Birth/Sex: 04/01/1931 (82 y.o. Female) Treating RN: Montey Hora Primary Care Physician: Maryland Pink Other Clinician: Referring Physician: [place] Pink Treating Physician/Extender: Melburn Hake, HOYT Weeks in Treatment: 4 Encounter Discharge Information Items Discharge Pain Level: 0 Discharge Condition: Stable Ambulatory Status: Ambulatory Discharge Destination: Home Transportation: Private Auto Accompanied By: self Schedule  Follow-up Appointment: Yes Medication Reconciliation completed and provided to Patient/Care No Hareem Surowiec: Provided on Clinical Summary of Care: 03/07/2016 Form Type Recipient Paper Patient JF Electronic Signature(s) Signed: 03/07/2016 1:27:45 PM By: Ruthine Dose Entered By: Ruthine Dose on 03/07/2016 13:27:45 Gloria Grimes ([id number]) -------------------------------------------------------------------------------- Lower Extremity Assessment Details Patient Name: Gloria Grimes Date of Service: 03/07/2016 12:45 PM Medical Record Number: [id number] Patient Account Number: [id number] Date of Birth/Sex: 18-Jul-1930 (82 y.o. Female) Treating RN: Montey Hora Primary Care Physician: Maryland Pink Other Clinician: Referring Physician: Maryland Pink Treating Physician/Extender: Melburn Hake, HOYT Weeks in Treatment: 4 Edema Assessment Assessed: [Left: No] [Right: No] Edema: [Left: N] [Right: o] Calf Left: Right: Point of Measurement: 28 cm From Medial Instep cm 34.6 cm Ankle Left: Right: Point of Measurement: 10 cm From Medial Instep cm 22.8 cm Vascular Assessment Pulses: Posterior Tibial Dorsalis Pedis Palpable: [Right:Yes] Extremity colors, hair growth, and conditions: Extremity Color: [Right:Mottled] Hair Growth on Extremity: [Right:No] Temperature of Extremity: [Right:Warm] Capillary Refill: [Right:< 3 seconds] Electronic Signature(s) Signed: 03/07/2016 4:06:00 PM By: Montey Hora Entered By: Montey Hora on 03/07/2016 13:07:44 Gloria Grimes ([id number]) -------------------------------------------------------------------------------- Multi Wound Chart Details Patient Name: Gloria Grimes Date of Service: 03/07/2016 12:45 PM Medical Record Number: [id number] Patient Account Number: [id number] Date of Birth/Sex: 1931/03/27 (82 y.o. Female) Treating RN: Montey Hora Primary Care Physician: Maryland Pink Other Clinician: Referring Physician: [place] Pink Treating Physician/Extender: Melburn Hake, HOYT Weeks in Treatment: 4 Vital Signs Height(in): 62 Pulse(bpm): 103 Weight(lbs): 156.9 Blood Pressure 145/61 (mmHg): Body Mass Index(BMI): 29 Temperature(F): 98.2 Respiratory Rate 18 (breaths/min): Photos: [N/A:N/A] Wound Location: Right Lower Leg - Anterior N/A N/A Wounding Event: Trauma N/A N/A Primary Etiology: Trauma, Other N/A N/A Comorbid History: Congestive Heart Failure, N/A N/A Hypertension Date Acquired: 01/19/2016 N/A N/A Weeks of Treatment: 4 N/A N/A Wound Status: Open N/A N/A Measurements L x W x D 0.5x0.6x0.2 N/A N/A (cm) Area (cm) : 0.236 N/A N/A Volume (cm) : 0.047 N/A N/A % Reduction in Area: 92.10% N/A N/A % Reduction in Volume: 94.70% N/A N/A Classification: Full  Thickness Without N/A N/A Exposed Support Structures Exudate Amount: Large N/A N/A Exudate Type: Serosanguineous N/A N/A Exudate Color: red, brown N/A N/A Wound Margin: Distinct, outline attached N/A N/A Granulation Amount: Large (67-100%) N/A N/A Granulation Quality: Red N/A N/A Gloria Grimes, Gloria Grimes. ([id number]) Necrotic Amount: Small (1-33%) N/A N/A Necrotic Tissue: Eschar, Adherent Slough N/A N/A Exposed Structures: Fascia: No N/A N/A Fat: No Tendon: No Muscle: No Joint: No Bone: No Limited to Skin Breakdown Epithelialization: Small (1-33%) N/A N/A Periwound Skin Texture: Edema: Yes N/A N/A Periwound Skin Maceration: Yes N/A N/A Moisture: Moist: Yes Periwound Skin Color: Erythema: Yes N/A N/A Erythema Location: Circumferential N/A N/A Temperature: No Abnormality N/A N/A Tenderness on Yes N/A N/A Palpation: Wound Preparation: Ulcer Cleansing: Other: N/A N/A soap and water Topical Anesthetic Applied: Other: lidocaine 4% Treatment Notes Electronic Signature(s) Signed: 03/07/2016 4:06:00 PM By: Montey Hora Entered By: Montey Hora on 03/07/2016 13:08:01 Gloria Grimes, Gloria Grimes  ([id number]) -------------------------------------------------------------------------------- [place] Details Patient Name: Gloria Grimes Date of Service: 03/07/2016 12:45 PM Medical Record Number: [id number] Patient Account Number: [id number] Date of Birth/Sex: 10-21-1930 (82 y.o. Female) Treating RN: Montey Hora Primary Care Physician: Maryland Pink Other Clinician: Referring Physician: Maryland Pink Treating Physician/Extender: Melburn Hake, HOYT Weeks in Treatment: 4 Active Inactive Nutrition Nursing Diagnoses: Imbalanced nutrition Goals: Patient/caregiver agrees to and verbalizes understanding of need to use nutritional supplements and/or vitamins as prescribed Date Initiated: 02/02/2016 Goal Status: Active Interventions: Assess patient nutrition upon admission and as needed per policy Notes: Orientation to the Wound Care Program Nursing Diagnoses: Knowledge deficit related to the wound healing center program Goals: Patient/caregiver will verbalize understanding of the [facility] Date Initiated: 02/02/2016 Goal Status: Active Interventions: Provide education on orientation to the wound center Notes: Pain, Acute or Chronic Nursing Diagnoses: Pain, acute or chronic: actual or potential Potential alteration in comfort, pain Goals: Gloria Grimes, Gloria Grimes ([id number]) Patient will verbalize adequate pain control and receive pain control interventions during procedures as needed Date Initiated: 02/02/2016 Goal Status: Active Interventions: Assess comfort goal upon admission Complete pain assessment as per visit requirements Notes: Soft Tissue Infection Nursing Diagnoses: Impaired tissue integrity Goals: Patient/caregiver will verbalize understanding of or measures to prevent infection and contamination in the home setting Date Initiated: 02/02/2016 Goal Status: Active Patient's soft tissue infection will resolve Date Initiated:  02/02/2016 Goal Status: Active Interventions: Assess signs and symptoms of infection every visit Provide education on infection Treatment Activities: Education provided on Infection : 02/16/2016 Notes: Wound/Skin Impairment Nursing Diagnoses: Impaired tissue integrity Goals: Ulcer/skin breakdown will have a volume reduction of 30% by week 4 Date Initiated: 02/02/2016 Goal Status: Active Ulcer/skin breakdown will have a volume reduction of 50% by week 8 Date Initiated: 02/02/2016 Goal Status: Active Ulcer/skin breakdown will have a volume reduction of 80% by week 12 Gloria Grimes, Gloria Grimes ([id number]) Date Initiated: 02/02/2016 Goal Status: Active Interventions: Assess patient/caregiver ability to obtain necessary supplies Assess ulceration(s) every visit Notes: Electronic Signature(s) Signed: 03/07/2016 4:06:00 PM By: Montey Hora Entered By: Montey Hora on 03/07/2016 13:07:52 Gloria Grimes ([id number]) -------------------------------------------------------------------------------- Pain Assessment Details Patient Name: Gloria Grimes Date of Service: 03/07/2016 12:45 PM Medical Record Number: [id number] Patient Account Number: [id number] Date of Birth/Sex: [birth year] (82 y.o. Female) Treating RN: Montey Hora Primary Care Physician: Maryland Pink Other Clinician: Referring Physician: [place] Pink Treating Physician/Extender: Melburn Hake, HOYT Weeks in Treatment: 4 Active Problems Location of Pain Severity and Description of Pain Patient Has Paino No Site Locations Pain Management and Medication Current  Pain Management: Notes Topical or injectable lidocaine is offered to patient for acute pain when surgical debridement is performed. If needed, Patient is instructed to use over the counter pain medication for the following 24-48 hours after debridement. Wound care MDs do not prescribed pain medications. Patient has chronic pain or uncontrolled pain. Patient has been  instructed to make an appointment with their Primary Care Physician for pain management. Electronic Signature(s) Signed: 03/07/2016 4:06:00 PM By: Montey Hora Entered By: Montey Hora on 03/07/2016 12:53:30 Gloria Grimes ([id number]) -------------------------------------------------------------------------------- Patient/Caregiver Education Details Patient Name: Gloria Grimes Date of Service: 03/07/2016 12:45 PM Medical Record Number: [id number] Patient Account Number: [id number] Date of Birth/Gender: [birth year] (82 y.o. Female) Treating RN: Montey Hora Primary Care Physician: Maryland Pink Other Clinician: Referring Physician: [place] Pink Treating Physician/Extender: Sharalyn Ink in Treatment: 4 Education Assessment Education Provided To: Patient Education Topics Provided Venous: Handouts: Other: leg elevation Methods: Explain/Verbal Responses: State content correctly Electronic Signature(s) Signed: 03/07/2016 4:06:00 PM By: Montey Hora Entered By: Montey Hora on 03/07/2016 13:27:31 Gloria Grimes ([id number]) -------------------------------------------------------------------------------- Wound Assessment Details Patient Name: Gloria Grimes Date of Service: 03/07/2016 12:45 PM Medical Record Number: [id number] Patient Account Number: [id number] Date of Birth/Sex: 10/19/1930 (82 y.o. Female) Treating RN: Montey Hora Primary Care Physician: Maryland Pink Other Clinician: Referring Physician: [place] Pink Treating Physician/Extender: Melburn Hake, HOYT Weeks in Treatment: 4 Wound Status Wound Number: 1 Primary Trauma, Other Etiology: Wound Location: Right Lower Leg - Anterior Wound Status: Open Wounding Event: Trauma Comorbid Congestive Heart Failure, Date Acquired: 01/19/2016 History: Hypertension Weeks Of Treatment: 4 Clustered Wound: No Photos Wound Measurements Length: (cm) 0.5 Width: (cm) 0.6 Depth: (cm) 0.2 Area: (cm)  0.236 Volume: (cm) 0.047 % Reduction in Area: 92.1% % Reduction in Volume: 94.7% Epithelialization: Small (1-33%) Tunneling: No Undermining: No Wound Description Full Thickness Without Exposed Classification: Support Structures Wound Margin: Distinct, outline attached Exudate Large Amount: Exudate Type: Serosanguineous Exudate Color: red, brown Foul Odor After Cleansing: No Wound Bed Granulation Amount: Large (67-100%) Exposed Structure Granulation Quality: Red Fascia Exposed: No Necrotic Amount: Small (1-33%) Fat Layer Exposed: No Gloria Grimes, Gloria Grimes ([id number]) Necrotic Quality: Eschar, Adherent Slough Tendon Exposed: No Muscle Exposed: No Joint Exposed: No Bone Exposed: No Limited to Skin Breakdown Periwound Skin Texture Texture Color No Abnormalities Noted: No No Abnormalities Noted: No Localized Edema: Yes Erythema: Yes Erythema Location: Circumferential Moisture No Abnormalities Noted: No Temperature / Pain Maceration: Yes Temperature: No Abnormality Moist: Yes Tenderness on Palpation: Yes Wound Preparation Ulcer Cleansing: Other: soap and water, Topical Anesthetic Applied: Other: lidocaine 4%, Treatment Notes Wound #1 (Right, Anterior Lower Leg) 1. Cleansed with: Cleanse wound with antibacterial soap and water 2. Anesthetic Topical Lidocaine 4% cream to wound bed prior to debridement 4. Dressing Applied: Prisma Ag 5. Secondary Dressing Applied ABD Pad Dry Gauze 7. Secured with Other (specify in notes) Notes kerlix, coban wrap Electronic Signature(s) Signed: 03/07/2016 4:06:00 PM By: Montey Hora Entered By: Montey Hora on 03/07/2016 13:07:11 Gloria Grimes ([id number]) -------------------------------------------------------------------------------- Vitals Details Patient Name: Gloria Grimes Date of Service: 03/07/2016 12:45 PM Medical Record Number: [id number] Patient Account Number: [id number] Date of Birth/Sex: Dec 30, 1930 (82  y.o. Female) Treating RN: Montey Hora Primary Care Physician: Maryland Pink Other Clinician: Referring Physician: [place] Pink Treating Physician/Extender: Melburn Hake, HOYT Weeks in Treatment: 4 Vital Signs Time Taken: 12:53 Temperature (F): 98.2 Height (in): 62 Pulse (bpm): 103 Weight (lbs): 156.9 Respiratory Rate (breaths/min): 18 Body Mass Index (BMI): 28.7 Blood Pressure (mmHg): 145/61  Reference Range: 80 - 120 mg / dl Electronic Signature(s) Signed: 03/07/2016 4:06:00 PM By: Montey Hora Entered By: Montey Hora on 03/07/2016 12:53:51

## 2016-03-08 ENCOUNTER — Inpatient Hospital Stay
Admission: EM | Admit: 2016-03-08 | Discharge: 2016-03-10 | DRG: 871 | Disposition: A | Discharge status: Home Health | Payer: Medicare Other | Attending: Internal Medicine | Admitting: Internal Medicine

## 2016-03-08 ENCOUNTER — Encounter: Payer: Self-pay | Admitting: Medical Oncology

## 2016-03-08 ENCOUNTER — Inpatient Hospital Stay: Payer: Medicare Other

## 2016-03-08 ENCOUNTER — Emergency Department: Payer: Medicare Other

## 2016-03-08 DIAGNOSIS — Z91013 Allergy to seafood: Secondary | ICD-10-CM | POA: Diagnosis not present

## 2016-03-08 DIAGNOSIS — E871 Hypo-osmolality and hyponatremia: Secondary | ICD-10-CM | POA: Diagnosis present

## 2016-03-08 DIAGNOSIS — I429 Cardiomyopathy, unspecified: Secondary | ICD-10-CM | POA: Diagnosis present

## 2016-03-08 DIAGNOSIS — Z87891 Personal history of nicotine dependence: Secondary | ICD-10-CM | POA: Diagnosis not present

## 2016-03-08 DIAGNOSIS — I509 Heart failure, unspecified: Secondary | ICD-10-CM | POA: Diagnosis present

## 2016-03-08 DIAGNOSIS — J9601 Acute respiratory failure with hypoxia: Secondary | ICD-10-CM | POA: Diagnosis present

## 2016-03-08 DIAGNOSIS — A419 Sepsis, unspecified organism: Secondary | ICD-10-CM | POA: Diagnosis not present

## 2016-03-08 DIAGNOSIS — R0902 Hypoxemia: Secondary | ICD-10-CM

## 2016-03-08 DIAGNOSIS — Z90710 Acquired absence of both cervix and uterus: Secondary | ICD-10-CM | POA: Diagnosis not present

## 2016-03-08 DIAGNOSIS — J189 Pneumonia, unspecified organism: Secondary | ICD-10-CM | POA: Diagnosis present

## 2016-03-08 DIAGNOSIS — R0602 Shortness of breath: Secondary | ICD-10-CM | POA: Diagnosis not present

## 2016-03-08 DIAGNOSIS — Z66 Do not resuscitate: Secondary | ICD-10-CM | POA: Diagnosis present

## 2016-03-08 DIAGNOSIS — E876 Hypokalemia: Secondary | ICD-10-CM | POA: Diagnosis present

## 2016-03-08 DIAGNOSIS — Z79899 Other long term (current) drug therapy: Secondary | ICD-10-CM | POA: Diagnosis not present

## 2016-03-08 DIAGNOSIS — B999 Unspecified infectious disease: Secondary | ICD-10-CM

## 2016-03-08 DIAGNOSIS — M7989 Other specified soft tissue disorders: Secondary | ICD-10-CM | POA: Diagnosis present

## 2016-03-08 DIAGNOSIS — L97919 Non-pressure chronic ulcer of unspecified part of right lower leg with unspecified severity: Secondary | ICD-10-CM | POA: Diagnosis present

## 2016-03-08 DIAGNOSIS — Z880 Allergy status to penicillin: Secondary | ICD-10-CM | POA: Diagnosis not present

## 2016-03-08 DIAGNOSIS — R262 Difficulty in walking, not elsewhere classified: Secondary | ICD-10-CM

## 2016-03-08 HISTORY — DX: Malignant (primary) neoplasm, unspecified: C80.1

## 2016-03-08 LAB — CBC WITH DIFFERENTIAL/PLATELET
Basophils Absolute: 0 10*3/uL (ref 0–0.1)
Basophils Relative: 0 %
EOS ABS: 0 10*3/uL (ref 0–0.7)
EOS PCT: 0 %
HCT: 37 % (ref 35.0–47.0)
Hemoglobin: 12.7 g/dL (ref 12.0–16.0)
LYMPHS ABS: 1.2 10*3/uL (ref 1.0–3.6)
Lymphocytes Relative: 11 %
MCH: 29.3 pg (ref 26.0–34.0)
MCHC: 34.4 g/dL (ref 32.0–36.0)
MCV: 85.1 fL (ref 80.0–100.0)
MONO ABS: 1.9 10*3/uL — AB (ref 0.2–0.9)
MONOS PCT: 16 %
Neutro Abs: 8.3 10*3/uL — ABNORMAL HIGH (ref 1.4–6.5)
Neutrophils Relative %: 73 %
PLATELETS: 180 10*3/uL (ref 150–440)
RBC: 4.35 MIL/uL (ref 3.80–5.20)
RDW: 14.3 % (ref 11.5–14.5)
WBC: 11.5 10*3/uL — AB (ref 3.6–11.0)

## 2016-03-08 LAB — TROPONIN I: Troponin I: <0.03 ng/mL (ref <0.03)

## 2016-03-08 LAB — BASIC METABOLIC PANEL
Anion gap: 10 (ref 5–15)
BUN: 8 mg/dL (ref 6–20)
CO2: 31 mmol/L (ref 22–32)
CREATININE: 0.48 mg/dL (ref 0.44–1.00)
Calcium: 8.9 mg/dL (ref 8.9–10.3)
Chloride: 90 mmol/L — ABNORMAL LOW (ref 101–111)
GFR calc Af Amer: >60 mL/min (ref >60)
GLUCOSE: 150 mg/dL — AB (ref 65–99)
Potassium: 3.1 mmol/L — ABNORMAL LOW (ref 3.5–5.1)
SODIUM: 131 mmol/L — AB (ref 135–145)

## 2016-03-08 LAB — EXPECTORATED SPUTUM ASSESSMENT W REFEX TO RESP CULTURE

## 2016-03-08 LAB — LACTIC ACID, PLASMA
Lactic Acid, Venous: 1 mmol/L (ref 0.5–1.9)
Lactic Acid, Venous: 0.8 mmol/L (ref 0.5–1.9)

## 2016-03-08 LAB — BRAIN NATRIURETIC PEPTIDE: B NATRIURETIC PEPTIDE 5: 82 pg/mL (ref 0.0–100.0)

## 2016-03-08 MED ORDER — ACETAMINOPHEN 325 MG PO TABS: 650.0000 mg | ORAL_TABLET | Freq: Four times a day (QID) | ORAL | Status: DC | PRN

## 2016-03-08 MED ORDER — GUAIFENESIN ER 600 MG PO TB12
600.0000 mg | ORAL_TABLET | Freq: Two times a day (BID) | ORAL | Status: DC
Start: 2016-03-08 — End: 2016-03-10
  Administered 2016-03-08 – 2016-03-09 (×4): 600 mg via ORAL
  Filled 2016-03-08 (×5): qty 1

## 2016-03-08 MED ORDER — FUROSEMIDE 40 MG PO TABS
40.0000 mg | ORAL_TABLET | Freq: Every day | ORAL | Status: DC
  Administered 2016-03-08 – 2016-03-10 (×3): 40 mg via ORAL
  Filled 2016-03-08 (×3): qty 1

## 2016-03-08 MED ORDER — POTASSIUM CHLORIDE CRYS ER 20 MEQ PO TBCR
40.0000 meq | EXTENDED_RELEASE_TABLET | Freq: Once | ORAL | Status: AC
  Administered 2016-03-08: 40 meq via ORAL
  Filled 2016-03-08: qty 2

## 2016-03-08 MED ORDER — ESTROGENS CONJUGATED 0.625 MG PO TABS
0.6250 mg | ORAL_TABLET | Freq: Every day | ORAL | Status: DC
  Administered 2016-03-09 – 2016-03-10 (×2): 0.625 mg via ORAL
  Filled 2016-03-08 (×3): qty 1

## 2016-03-08 MED ORDER — CARVEDILOL 6.25 MG PO TABS
6.2500 mg | ORAL_TABLET | Freq: Two times a day (BID) | ORAL | Status: DC
  Administered 2016-03-08 – 2016-03-10 (×4): 6.25 mg via ORAL
  Filled 2016-03-08 (×4): qty 1

## 2016-03-08 MED ORDER — VITAMIN C 500 MG PO TABS
500.0000 mg | ORAL_TABLET | Freq: Every day | ORAL | Status: DC
  Administered 2016-03-08 – 2016-03-10 (×3): 500 mg via ORAL
  Filled 2016-03-08 (×3): qty 1

## 2016-03-08 MED ORDER — CEFTRIAXONE SODIUM IN DEXTROSE 20 MG/ML IV SOLN
1.0000 g | INTRAVENOUS | Status: DC
  Administered 2016-03-09 – 2016-03-10 (×2): 1 g via INTRAVENOUS
  Filled 2016-03-08 (×2): qty 50

## 2016-03-08 MED ORDER — DEXTROSE 5 % IV SOLN
500.0000 mg | INTRAVENOUS | Status: DC
  Filled 2016-03-08: qty 500

## 2016-03-08 MED ORDER — SODIUM CHLORIDE 0.9% FLUSH
3.0000 mL | Freq: Two times a day (BID) | INTRAVENOUS | Status: DC
  Administered 2016-03-08 – 2016-03-10 (×4): 3 mL via INTRAVENOUS

## 2016-03-08 MED ORDER — CEFTRIAXONE SODIUM IN DEXTROSE 20 MG/ML IV SOLN
1.0000 g | Freq: Once | INTRAVENOUS | Status: AC
  Administered 2016-03-08: 1 g via INTRAVENOUS
  Filled 2016-03-08: qty 50

## 2016-03-08 MED ORDER — HYDROCODONE-ACETAMINOPHEN 5-325 MG PO TABS: 1.0000 | ORAL_TABLET | ORAL | Status: DC | PRN

## 2016-03-08 MED ORDER — SENNOSIDES-DOCUSATE SODIUM 8.6-50 MG PO TABS: 1.0000 | ORAL_TABLET | Freq: Every evening | ORAL | Status: DC | PRN

## 2016-03-08 MED ORDER — LEVOFLOXACIN IN D5W 750 MG/150ML IV SOLN: 750.0000 mg | Freq: Once | INTRAVENOUS | Status: DC

## 2016-03-08 MED ORDER — BENZONATATE 100 MG PO CAPS
100.0000 mg | ORAL_CAPSULE | Freq: Two times a day (BID) | ORAL | Status: DC
  Administered 2016-03-08 – 2016-03-10 (×5): 100 mg via ORAL
  Filled 2016-03-08 (×5): qty 1

## 2016-03-08 MED ORDER — METHYLPREDNISOLONE SODIUM SUCC 125 MG IJ SOLR
125.0000 mg | Freq: Once | INTRAMUSCULAR | Status: AC
  Administered 2016-03-08: 125 mg via INTRAVENOUS
  Filled 2016-03-08: qty 2

## 2016-03-08 MED ORDER — DEXTROSE 5 % IV SOLN
500.0000 mg | Freq: Once | INTRAVENOUS | Status: AC
  Administered 2016-03-08: 500 mg via INTRAVENOUS
  Filled 2016-03-08: qty 500

## 2016-03-08 MED ORDER — ACETAMINOPHEN 650 MG RE SUPP: 650.0000 mg | Freq: Four times a day (QID) | RECTAL | Status: DC | PRN

## 2016-03-08 MED ORDER — ENOXAPARIN SODIUM 40 MG/0.4ML ~~LOC~~ SOLN
40.0000 mg | SUBCUTANEOUS | Status: DC
  Administered 2016-03-08: 21:00:00 40 mg via SUBCUTANEOUS
  Filled 2016-03-08 (×2): qty 0.4

## 2016-03-08 MED ORDER — SODIUM CHLORIDE 0.9 % IV SOLN
INTRAVENOUS | Status: DC
  Administered 2016-03-08 – 2016-03-09 (×2): via INTRAVENOUS

## 2016-03-08 NOTE — ED Notes (Signed)
Pt up to bathroom with restroom. O2 sats dropped to 84% on 4 L o2. With rest sats increased to 91% on 4L

## 2016-03-08 NOTE — Progress Notes (Signed)
Gloria Grimes ([id number]) Visit Report for 03/07/2016 Chief Complaint Document Details Patient Name: Gloria Grimes 03/07/2016 12:45 Date of Service: PM Medical Record [id number] Number: Patient Account Number: [id number] 04/27/1931 (82 y.o. Treating RN: Montey Hora Date of Birth/Sex: Female) Other Clinician: Primary Care Physician: [place] Pink Treating STONE III, HOYT Referring Physician: [place] Pink Physician/Extender: Weeks in Treatment: 4 Information Obtained from: Patient Chief Complaint Traumatic wound on her right lower leg Electronic Signature(s) Signed: 03/08/2016 9:39:47 AM By: Worthy Keeler PA-C Entered By: Worthy Keeler on 03/07/2016 17:52:34 Gloria Grimes ([id number]) -------------------------------------------------------------------------------- Debridement Details Patient Name: Gloria Grimes 03/07/2016 12:45 Date of Service: PM Medical Record [id number] Number: Patient Account Number: [id number] 09-19-1930 (82 y.o. Treating RN: Montey Hora Date of Birth/Sex: Female) Other Clinician: Primary Care Physician: [place] Pink Treating STONE III, HOYT Referring Physician: [place] Pink Physician/Extender: Weeks in Treatment: 4 Debridement Performed for Wound #1 Right,Anterior Lower Leg Assessment: Performed By: Physician STONE III, HOYT, Debridement: Debridement Pre-procedure Yes - 13:08 Verification/Time Out Taken: Start Time: 13:08 Pain Control: Lidocaine 4% Topical Solution Level: Skin/Subcutaneous Tissue Total Area Debrided (L x 0.5 (cm) x 0.6 (cm) = 0.3 (cm) W): Tissue and other Viable, Non-Viable, Eschar, Fibrin/Slough, Subcutaneous material debrided: Instrument: Curette Bleeding: Minimum Hemostasis Achieved: Pressure End Time: 13:11 Procedural Pain: 0 Post Procedural Pain: 0 Response to Treatment: Procedure was tolerated well Post Debridement Measurements of Total Wound Length: (cm) 0.5 Width: (cm) 0.6 Depth:  (cm) 0.2 Volume: (cm) 0.047 Character of Wound/Ulcer Post Improved Debridement: Severity of Tissue Post Debridement: Fat layer exposed Post Procedure Diagnosis Same as Pre-procedure Electronic Signature(s) Signed: 03/07/2016 4:06:00 PM By: Montey Hora Signed: 03/08/2016 9:39:47 AM By: Bascom Levels, [place] ([id number]) Entered By: Montey Hora on 03/07/2016 13:09:39 Gloria Grimes ([id number]) -------------------------------------------------------------------------------- HPI Details Patient Name: Gloria Grimes 03/07/2016 12:45 Date of Service: PM Medical Record [id number] Number: Patient Account Number: [id number] [birth year] (82 y.o. 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 She lives at home with her husband who is had recent health problems himself. In any case she was making a bed in her home 2 weeks ago and managed to traumatized her right lower leg on the handle of her reclining chair. Per the patient's description this was a punched-out wound over they made some attempt to suture this together with 7 sutures. She was given a course of oral antibiotics. Her primary physician remove the sutures 7 days later however the wound had already dehisced. Since then she's been using antibiotic ointment and nonadherent pad and some form of wrap applied at an urgent care. She has no prior wound history. She is receiving to rounds of oral antibiotics finishing today. She is not a diabetic and is a long standing ex-smoker. Her only other primary medical diagnosis is congestive heart failure. The ABI's in this clinic was 0.7  on the right 0.8 on the left 02/09/16; she arrives today with not much change in the wound surface. Still an adherent slough although this is better than last week. Clearly the decision to use Prisma on this was not the right decision, we'll change to Santyl this week. 02/16/16; the wound is smaller still with some depth with adherent slough. Using [facility] 02/22/16;  the wound seems to be smaller less adherent slough it. She has been using Santyl for 2 weeks 02/29/16 patient presents today for a follow-up visit concerning her ongoing right anterior shin traumatic wound. She tells me this point time that she is having only minimal discomfort 1 out of 10 described as a burning sensation occasionally. She has been tolerating the Kerlix and Coban wraps at this point in time very well. Her biggest question is whether or not there is anything she can do to help this in particular I explained that the best thing she can do is keep her wrap dry and otherwise follow the recommendations that been made up to this point. She does have bilateral lower extremity edema which is controlled with wrapping at this point in time on the right. She questions if compression stockings on the left also be of benefit. 03/07/16 patient presents today for a follow-up appointment concerning her ongoing right anterior leg ulcer. Currently she seems to be doing well with the compression wrapping at this point in time. She still states tells me that the pain as a 1 out of 10 at most. We did discuss today trying to improve some of the compression that she is experiencing. However she is unsure that she will be able to tolerate any additional compression as it gets too tight and actually hurts her leg when her leg swells. Electronic Signature(s) Signed: 03/08/2016 9:39:47 AM By: Worthy Keeler PA-C Entered By: Worthy Keeler on 03/07/2016 17:54:34 Gloria Grimes  ([id number]) -------------------------------------------------------------------------------- Physical Exam Details Patient Name: Gloria Grimes 03/07/2016 12:45 Date of Service: PM Medical Record [id number] Number: Patient Account Number: [id number] 1931/03/04 (82 y.o. Treating RN: Montey Hora Date of Birth/Sex: Female) Other Clinician: Primary Care Physician: [place] Pink Treating STONE III, HOYT Referring Physician: [place] Pink Physician/Extender: Weeks in Treatment: 4 Constitutional Well-nourished and well-hydrated in no acute distress. Respiratory normal breathing without difficulty. Cardiovascular 1+ pitting edema noted of the right lower extremity. Psychiatric this patient is able to make decisions and demonstrates good insight into disease process. Alert and Oriented x 3. pleasant and cooperative. Electronic Signature(s) Signed: 03/08/2016 9:39:47 AM By: Worthy Keeler PA-C Entered By: Worthy Keeler on 03/07/2016 17:55:28 NALAH, HAAN ([id number]) -------------------------------------------------------------------------------- Physician Orders Details Patient Name: LOZA, BONDER 03/07/2016 12:45 Date of Service: PM Medical Record [id number] Number: Patient Account Number: [id number] 1931-03-24 (82 y.o. Treating RN: Montey Hora Date of Birth/Sex: Female) Other Clinician: Primary Care Physician: [place] Pink Treating STONE III, HOYT Referring Physician: [place] Pink Physician/Extender: Weeks in Treatment: 4 Verbal / Phone Orders: Yes Clinician: Montey Hora Read Back and Verified: Yes Diagnosis Coding Wound Cleansing Wound #1 Right,Anterior Lower Leg o Cleanse wound with mild soap and water o May shower with protection. o No tub bath. Anesthetic Wound #1 Right,Anterior Lower Leg o Topical Lidocaine 4% cream applied to wound bed prior to debridement Skin Barriers/Peri-Wound Care Wound #1 Right,Anterior Lower Leg o  Barrier cream o Moisturizing lotion o Other: - TCA Primary Wound Dressing Wound #1 Right,Anterior Lower Leg o Prisma Ag Secondary Dressing Wound #1 Right,Anterior Lower Leg o Dry Gauze Dressing Change Frequency Wound #1 Right,Anterior Lower Leg o Change dressing every week Follow-up Appointments Wound #1 Right,Anterior Lower Leg o Return Appointment in 1 week. Edema Control LAYA, BACHE. ([id number]) Wound #1 Right,Anterior Lower Leg o Elevate legs to the level of the heart and pump ankles as often as possible o Other: - Kerlix and Coban unna to anchor Additional  Orders / Instructions Wound #1 Right,Anterior Lower Leg o Increase protein intake. Electronic Signature(s) Signed: 03/07/2016 4:06:00 PM By: Montey Hora Signed: 03/08/2016 9:39:47 AM By: Worthy Keeler PA-C Entered By: Montey Hora on 03/07/2016 13:14:29 JASIEL, FLORENCIO ([id number]) -------------------------------------------------------------------------------- Problem List Details Patient Name: DYMONIQUE, GILLEN 03/07/2016 12:45 Date of Service: PM Medical Record [id number] Number: Patient Account Number: [id number] 1930/10/17 (82 y.o. Treating RN: Montey Hora Date of Birth/Sex: Female) Other Clinician: Primary Care Physician: [place] Pink Treating STONE III, HOYT Referring Physician: [place] Pink Physician/Extender: Weeks in Treatment: 4 Active Problems ICD-10 Encounter Code Description Active Date Diagnosis S81.811S Laceration without foreign body, right lower leg, sequela 02/02/2016 Yes I87.301 Chronic venous hypertension (idiopathic) without 0000000 Yes complications of right lower extremity Inactive Problems Resolved Problems Electronic Signature(s) Signed: 03/08/2016 9:39:47 AM By: Worthy Keeler PA-C Entered By: Worthy Keeler on 03/07/2016 17:52:22 FILZA, STEFFENS  ([id number]) -------------------------------------------------------------------------------- Progress Note Details Patient Name: CLERISSA, BAFFA 03/07/2016 12:45 Date of Service: PM Medical Record [id number] Number: Patient Account Number: [id number] 03-09-1931 (82 y.o. Treating RN: Montey Hora Date of Birth/Sex: Female) Other Clinician: Primary Care Physician: [place] Pink Treating STONE III, HOYT Referring Physician: [place] Pink Physician/Extender: Weeks in Treatment: 4 Subjective Chief Complaint Information obtained from Patient Traumatic wound on her right lower leg History of Present Illness (HPI) 02/02/16; very pleasant 82 year old woman initially from 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 She lives at home with her husband who is had recent health problems himself. In any case she was making a bed in her home 2 weeks ago and managed to traumatized her right lower leg on the handle of her reclining chair. Per the patient's description this was a punched-out wound over they made some attempt to suture this together with 7 sutures. She was given a course of oral antibiotics. Her primary physician remove the sutures 7 days later however the wound had already dehisced. Since then she's been using antibiotic ointment and nonadherent pad and some form of wrap applied at an urgent care. She has no prior wound history. She is receiving to rounds of oral antibiotics finishing today. She is not a diabetic and is a long standing ex-smoker. Her only other primary medical diagnosis is congestive heart failure. The ABI's in this clinic was 0.7 on the right 0.8 on the left 02/09/16; she arrives today with not much change in the wound surface. Still an adherent slough although this is better than last week. Clearly the decision to use Prisma on this was not the right decision, we'll change to Santyl this week. 02/16/16; the wound is smaller still with  some depth with adherent slough. Using Santyl 02/22/16; the wound seems to be smaller less adherent slough it. She has been using Santyl for 2 weeks 02/29/16 patient presents today for a follow-up visit concerning her ongoing right anterior shin traumatic wound. She tells me this point time that she is having only minimal discomfort 1 out of 10 described as a burning sensation occasionally. She has been tolerating the Kerlix and Coban wraps at this point in time very well. Her biggest question is whether or not there is anything she can do to help this in particular I explained that the best thing she can do is keep her wrap dry and otherwise follow the recommendations that been made up to this point. She does have bilateral lower extremity edema which is controlled with wrapping at this point in time on the right.  She questions if compression stockings on the left also be of benefit. 03/07/16 patient presents today for a follow-up appointment concerning her ongoing right anterior leg ulcer. Currently she seems to be doing well with the compression wrapping at this point in time. She still states tells me that the pain as a 1 out of 10 at most. We did discuss today trying to improve some of the compression that she is experiencing. However she is unsure that she will be able to tolerate any additional compression as it gets too tight and actually hurts her leg when her leg swells. WILSIE, FORBUS ([id number]) Objective Constitutional Well-nourished and well-hydrated in no acute distress. Vitals Time Taken: 12:53 PM, Height: 62 in, Weight: 156.9 lbs, BMI: 28.7, Temperature: 98.2 F, Pulse: 103 bpm, Respiratory Rate: 18 breaths/min, Blood Pressure: 145/61 mmHg. Respiratory normal breathing without difficulty. Cardiovascular 1+ pitting edema noted of the right lower extremity. Psychiatric this patient is able to make decisions and demonstrates good insight into disease process. Alert  and Oriented x 3. pleasant and cooperative. Integumentary (Hair, Skin) Wound #1 status is Open. Original cause of wound was Trauma. The wound is located on the Right,Anterior Lower Leg. The wound measures 0.5cm length x 0.6cm width x 0.2cm depth; 0.236cm^2 area and 0.047cm^3 volume. The wound is limited to skin breakdown. There is no tunneling or undermining noted. There is a large amount of serosanguineous drainage noted. The wound margin is distinct with the outline attached to the wound base. There is large (67-100%) red granulation within the wound bed. There is a small (1-33%) amount of necrotic tissue within the wound bed including Eschar and Adherent Slough. The periwound skin appearance exhibited: Localized Edema, Maceration, Moist, Erythema. The surrounding wound skin color is noted with erythema which is circumferential. Periwound temperature was noted as No Abnormality. The periwound has tenderness on palpation. Assessment Active Problems ICD-10 S81.811S - Laceration without foreign body, right lower leg, sequela I87.301 - Chronic venous hypertension (idiopathic) without complications of right lower extremity RIANE, ARCEO. ([id number]) Procedures Wound #1 Wound #1 is a Trauma, Other located on the Right,Anterior Lower Leg . There was a Skin/Subcutaneous Tissue Debridement [id number]) debridement with total area of 0.3 sq cm performed by STONE III, HOYT. with the following instrument(s): Curette to remove Viable and Non-Viable tissue/material including Fibrin/Slough, Eschar, and Subcutaneous after achieving pain control using Lidocaine 4% Topical Solution. A time out was conducted at 13:08, prior to the start of the procedure. A Minimum amount of bleeding was controlled with Pressure. The procedure was tolerated well with a pain level of 0 throughout and a pain level of 0 following the procedure. Post Debridement Measurements: 0.5cm length x 0.6cm width x 0.2cm  depth; 0.047cm^3 volume. Character of Wound/Ulcer Post Debridement is improved. Severity of Tissue Post Debridement is: Fat layer exposed. Post procedure Diagnosis Wound #1: Same as Pre-Procedure Plan Wound Cleansing: Wound #1 Right,Anterior Lower Leg: Cleanse wound with mild soap and water May shower with protection. No tub bath. Anesthetic: Wound #1 Right,Anterior Lower Leg: Topical Lidocaine 4% cream applied to wound bed prior to debridement Skin Barriers/Peri-Wound Care: Wound #1 Right,Anterior Lower Leg: Barrier cream Moisturizing lotion Other: - TCA Primary Wound Dressing: Wound #1 Right,Anterior Lower Leg: Prisma Ag Secondary Dressing: Wound #1 Right,Anterior Lower Leg: Dry Gauze Dressing Change Frequency: Wound #1 Right,Anterior Lower Leg: Change dressing every week Follow-up Appointments: Wound #1 Right,Anterior Lower Leg: Return Appointment in 1 week. MEMOREE, RAVIS ([id number]) Edema Control: Wound #1 Right,Anterior Lower Leg:  Elevate legs to the level of the heart and pump ankles as often as possible Other: - Kerlix and Coban unna to anchor Additional Orders / Instructions: Wound #1 Right,Anterior Lower Leg: Increase protein intake. Follow-Up Appointments: A follow-up appointment should be scheduled. A Patient Clinical Summary of Care was provided to JF At this point in time today I did actually performed debridement and this wound cleaned up very well at this point in time. Overall it seems that Mrs. Marcoux is doing okay although the wound did not progress significantly at this point in time compared to last week in size it did appear to be cleaner. For that reason ongoing actually continue with the current recommendations as far as dressing which will be the Prisma AG as well as the Kerlex and Coban wraps. we discussed potential stronger compression although she does not feel like she can tolerate it and I was considering Iodosorb/Iodoflex however she has  a shellfish allergy which may be somewhat leery of this. We will see where things appear in the next week when we see her for follow-up. Otherwise if she has any other concerns in the meantime she knows that she can always contact the office and let us know. Electronic Signature(s) Signed: 03/08/2016 9:39:47 AM By: Worthy Keeler PA-C Entered By: Worthy Keeler on 03/07/2016 17:57:38 ESTEBAN, SALERA ([id number]) -------------------------------------------------------------------------------- SuperBill Details Patient Name: [name] Date of Service: 03/07/2016 Medical Record Number: [id number] Patient Account Number: [id number] Date of Birth/Sex: Jan 03, 1931 (82 y.o. Female) Treating RN: Montey Hora Primary Care Physician: [place] Pink Other Clinician: Referring Physician: [place] Pink Treating Physician/Extender: Melburn Hake, HOYT Weeks in Treatment: 4 Diagnosis Coding ICD-10 Codes Code Description [phone] Laceration without foreign body, right lower leg, sequela I87.301 Chronic venous hypertension (idiopathic) without complications of right lower extremity Facility Procedures CPT4: Description Modifier Quantity Code [id number] - DEB SUBQ TISSUE 20 SQ CM/< 1 ICD-10 Description Diagnosis S81.811S Laceration without foreign body, right lower leg, sequela I87.301 Chronic venous hypertension (idiopathic) without complications  of right lower extremity Physician Procedures CPT4: Description Modifier Quantity Code [id number] - WC PHYS SUBQ TISS 20 SQ CM 1 ICD-10 Description Diagnosis S81.811S Laceration without foreign body, right lower leg, sequela I87.301 Chronic venous hypertension (idiopathic) without complications  of right lower extremity Electronic Signature(s) Signed: 03/08/2016 9:39:47 AM By: Worthy Keeler PA-C Entered By: Worthy Keeler on 03/07/2016 17:57:55

## 2016-03-08 NOTE — Consult Note (Signed)
[place] Nurse wound consult note Reason for Consult: trauma wound to right anterior pretibial leg.  Fell against chair. Has been going to wound care center.  Wound type: Nonhealing trauma Pressure Ulcer POA: N/A Measurement:0.5 cm x 0.5 cm x 1 cm packed with Iodorform gauze. Wound bed:Red Drainage (amount, consistency, odor) Minimal serosanguinous Periwound:Intact Dressing procedure/placement/frequency:Cleanse wound to right leg with NS.  Gently fill wound with Iodoform gauze.  Zinc layer, secured with nonadherent COban.  Stocking.  Change weekly at [facility].  Will not follow at this time.  Please re-consult if needed.  Domenic Moras RN BSN [place] Pager [phone]

## 2016-03-08 NOTE — H&P (Addendum)
[facility] [place] NAME: Gloria Grimes    MR#:  [id number]  DATE OF BIRTH:  09/22/1930  DATE OF ADMISSION:  03/08/2016  PRIMARY CARE PHYSICIAN: Maryland Pink, MD   REQUESTING/REFERRING PHYSICIAN: dr Reita Cliche  CHIEF COMPLAINT:    Shortness of breath and cough HISTORY OF PRESENT ILLNESS:  Gloria Grimes  is a 82 y.o. female with a known history of Right leg wound evaluated at wound care center and idiopathic cardiomyopathy resolved who presents with cough, fever, chills and purulent sputum since Sunday. Patient saw a PA on Monday who diagnosed her with allergies. It appears that every time this year patient has similar symptoms which were contributed to allergies. She presents today however due to worsening of her symptoms. Chest x-ray shows bronchitis versus pneumonia. She reports she has had purulent green sputum. She denies sick contacts or travel history. She has also had wheezing over the past day. She is currently being followed at the wound care center for a right leg wound. She denies nausea or vomiting or chest pain.  She is started on Rocephin and azithromycin and given 1 dose of IV steroids for wheezing.  PAST MEDICAL HISTORY:   Past Medical History:  Diagnosis Date  . CHF (congestive heart failure) ([place])  Resolved      PAST SURGICAL HISTORY:   Past Surgical History:  Procedure Laterality Date  . ABDOMINAL HYSTERECTOMY      SOCIAL HISTORY:   Social History  Substance Use Topics  . Smoking status: Former Research scientist (life sciences)  . Smokeless tobacco: Never Used  . Alcohol use No     Comment: wine    FAMILY HISTORY:    DRUG ALLERGIES:   Allergies  Allergen Reactions  . Penicillins   . Shellfish Allergy     REVIEW OF SYSTEMS:   Review of Systems  Constitutional: Negative.  Negative for chills, fever and malaise/fatigue.  HENT: Negative.  Negative for ear discharge, ear pain, hearing loss, nosebleeds and sore throat.   Eyes:  Negative.  Negative for blurred vision and pain.  Respiratory: Positive for cough, sputum production, shortness of breath and wheezing. Negative for hemoptysis.   Cardiovascular: Negative.  Negative for chest pain, palpitations and leg swelling.  Gastrointestinal: Negative.  Negative for abdominal pain, blood in stool, diarrhea, nausea and vomiting.  Genitourinary: Negative.  Negative for dysuria.  Musculoskeletal: Negative.  Negative for back pain.  Skin: Negative.        Right shin ulcer  Neurological: Negative for dizziness, tremors, speech change, focal weakness, seizures and headaches.  Endo/Heme/Allergies: Negative.  Does not bruise/bleed easily.  Psychiatric/Behavioral: Negative.  Negative for depression, hallucinations and suicidal ideas.    MEDICATIONS AT HOME:   Prior to Admission medications   Medication Sig Start Date End Date Taking? Authorizing Provider  ascorbic acid (VITAMIN C) 500 MG tablet Take 500 mg by mouth daily.   Yes Historical Provider, MD  carvedilol (COREG) 6.25 MG tablet Take 6.25 mg by mouth 2 (two) times daily with a meal.   Yes Historical Provider, MD  estrogens, conjugated, (PREMARIN) 0.625 MG tablet Take 0.625 mg by mouth daily.   Yes Historical Provider, MD  furosemide (LASIX) 40 MG tablet Take 40 mg by mouth daily.   Yes Historical Provider, MD      VITAL SIGNS:  Blood pressure (!) 166/77, pulse (!) 110, temperature 98.7 F (37.1 C), temperature source Oral, resp. rate (!) 21, height 5\' 2"  (1.575 m), weight 70.3 kg (  155 lb), SpO2 (!) 88 %.  PHYSICAL EXAMINATION:   Physical Exam  Constitutional: She is oriented to person, place, and time and well-developed, well-nourished, and in no distress. No distress.  HENT:  Head: Normocephalic.  Eyes: No scleral icterus.  Neck: Normal range of motion. Neck supple. No JVD present. No tracheal deviation present.  Cardiovascular: Normal rate and regular rhythm.  Exam reveals no gallop and no friction rub.    Murmur heard. Pulmonary/Chest: Effort normal. No respiratory distress. She has wheezes. She has no rales. She exhibits no tenderness.  B/l rhonchii  Abdominal: Soft. Bowel sounds are normal. She exhibits no distension and no mass. There is no tenderness. There is no rebound and no guarding.  Musculoskeletal: Normal range of motion. She exhibits edema.  Neurological: She is alert and oriented to person, place, and time.  Skin: Skin is warm. No erythema.  Small ulcer packed on right shin no surrounding cellulitis  Psychiatric: Affect and judgment normal.      LABORATORY PANEL:   CBC  Recent Labs Lab 03/08/16 0831  WBC 11.5*  HGB 12.7  HCT 37.0  PLT 180   ------------------------------------------------------------------------------------------------------------------  Chemistries   Recent Labs Lab 03/08/16 0831  NA 131*  K 3.1*  CL 90*  CO2 31  GLUCOSE 150*  BUN 8  CREATININE 0.48  CALCIUM 8.9   ------------------------------------------------------------------------------------------------------------------  Cardiac Enzymes  Recent Labs Lab 03/08/16 0831  TROPONINI <0.03   ------------------------------------------------------------------------------------------------------------------  RADIOLOGY:  Dg Chest 2 View  Result Date: 03/08/2016 CLINICAL DATA:  82 year old female with worsening productive cough since yesterday evening. Difficulty breathing. EXAM: CHEST  2 VIEW COMPARISON:  No priors. FINDINGS: Diffuse peribronchial cuffing. Mild diffuse interstitial prominence, predominantly throughout the mid to lower lungs bilaterally. No confluent consolidative airspace disease. No pleural effusions. Lungs appear mildly hyperexpanded. No evidence of pulmonary edema. Heart size is normal. Upper mediastinal contours are within normal limits. Aortic atherosclerosis. IMPRESSION: 1. The appearance the chest suggests bronchitis, as above. 2. Mild hyperexpansion of the  lungs could suggest some associated air trapping. 3. Aortic atherosclerosis. Electronically Signed   By: Vinnie Langton M.D.   On: 03/08/2016 09:22    EKG:    IMPRESSION AND PLAN:   82 year old female who presents with acute hypoxic respiratory failure due to sepsis and community-acquired pneumonia.  1. Acute hypoxic respiratory failure in the setting of community-acquired pneumonia: Plan as outlined below for pneumonia. Wean oxygen as tolerated. Patient given 1 dose of IV steroids in the emergency room. Reevaluate in a.m. for further need for steroids. 2. Community-acquired pneumonia: Continue Rocephin and azithromycin. Follow up on sputum culture.   3. Sepsis: This is due to problem #2.  Follow up on cultures and continue IV fluids. And antibiotics.   4. Hypokalemia: This was repleted  5. Shin ulcer: Obtain x-ray to evaluate for osteomyelitis. Wound care consult  6. Idiopathic cardiomyopathy now resolved as per Dr. Bethanne Ginger evaluation in 2016 with [place]. Continue Lasix and patient would benefit from compression stockings.  All the records are reviewed and case discussed with ED provider. Management plans discussed with the patient and she in agreement  CODE STATUS: DNR  TOTAL TIME TAKING CARE OF THIS PATIENT: 45 minutes.    Jaelynn Pozo M.D on 03/08/2016 at 11:11 AM  Between 7am to 6pm - Pager - [phone]  After 6pm go to [email or web address] - password [email or web address] [email or web address]  Office  [phone]  CC: Primary care physician; Maryland Pink, MD

## 2016-03-08 NOTE — Progress Notes (Signed)
Pharmacy Antibiotic Note  Gloria Grimes is a 82 y.o. female admitted on 03/08/2016 with pneumonia/?CAP.  Pharmacy has been consulted for Ceftriaxone dosing.  Plan: Will continue with Ceftriaxone 1 gram IV q24h. (patient also on Azithromycin ) CXR= bronchitis vs PNA   Height: 5\' 2"  (157.5 cm) Weight: 155 lb (70.3 kg) IBW/kg (Calculated) : 50.1  Temp (24hrs), Avg:98.4 F (36.9 C), Min:98 F (36.7 C), Max:98.7 F (37.1 C)   Recent Labs Lab 03/08/16 0831 03/08/16 0832  WBC 11.5*  --   CREATININE 0.48  --   LATICACIDVEN  --  1.0    Estimated Creatinine Clearance: 49 mL/min (by C-G formula based on SCr of 0.48 mg/dL).    Allergies  Allergen Reactions  . Penicillins   . Shellfish Allergy     Antimicrobials this admission: CTX 10/11 >>   Azith 10/11 >>    Dose adjustments this admission:    Microbiology results: 10/11 BCx: P   UCx:     ? Sputum:      MRSA PCR:    Thank you for allowing pharmacy to be a part of this patient's care.  Yoceline Bazar A 03/08/2016 12:33 PM

## 2016-03-08 NOTE — ED Triage Notes (Signed)
Pt from home via ems with reports that pt was seen by PCP Monday for cough and shortness of breath, since then pt has continued to have worsened sob. Pt also reports that she has been having BLE swelling.

## 2016-03-08 NOTE — ED Provider Notes (Signed)
[facility] Emergency Department Provider Note ____________________________________________   I have reviewed the triage vital signs and the triage nursing note.  HISTORY  Chief Complaint Shortness of Breath; Leg Swelling; and Cough   Historian Patient  HPI Gloria Grimes is a 82 y.o. female with history of CHF, no known copd or asthma or inhaler use, states she started feeling short of breath on Monday and saw a PA at her PMD's office and was diagnosed with allergies.  She has had worsening sob, with yellow sputum production.  No chest pain.  Positive for trouble walking, dyspnea with exertion.  Incr swelling to bilat le.  Denies hx of afib.  No fever. No syncope.  Received duoneb by ems after found hypoxic to mid80's ra and severe wheezing.  Feels somewhat improved and is on 2 l [place] now.    Past Medical History:  Diagnosis Date  . CHF (congestive heart failure) [facility])     Patient Active Problem List   Diagnosis Date Noted  . Sepsis ([place]) 03/08/2016    Past Surgical History:  Procedure Laterality Date  . ABDOMINAL HYSTERECTOMY      Prior to Admission medications   Medication Sig Start Date End Date Taking? Authorizing Provider  ascorbic acid (VITAMIN C) 500 MG tablet Take 500 mg by mouth daily.   Yes Historical Provider, MD  carvedilol (COREG) 6.25 MG tablet Take 6.25 mg by mouth 2 (two) times daily with a meal.   Yes Historical Provider, MD  estrogens, conjugated, (PREMARIN) 0.625 MG tablet Take 0.625 mg by mouth daily.   Yes Historical Provider, MD  furosemide (LASIX) 40 MG tablet Take 40 mg by mouth daily.   Yes Historical Provider, MD    Allergies  Allergen Reactions  . Penicillins   . Shellfish Allergy     No family history on file.  Social History Social History  Substance Use Topics  . Smoking status: Former Research scientist (life sciences)  . Smokeless tobacco: Never Used  . Alcohol use No     Comment: wine    Review of Systems  Constitutional: Negative  for fever. Eyes: Negative for visual changes. ENT: Negative for sore throat. Cardiovascular: Negative for chest pain. Respiratory: Pos for shortness of breath. Gastrointestinal: Negative for abdominal pain, vomiting and diarrhea. Genitourinary: Negative for dysuria. Musculoskeletal: Negative for back pain. Skin: Negative for rash. Neurological: Negative for headache. 10 point Review of Systems otherwise negative ____________________________________________   PHYSICAL EXAM:  VITAL SIGNS: ED Triage Vitals  Enc Vitals Group     BP 03/08/16 0829 (!) 168/79     Pulse Rate 03/08/16 0829 (!) 110     Resp 03/08/16 0829 (!) 24     Temp 03/08/16 0829 98.7 F (37.1 C)     Temp Source 03/08/16 0829 Oral     SpO2 03/08/16 0824 (!) 82 %     Weight 03/08/16 0830 155 lb (70.3 kg)     Height 03/08/16 0830 5\' 2"  (1.575 m)     Head Circumference --      Peak Flow --      Pain Score --      Pain Loc --      Pain Edu? --      Excl. in [place]? --      Constitutional: Alert and oriented. Moderate respiratory distress HEENT   Head: Normocephalic and atraumatic.      Eyes: Conjunctivae are normal. PERRL. Normal extraocular movements.      Ears:  Nose: No congestion/rhinnorhea.   Mouth/Throat: Mucous membranes are moist.   Neck: No stridor. Cardiovascular/Chest: Irregularly irregular and tachycardic.  No murmurs, rubs, or gallops. Respiratory: Some tachypnea without retractions. Moderate and expiratory wheezing in all fields. Positive for rhonchi. Positive for yellow sputum production with cough. Gastrointestinal: Soft. No distention, no guarding, no rebound. Nontender.    Genitourinary/rectal:Deferred Musculoskeletal: Nontender with normal range of motion in all extremities. No joint effusions.  No lower extremity tenderness. 2+ le edema. Neurologic:  Normal speech and language. No gross or focal neurologic deficits are appreciated. Skin:  Skin is warm, dry and intact. No rash  noted. Psychiatric: Mood and affect are normal. Speech and behavior are normal. Patient exhibits appropriate insight and judgment.   ____________________________________________  LABS (pertinent positives/negatives)  Labs Reviewed  CBC WITH DIFFERENTIAL/PLATELET - Abnormal; Notable for the following:       Result Value   WBC 11.5 (*)    Neutro Abs 8.3 (*)    Monocytes Absolute 1.9 (*)    All other components within normal limits  BASIC METABOLIC PANEL - Abnormal; Notable for the following:    Sodium 131 (*)    Potassium 3.1 (*)    Chloride 90 (*)    Glucose, Bld 150 (*)    All other components within normal limits  CULTURE, BLOOD (ROUTINE X 2)  CULTURE, BLOOD (ROUTINE X 2)  BRAIN NATRIURETIC PEPTIDE  TROPONIN I  LACTIC ACID, PLASMA  LACTIC ACID, PLASMA    ____________________________________________    EKG I, Lisa Roca, MD, the attending physician have personally viewed and interpreted all ECGs.  Irregularly irregular and tachycardic. Narrow qrs. Normal axis.  Nonspecific st and t wave.   ____________________________________________  RADIOLOGY All Xrays were viewed by me. Imaging interpreted by Radiologist.  CXR:  IMPRESSION: 1. The appearance the chest suggests bronchitis, as above. 2. Mild hyperexpansion of the lungs could suggest some associated air trapping. 3. Aortic atherosclerosis. __________________________________________  PROCEDURES  Procedure(s) performed: None  Critical Care performed: CRITICAL CARE Performed by: Lisa Roca   Total critical care time: 30 minutes  Critical care time was exclusive of separately billable procedures and treating other patients.  Critical care was necessary to treat or prevent imminent or life-threatening deterioration.  Critical care was time spent personally by me on the following activities: development of treatment plan with patient and/or surrogate as well as nursing, discussions with consultants,  evaluation of patient's response to treatment, examination of patient, obtaining history from patient or surrogate, ordering and performing treatments and interventions, ordering and review of laboratory studies, ordering and review of radiographic studies, pulse oximetry and re-evaluation of patient's condition.   ____________________________________________   ED COURSE / ASSESSMENT AND PLAN  Pertinent labs & imaging results that were available during my care of the patient were reviewed by me and considered in my medical decision making (see chart for details).   Patient was hypoxic on room air here to mid-80s, on 2 L nasal cannula at 92%. She was wheezing quite a bit, and received DuoNeb treatment. No known history of wheezing. Chest x-ray without focal pneumonia, however due to hypoxia, yellow sputum production, and elevated white blood cell count, and tachycardia, I will treat her for possible community acquired pneumonia and early sepsis. Fluid resuscitation clinically based on good blood pressure and no elevated lactate.  Discussed risks versus benefit of antibiotic choice, patient states that she has tolerated Keflex before, and her penicillin allergy was that she slept for many hours. I think  it is okay for Rocephin and azithromycin. I am treating her with Solu-Medrol for the wheezing/bronchospasm component clinically.    CONSULTATIONS:  Dr. Benjie Karvonen, hospitalist for admission.   Patient / Family / Caregiver informed of clinical course, medical decision-making process, and agree with plan.    ___________________________________________   FINAL CLINICAL IMPRESSION(S) / ED DIAGNOSES   Final diagnoses:  Hypoxia  Hypokalemia  Hyponatremia  Community acquired pneumonia, unspecified laterality              Note: This dictation was prepared with Sales executive. Any transcriptional errors that result from this process are unintentional    Lisa Roca, MD 03/08/16  1329

## 2016-03-09 ENCOUNTER — Inpatient Hospital Stay: Payer: Medicare Other

## 2016-03-09 LAB — MAGNESIUM: Magnesium: 2.1 mg/dL (ref 1.7–2.4)

## 2016-03-09 LAB — BASIC METABOLIC PANEL
ANION GAP: 9 (ref 5–15)
BUN: 11 mg/dL (ref 6–20)
CO2: 30 mmol/L (ref 22–32)
Calcium: 9.3 mg/dL (ref 8.9–10.3)
Chloride: 98 mmol/L — ABNORMAL LOW (ref 101–111)
Creatinine, Ser: 0.49 mg/dL (ref 0.44–1.00)
GFR calc Af Amer: >60 mL/min (ref >60)
GLUCOSE: 137 mg/dL — AB (ref 65–99)
POTASSIUM: 3.9 mmol/L (ref 3.5–5.1)
Sodium: 137 mmol/L (ref 135–145)

## 2016-03-09 LAB — PROCALCITONIN

## 2016-03-09 MED ORDER — AZITHROMYCIN 500 MG PO TABS
500.0000 mg | ORAL_TABLET | Freq: Every day | ORAL | Status: DC
  Administered 2016-03-09 – 2016-03-10 (×2): 500 mg via ORAL
  Filled 2016-03-09 (×2): qty 1

## 2016-03-09 MED ORDER — FUROSEMIDE 10 MG/ML IJ SOLN
20.0000 mg | Freq: Once | INTRAMUSCULAR | Status: AC
  Administered 2016-03-09: 20 mg via INTRAVENOUS
  Filled 2016-03-09: qty 2

## 2016-03-09 NOTE — Progress Notes (Signed)
SATURATION QUALIFICATIONS: (This note is used to comply with regulatory documentation for home oxygen)  Patient Saturations on Room Air at Rest =87%  Patient Saturations on Room Air while Ambulating = 83%  Patient Saturations on2 Liters of oxygen while Ambulating = 91%  Please briefly explain why patient needs home oxygen:recovers at rest on 2l [place]  To 97%

## 2016-03-09 NOTE — Evaluation (Signed)
Physical Therapy Evaluation Patient Details Name: Gloria Grimes MRN: [id number] DOB: 22-Jan-1931 Today's Date: 03/09/2016   History of Present Illness  Pt is an 82 y/o female presenting with cough, fever, chills, and purulent sputum. Pt admitted for sepsis with Chest x-ray 03/08/16 showing bronchitis. PMH of CHF (resolved/improved with diet/exercise over the past 16 years).  Clinical Impression  Pt lives in a 1 level home with her husband who she has providing extra assist due to his health concerns. Pt independent with all mobility without an AD , all ADL's, and drives herself. Pt initially on room air upon PT arrival sitting EOB with O2 sats 89% at rest. After transition bed to [facility] pt desats to 83% and with min vc's to purse-lip breathes returns to 87% after approximately 1 minute (RN consulted and requesting O2 be placed at 2 L/min  ; O2 returned to 95%). Pt able to transfer and ambulate min guard 240 ft for safety only requiring 1 standing rest break due to coughing spell only. Pt O2 throughout ambulation and the rest of session maintained at 91% or higher on 2 L. Pt tolerated moderate balance challenges while ambulating and step up to targets well without LOB. Pt will benefit from continued PT in order to improve pt endurance and activity tolerance as medically able. Pt is able to to return home safely at this time.     Follow Up Recommendations Home health PT    Equipment Recommendations  None recommended by PT    Recommendations for Other Services       Precautions / Restrictions Precautions Precautions: Fall Restrictions Weight Bearing Restrictions: No      Mobility  Bed Mobility               General bed mobility comments: Unable to assess; pt sitting EOB upon PT arrival and sitting in recliner at end of session  Transfers Overall transfer level: Needs assistance Equipment used: None Transfers: Sit to/from [facility] Sit to Stand: Min guard Stand  pivot transfers: Min guard       General transfer comment: pt is impulsive requiring min vc's to wait prior to standing up; x 4 trials sit to stand with CGA safety only (no LOB); x2 stand pivot bed <> BSC CGA for safety only (no LOB; pt able to complete hygiene independently)  Ambulation/Gait Ambulation/Gait assistance: Min guard Ambulation Distance (Feet): 240 Feet Assistive device: None Gait Pattern/deviations: Step-through pattern Gait velocity: slightly decreased   General Gait Details: Pt lightly resting R UE on IV pole throughout ambulation (not using for support) and min guard for safety only; 1 standing rest break for coughing spell  Stairs            Wheelchair Mobility    Modified Rankin (Stroke Patients Only)       Balance Overall balance assessment: Needs assistance Sitting-balance support: Feet supported Sitting balance-Leahy Scale: Good Sitting balance - Comments: sitting EOB with good posture     Standing balance-Leahy Scale: Good Standing balance comment: Pt min guard for safety only; pt able to tolerate horizontal and vertical head turns with slight decrease in gait speed and speed changes without concern                             Pertinent Vitals/Pain Pain Assessment: No/denies pain    Home Living Family/patient expects to be discharged to:: Private residence Living Arrangements: Spouse/significant other Available Help at Discharge:  Family Type of Home: House Home Access: Stairs to enter Entrance Stairs-Rails: Left Entrance Stairs-Number of Steps: 6 Home Layout: One level Home Equipment: Walker - 2 wheels;Cane - single point;Shower seat;Grab bars - toilet;Grab bars - tub/shower      Prior Function Level of Independence: Independent         Comments: Pt was assisting husband PRN due to his health concerns, pt drives self     Hand Dominance   Dominant Hand: Right    Extremity/Trunk Assessment   Upper Extremity  Assessment: Overall WFL for tasks assessed (at least 3/5 bilaterally)           Lower Extremity Assessment: Overall WFL for tasks assessed (At least 3/5 bilaterally, no knee buckling)         Communication   Communication: No difficulties  Cognition Arousal/Alertness: Awake/alert Behavior During Therapy: WFL for tasks assessed/performed Overall Cognitive Status: Within Functional Limits for tasks assessed                      General Comments General comments (skin integrity, edema, etc.): Pt is agreeable to PT session.     Exercises General Exercises - Lower Extremity Hip Flexion/Marching: AROM;Strengthening;Both;5 reps;Standing (able to complete without UE support and min guard for safety only (no LOB) tapping foot on target)   Assessment/Plan    PT Assessment Patient needs continued PT services  PT Problem List Cardiopulmonary status limiting activity;Decreased activity tolerance          PT Treatment Interventions Gait training;Stair training;Therapeutic activities;Therapeutic exercise;Patient/family education    PT Goals (Current goals can be found in the Care Plan section)  Acute Rehab PT Goals Patient Stated Goal: To go home. PT Goal Formulation: With patient Time For Goal Achievement: 03/23/16 Potential to Achieve Goals: Good    Frequency Min 2X/week   Barriers to discharge        Co-evaluation               End of Session Equipment Utilized During Treatment: Gait belt;Oxygen Activity Tolerance: Patient tolerated treatment well Patient left: in chair;with call bell/phone within reach;with chair alarm set Nurse Communication: Mobility status;Precautions         Time: [id number] PT Time Calculation (min) (ACUTE ONLY): 43 min   Charges:         PT G Codes:        Vinie Charity, SPT 03/09/2016, 1:27 PM

## 2016-03-09 NOTE — Progress Notes (Signed)
[facility] [place] NAME: Gloria Grimes    MR#:  [id number]  DATE OF BIRTH:  Oct 13, 1930  SUBJECTIVE:   Patient feeling better this morning. She still has a persistent cough. She still found to be hypoxic on room air.  REVIEW OF SYSTEMS:    Review of Systems  Constitutional: Negative.  Negative for chills, fever and malaise/fatigue.  HENT: Negative.  Negative for ear discharge, ear pain, hearing loss, nosebleeds and sore throat.   Eyes: Negative.  Negative for blurred vision and pain.  Respiratory: Positive for cough. Negative for hemoptysis, shortness of breath and wheezing.   Cardiovascular: Negative.  Negative for chest pain, palpitations and leg swelling.  Gastrointestinal: Negative.  Negative for abdominal pain, blood in stool, diarrhea, nausea and vomiting.  Genitourinary: Negative.  Negative for dysuria.  Musculoskeletal: Negative.  Negative for back pain.  Skin: Negative.   Neurological: Negative for dizziness, tremors, speech change, focal weakness, seizures and headaches.  Endo/Heme/Allergies: Negative.  Does not bruise/bleed easily.  Psychiatric/Behavioral: Negative.  Negative for depression, hallucinations and suicidal ideas.    Tolerating Diet: yes      DRUG ALLERGIES:   Allergies  Allergen Reactions  . Penicillins   . Shellfish Allergy     VITALS:  Blood pressure (!) 130/52, pulse 90, temperature 97.5 F (36.4 C), temperature source Oral, resp. rate 16, height 5\' 2"  (1.575 m), weight 72.2 kg (159 lb 1.6 oz), SpO2 94 %.  PHYSICAL EXAMINATION:   Physical Exam    LABORATORY PANEL:   CBC  Recent Labs Lab 03/08/16 0831  WBC 11.5*  HGB 12.7  HCT 37.0  PLT 180   ------------------------------------------------------------------------------------------------------------------  Chemistries   Recent Labs Lab 03/09/16 0839  NA 137  K 3.9  CL 98*  CO2 30  GLUCOSE 137*  BUN 11  CREATININE 0.49   CALCIUM 9.3  MG 2.1   ------------------------------------------------------------------------------------------------------------------  Cardiac Enzymes  Recent Labs Lab 03/08/16 0831  TROPONINI <0.03   ------------------------------------------------------------------------------------------------------------------  RADIOLOGY:  Dg Chest 1 View  Result Date: 03/09/2016 CLINICAL DATA:  Acute hypoxic respiratory failure and pneumonia. EXAM: CHEST 1 VIEW COMPARISON:  03/08/2016 chest radiograph FINDINGS: Upper limits normal heart size again identified. Mild peribronchial thickening and lung hyperexpansion again noted. There is no evidence of focal airspace disease, pulmonary edema, suspicious pulmonary nodule/mass, pleural effusion, or pneumothorax. No acute bony abnormalities are identified. IMPRESSION: Upper limits normal heart size with unchanged mild peribronchial thickening and hyperexpansion. Electronically Signed   By: Margarette Canada M.D.   On: 03/09/2016 09:32   Dg Chest 2 View  Result Date: 03/08/2016 CLINICAL DATA:  82 year old female with worsening productive cough since yesterday evening. Difficulty breathing. EXAM: CHEST  2 VIEW COMPARISON:  No priors. FINDINGS: Diffuse peribronchial cuffing. Mild diffuse interstitial prominence, predominantly throughout the mid to lower lungs bilaterally. No confluent consolidative airspace disease. No pleural effusions. Lungs appear mildly hyperexpanded. No evidence of pulmonary edema. Heart size is normal. Upper mediastinal contours are within normal limits. Aortic atherosclerosis. IMPRESSION: 1. The appearance the chest suggests bronchitis, as above. 2. Mild hyperexpansion of the lungs could suggest some associated air trapping. 3. Aortic atherosclerosis. Electronically Signed   By: Vinnie Langton M.D.   On: 03/08/2016 09:22   Dg Tibia/fibula Right  Result Date: 03/08/2016 CLINICAL DATA:  82 year old female with history of open wound  infection overlying the right tibia and fibula. EXAM: RIGHT TIBIA AND FIBULA - 2 VIEW COMPARISON:  No priors. FINDINGS: Right tibia  and fibula are normal in appearance. Specifically, no destructive osseous lesion. Overlying soft tissues are unremarkable. No unexpected radiopaque foreign body in the soft tissues. IMPRESSION: Negative. Electronically Signed   By: Vinnie Langton M.D.   On: 03/08/2016 11:34     ASSESSMENT AND PLAN:   82 year old female who presents with acute hypoxic respiratory failure due to sepsis and community-acquired pneumonia.  1. Acute hypoxic respiratory failure in the setting of community-acquired pneumonia: Patient still requiring oxygen. His chest x-ray still does not show pneumonia however clinically it appears that she has a pneumonia.   2. Community-acquired pneumonia: Continue Rocephin and azithromycin. Follow up on sputum culture.   3. Sepsis: This is due to problem #2.  Sepsis is resolved  4. Hypokalemia: This was repleted Hyponatremia has improved with IV fluids. 5. Shin ulcer:Cleanse wound to right leg with NS.  Gently fill wound with Iodoform gauze.  Zinc layer, secured with nonadherent COban.  Stocking.  Change weekly at [facility] Wound [facility] consult appreciated No evidence of myelitis on x-ray  6. Idiopathic cardiomyopathy now resolved as per Dr. Bethanne Ginger evaluation in 2016 with [facility]. Continue Lasix and patient would benefit from compression stockings     Rounded with nursing Management plans discussed with the patient and she is in agreement.  CODE STATUS: partial  TOTAL TIME TAKING CARE OF THIS PATIENT: 33 minutes.     POSSIBLE D/C tomorrow, DEPENDING ON CLINICAL CONDITION.   [name] M.D on 03/09/2016 at 12:22 PM  Between 7am to 6pm - Pager - [phone] After 6pm go to [email or web address] - password [email or web address] [facility]  Office  [phone]  CC: Primary care physician; [place] Pink, MD  Note: This  dictation was prepared with Dragon dictation along with smaller phrase technology. Any transcriptional errors that result from this process are unintentional.

## 2016-03-09 NOTE — Progress Notes (Signed)
Patient walked 3 laps around nurses station this evening. 2L oxygen at beginning 92%. 2L oxygen after walking 3 laps 88% but recovered quickly with rest.

## 2016-03-09 NOTE — Care Management Important Message (Signed)
Important Message  Patient Details  Name: GARA WAHLS MRN: [id number] Date of Birth: [birth year]   Medicare Important Message Given:  Yes    Shelbie Ammons, RN 03/09/2016, 8:52 AM

## 2016-03-09 NOTE — Progress Notes (Signed)
PHARMACIST - PHYSICIAN COMMUNICATION DR:   Benjie Karvonen CONCERNING: Antibiotic IV to Oral Route Change Policy  RECOMMENDATION: This patient is receiving Azithromycin  by the intravenous route.  Based on criteria approved by the Pharmacy and Therapeutics Committee, the antibiotic(s) is/are being converted to the equivalent oral dose form(s).   DESCRIPTION: These criteria include:  Patient being treated for a respiratory tract infection, urinary tract infection, cellulitis or clostridium difficile associated diarrhea if on metronidazole  The patient is not neutropenic and does not exhibit a GI malabsorption state  The patient is eating (either orally or via tube) and/or has been taking other orally administered medications for a least 24 hours  The patient is improving clinically and has a Tmax < 100.5  If you have questions about this conversion, please contact the Pharmacy Department  []   [phone] )  Gloria Grimes [x]   [phone] )  [facility] []   [phone] )  [facility] []   [phone] )  [facility] []   [phone] )  [facility]

## 2016-03-09 NOTE — Progress Notes (Addendum)
SHIRONDA, CAPONIGRO ([id number]) Visit Report for 02/29/2016 Chief Complaint Document Details Patient Name: Gloria Grimes, Gloria Grimes Date of Service: 02/29/2016 9:15 AM Medical Record Number: [id number] Patient Account Number: [id number] Date of Birth/Sex: 1931-02-17 (82 y.o. Female) Treating RN: Montey Hora Primary Care Physician: Maryland Pink Other Clinician: Referring Physician: [place] Pink Treating Physician/Extender: Melburn Hake, HOYT Weeks in Treatment: 3 Information Obtained from: Patient Chief Complaint Traumatic wound on her right lower leg Electronic Signature(s) Signed: 03/02/2016 1:37:32 AM By: Worthy Keeler PA-C Entered By: Worthy Keeler on 02/29/2016 10:04:58 Gloria Grimes ([id number]) -------------------------------------------------------------------------------- Debridement Details Patient Name: Gloria Grimes Date of Service: 02/29/2016 9:15 AM Medical Record Number: [id number] Patient Account Number: [id number] Date of Birth/Sex: 12-16-1930 (82 y.o. Female) Treating RN: Cornell Barman Primary Care Physician: Maryland Pink Other Clinician: Referring Physician: [place] Pink Treating Physician/Extender: Melburn Hake, HOYT Weeks in Treatment: 3 Debridement Performed for Wound #1 Right,Anterior Lower Leg Assessment: Performed By: Physician STONE III, HOYT, Debridement: Debridement Pre-procedure Yes - 09:48 Verification/Time Out Taken: Start Time: 09:48 Pain Control: Lidocaine 4% Topical Solution Level: Skin/Subcutaneous Tissue Total Area Debrided (L x 0.5 (cm) x 0.6 (cm) = 0.3 (cm) W): Tissue and other Viable, Non-Viable, Exudate, Fibrin/Slough, Subcutaneous material debrided: Instrument: Curette Bleeding: Minimum Hemostasis Achieved: Pressure End Time: 09:50 Procedural Pain: 0 Post Procedural Pain: 0 Response to Treatment: Procedure was tolerated well Post Debridement Measurements of Total Wound Length: (cm) 0.5 Width: (cm) 0.6 Depth: (cm) 0.25 Volume:  (cm) 0.059 Character of Wound/Ulcer Post Improved Debridement: Severity of Tissue Post Debridement: Fat layer exposed Post Procedure Diagnosis Same as Pre-procedure Electronic Signature(s) Signed: 03/07/2016 1:46:02 PM By: Gretta Cool, RN, BSN, Kim RN, BSN Signed: 03/08/2016 4:52:13 PM By: Worthy Keeler PA-C Previous Signature: 02/29/2016 5:47:08 PM Version By: Montey Hora Previous Signature: 03/02/2016 1:37:32 AM Version By: Maudie Mercury ([id number]) Entered By: Gretta Cool RN, BSN, Kim on 03/07/2016 13:24:26 REMILYNN, RICKELS ([id number]) -------------------------------------------------------------------------------- HPI Details Patient Name: Gloria Grimes Date of Service: 02/29/2016 9:15 AM Medical Record Number: [id number] Patient Account Number: [id number] Date of Birth/Sex: 21-Jul-1930 (82 y.o. 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 She lives at home with her husband who is had recent health problems himself. In any case she was making a bed in her home 2 weeks ago and managed to traumatized her right lower leg on the handle of her reclining chair. Per the patient's description this was a punched-out wound over they made some attempt to suture this together with 7 sutures. She was given a course of oral antibiotics. Her primary physician remove the sutures 7 days later however the wound had already dehisced. Since then she's been using antibiotic ointment and nonadherent pad and some form of wrap applied at an urgent care. She has no prior wound history. She is receiving to rounds of oral antibiotics finishing today. She is not a diabetic  and is a long standing ex-smoker. Her only other primary medical diagnosis is congestive heart failure. The ABI's in this clinic was 0.7 on the right 0.8 on the left 02/09/16; she arrives today with not much change in the wound surface. Still an adherent slough although this is better than last week. Clearly the decision to use Prisma on this  was not the right decision, we'll change to Santyl this week. 02/16/16; the wound is smaller still with some depth with adherent slough. Using Santyl 02/22/16; the wound seems to be smaller less adherent slough it. She has been using Santyl for 2 weeks 02/29/16 patient presents today for a follow-up visit concerning her ongoing right anterior shin traumatic wound. She tells me this point time that she is having only minimal discomfort 1 out of 10 described as a burning sensation occasionally. She has been tolerating the Kerlix and Coban wraps at this point in time very well. Her biggest question is whether or not there is anything she can do to help this in particular I explained that the best thing she can do is keep her wrap dry and otherwise follow the recommendations that been made up to this point. She does have bilateral lower extremity edema which is controlled with wrapping at this point in time on the right. She questions if compression stockings on the left also be of benefit. Electronic Signature(s) Signed: 03/02/2016 1:37:32 AM By: Worthy Keeler PA-C Entered By: Worthy Keeler on 02/29/2016 10:06:23 Gloria Grimes ([id number]) -------------------------------------------------------------------------------- Physical Exam Details Patient Name: Gloria Grimes Date of Service: 02/29/2016 9:15 AM Medical Record Number: [id number] Patient Account Number: [id number] Date of Birth/Sex: [birth year] (82 y.o. Female) Treating RN: Montey Hora Primary Care Physician: Maryland Pink Other Clinician: Referring Physician: Maryland Pink Treating  Physician/Extender: Melburn Hake, HOYT Weeks in Treatment: 3 Constitutional Well-nourished and well-hydrated in no acute distress. Respiratory normal breathing without difficulty. clear to auscultation bilaterally. Cardiovascular regular rate and rhythm with normal S1, S2. bilateral lower extremity 1+ pitting edema noted. Patient has edema on the left as the right is currently being wrap with Kerlix and Coban... Psychiatric this patient is able to make decisions and demonstrates good insight into disease process. Alert and Oriented x 3. pleasant and cooperative. Electronic Signature(s) Signed: 03/02/2016 1:37:32 AM By: Worthy Keeler PA-C Entered By: Worthy Keeler on 02/29/2016 10:07:13 Gloria Grimes ([id number]) -------------------------------------------------------------------------------- Physician Orders Details Patient Name: Gloria Grimes Date of Service: 02/29/2016 9:15 AM Medical Record Number: [id number] Patient Account Number: [id number] Date of Birth/Sex: 08-13-1930 (82 y.o. Female) Treating RN: Montey Hora Primary Care Physician: Maryland Pink Other Clinician: Referring Physician: Maryland Pink Treating Physician/Extender: Melburn Hake, HOYT Weeks in Treatment: 3 Verbal / Phone Orders: Yes Clinician: Montey Hora Read Back and Verified: Yes Diagnosis Coding Wound Cleansing Wound #1 Right,Anterior Lower Leg o Cleanse wound with mild soap and water o May shower with protection. o No tub bath. Anesthetic Wound #1 Right,Anterior Lower Leg o Topical Lidocaine 4% cream applied to wound bed prior to debridement Skin Barriers/Peri-Wound Care Wound #1 Right,Anterior Lower Leg o Barrier cream o Moisturizing lotion o Other: - TCA Primary Wound Dressing Wound #1 Right,Anterior Lower Leg o Prisma Ag Secondary Dressing Wound #1 Right,Anterior Lower Leg o Dry Gauze Dressing Change Frequency Wound #1 Right,Anterior Lower Leg o Change dressing  every week Follow-up Appointments Wound #1 Right,Anterior Lower Leg o Return Appointment in 1 week. Edema Control Wound #1 Right,Anterior Lower Leg o Elevate legs to the level of the heart and pump ankles as often as possible NATALIE, CUADROS. ([id number]) o Other: - Kerlix and Coban unna to anchor Additional Orders / Instructions Wound #1 Right,Anterior Lower Leg o Increase protein intake. Electronic Signature(s) Signed: 03/07/2016 1:46:02 PM By: Gretta Cool RN, BSN, Kim RN, BSN Signed: 03/08/2016 4:52:13 PM By: Worthy Keeler PA-C Previous Signature: 02/29/2016  5:47:08 PM Version By: Montey Hora Entered By: Gretta Cool RN, BSN, Kim on 03/07/2016 13:24:43 ZYKIRIA, GUTTILLA ([id number]) -------------------------------------------------------------------------------- Problem List Details Patient Name: Gloria Grimes Date of Service: 02/29/2016 9:15 AM Medical Record Number: [id number] Patient Account Number: [id number] Date of Birth/Sex: 11-30-1930 (82 y.o. Female) Treating RN: Montey Hora Primary Care Physician: Maryland Pink Other Clinician: Referring Physician: Maryland Pink Treating Physician/Extender: Melburn Hake, HOYT Weeks in Treatment: 3 Active Problems ICD-10 Encounter Code Description Active Date Diagnosis S81.811S Laceration without foreign body, right lower leg, sequela 02/02/2016 Yes I87.301 Chronic venous hypertension (idiopathic) without 0000000 Yes complications of right lower extremity Inactive Problems Resolved Problems Electronic Signature(s) Signed: 03/02/2016 1:37:32 AM By: Worthy Keeler PA-C Entered By: Worthy Keeler on 02/29/2016 10:04:08 Gloria Grimes ([id number]) -------------------------------------------------------------------------------- Progress Note Details Patient Name: Gloria Grimes Date of Service: 02/29/2016 9:15 AM Medical Record Number: [id number] Patient Account Number: [id number] Date of Birth/Sex: May 18, 1931 (82 y.o.  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 She lives at home with her husband who is had recent health problems himself. In any case she was making a bed in her home 2 weeks ago and managed to traumatized her right lower leg on the handle of her reclining chair. Per the patient's description this was a punched-out wound over they made some attempt to suture this together with 7 sutures. She was given a course of oral antibiotics. Her primary physician remove the sutures 7 days later however the wound had already dehisced. Since then she's been using antibiotic ointment and nonadherent pad and some form of wrap applied at an urgent care. She has no prior wound history. She is receiving to rounds of oral antibiotics finishing today. She is not a diabetic and is a long standing ex-smoker. Her only other primary medical diagnosis is congestive heart failure. The ABI's in this clinic was 0.7 on the right 0.8 on the left 02/09/16; she arrives today with not much change in the wound surface. Still an adherent slough although this is better than last week. Clearly the decision to use Prisma on this was not the right decision, we'll change to Santyl this week. 02/16/16; the wound is smaller still with some depth with adherent slough. Using Santyl 02/22/16; the wound seems to be smaller less adherent slough it. She has been using Santyl for 2 weeks 02/29/16 patient presents today for a follow-up visit concerning her ongoing right anterior shin traumatic wound. She tells me this point time that she  is having only minimal discomfort 1 out of 10 described as a burning sensation occasionally. She has been tolerating the Kerlix and Coban wraps at this point in time very well. Her biggest question is whether or not there is anything she can do to help this in particular I explained that the best thing she can do is keep her wrap dry and otherwise follow the recommendations that been made up to this point. She does have bilateral lower extremity edema which is controlled with wrapping at this point in time on the right. She questions if compression stockings on the left also be of benefit. Objective Constitutional CAYLAN, PATINO ([id number]) Well-nourished and well-hydrated in  no acute distress. Vitals Time Taken: 9:21 AM, Height: 62 in, Weight: 156.9 lbs, BMI: 28.7, Temperature: 98.1 F, Pulse: 86 bpm, Respiratory Rate: 18 breaths/min, Blood Pressure: 156/68 mmHg. Respiratory normal breathing without difficulty. clear to auscultation bilaterally. Cardiovascular regular rate and rhythm with normal S1, S2. bilateral lower extremity 1+ pitting edema noted. Patient has edema on the left as the right is currently being wrap with Kerlix and Coban... Psychiatric this patient is able to make decisions and demonstrates good insight into disease process. Alert and Oriented x 3. pleasant and cooperative. Integumentary (Hair, Skin) Wound #1 status is Open. Original cause of wound was Trauma. The wound is located on the Right,Anterior Lower Leg. The wound measures 0.5cm length x 0.6cm width x 0.2cm depth; 0.236cm^2 area and 0.047cm^3 volume. The wound is limited to skin breakdown. There is no tunneling or undermining noted. There is a large amount of serosanguineous drainage noted. The wound margin is distinct with the outline attached to the wound base. There is large (67-100%) red granulation within the wound bed. There is a small (1-33%) amount of necrotic tissue within the wound bed including  Eschar and Adherent Slough. The periwound skin appearance exhibited: Localized Edema, Maceration, Moist, Erythema. The surrounding wound skin color is noted with erythema which is circumferential. Periwound temperature was noted as No Abnormality. The periwound has tenderness on palpation. Assessment Active Problems ICD-10 S81.811S - Laceration without foreign body, right lower leg, sequela I87.301 - Chronic venous hypertension (idiopathic) without complications of right lower extremity Diagnoses ICD-10 S81.811S: Laceration without foreign body, right lower leg, sequela I87.301: Chronic venous hypertension (idiopathic) without complications of right lower extremity ALEERA, BINKOWSKI. ([id number]) Procedures Wound #1 Wound #1 is a Trauma, Other located on the Right,Anterior Lower Leg . There was a Skin/Subcutaneous Tissue Debridement [id number]) debridement with total area of 0.3 sq cm performed by STONE III, HOYT. with the following instrument(s): Curette to remove Viable and Non-Viable tissue/material including Exudate, Fibrin/Slough, and Subcutaneous after achieving pain control using Lidocaine 4% Topical Solution. A time out was conducted at 09:48, prior to the start of the procedure. A Minimum amount of bleeding was controlled with Pressure. The procedure was tolerated well with a pain level of 0 throughout and a pain level of 0 following the procedure. Post Debridement Measurements: 0.5cm length x 0.6cm width x 0.25cm depth; 0.059cm^3 volume. Character of Wound/Ulcer Post Debridement is improved. Severity of Tissue Post Debridement is: Fat layer exposed. Post procedure Diagnosis Wound #1: Same as Pre-Procedure By the way of sharp debridement using a 3 mm curet I was able to debride the wound on the right anterior shin of exudate as well as slough. This was down to subcutaneous level where there was good bleeding noted in the wound looks significantly cleaner following debridement  compared to prior. Patient tolerated procedure well this point in time. Plan Wound Cleansing: Wound #1 Right,Anterior Lower Leg: Cleanse wound with mild soap and water May shower with protection. No tub bath. Anesthetic: Wound #1 Right,Anterior Lower Leg: Topical Lidocaine 4% cream applied to wound bed prior to debridement Skin Barriers/Peri-Wound Care: Wound #1 Right,Anterior Lower Leg: Barrier cream Moisturizing lotion Other: - TCA Primary Wound Dressing: Wound #1 Right,Anterior Lower Leg: Prisma Ag Secondary Dressing: Wound #1 Right,Anterior Lower Leg: Dry Gauze Dressing Change Frequency: Wound #1 Right,Anterior Lower Leg: EVONY, STOUTAMIRE. ([id number]) Change dressing every week Follow-up Appointments: Wound #1 Right,Anterior Lower Leg: Return Appointment in 1 week. Edema Control: Wound #1 Right,Anterior Lower Leg: Elevate legs to  the level of the heart and pump ankles as often as possible Other: - Kerlix and Coban unna to anchor Additional Orders / Instructions: Wound #1 Right,Anterior Lower Leg: Increase protein intake. Follow-Up Appointments: A follow-up appointment should be scheduled. A Patient Clinical Summary of Care was provided to JF At this point in time again recommend that we continue with the Prisma AG. We will also continue with the Kerlex and Coban and wraps which I think are appropriate considering her ABI on the right. we will see her back for a follow-up visit in one week. All questions and concerns were answered to the best of my ability on evaluation today. Electronic Signature(s) Signed: 03/22/2016 4:44:49 PM By: Worthy Keeler PA-C Previous Signature: 03/02/2016 1:37:32 AM Version By: Worthy Keeler PA-C Entered By: Worthy Keeler on 03/22/2016 16:44:49 CANTRELL, MCGINNISS ([id number]) -------------------------------------------------------------------------------- SuperBill Details Patient Name: Gloria Grimes Date of Service:  02/29/2016 Medical Record Number: [id number] Patient Account Number: [id number] Date of Birth/Sex: 26-Apr-1931 (82 y.o. Female) Treating RN: Montey Hora Primary Care Physician: Maryland Pink Other Clinician: Referring Physician: Maryland Pink Treating Physician/Extender: Melburn Hake, HOYT Weeks in Treatment: 3 Diagnosis Coding ICD-10 Codes Code Description [phone] Laceration without foreign body, right lower leg, sequela I87.301 Chronic venous hypertension (idiopathic) without complications of right lower extremity Facility Procedures CPT4: Description Modifier Quantity Code [id number] - DEB SUBQ TISSUE 20 SQ CM/< 1 ICD-10 Description Diagnosis S81.811S Laceration without foreign body, right lower leg, sequela I87.301 Chronic venous hypertension (idiopathic) without complications  of right lower extremity Physician Procedures CPT4: Description Modifier Quantity Code [id number] - WC PHYS SUBQ TISS 20 SQ CM 1 ICD-10 Description Diagnosis S81.811S Laceration without foreign body, right lower leg, sequela I87.301 Chronic venous hypertension (idiopathic) without complications  of right lower extremity Electronic Signature(s) Signed: 03/02/2016 1:37:32 AM By: Worthy Keeler PA-C Entered By: Worthy Keeler on 02/29/2016 10:10:13

## 2016-03-09 NOTE — Progress Notes (Signed)
While rounding, [place] made initial visit to room 126. Pt was in good spirits but seemed a bit preoccupied. She indicated that she was awaiting another procedure. I asked if this was a good time for a visit? She appreciated my time, but felt it would be better to see her later.

## 2016-03-09 NOTE — Plan of Care (Signed)
Problem: Physical Regulation: Goal: Ability to maintain clinical measurements within normal limits will improve Outcome: Progressing Chair more activity  Problem: Bowel/Gastric: Goal: Will not experience complications related to bowel motility Outcome: Progressing Pt given lasix iv today and pt diuresing well.  Pt was weaned to 2 l [place] 02  Unable to  Wean completelt off due to sats dropped  Too low 83.  Dr mody aware.   No c/o pain

## 2016-03-10 ENCOUNTER — Inpatient Hospital Stay: Payer: Medicare Other

## 2016-03-10 ENCOUNTER — Encounter: Payer: Self-pay | Admitting: Radiology

## 2016-03-10 LAB — BASIC METABOLIC PANEL
Anion gap: 6 (ref 5–15)
BUN: 18 mg/dL (ref 6–20)
CHLORIDE: 99 mmol/L — AB (ref 101–111)
CO2: 35 mmol/L — ABNORMAL HIGH (ref 22–32)
CREATININE: 0.55 mg/dL (ref 0.44–1.00)
Calcium: 9.1 mg/dL (ref 8.9–10.3)
GFR calc Af Amer: >60 mL/min (ref >60)
GFR calc non Af Amer: >60 mL/min (ref >60)
GLUCOSE: 135 mg/dL — AB (ref 65–99)
POTASSIUM: 3.6 mmol/L (ref 3.5–5.1)
SODIUM: 140 mmol/L (ref 135–145)

## 2016-03-10 MED ORDER — IOPAMIDOL (ISOVUE-370) INJECTION 76%
75.0000 mL | Freq: Once | INTRAVENOUS | Status: AC | PRN
  Administered 2016-03-10: 75 mL via INTRAVENOUS

## 2016-03-10 MED ORDER — DEXTROMETHORPHAN POLISTIREX ER 30 MG/5ML PO SUER
30.0000 mg | Freq: Two times a day (BID) | ORAL | Status: DC
  Administered 2016-03-10: 30 mg via ORAL
  Filled 2016-03-10 (×3): qty 5

## 2016-03-10 MED ORDER — LEVOFLOXACIN 750 MG PO TABS: 750.0000 mg | ORAL_TABLET | Freq: Every day | ORAL | 0 refills | Status: DC

## 2016-03-10 MED ORDER — DM-GUAIFENESIN ER 30-600 MG PO TB12: 1.0000 | ORAL_TABLET | Freq: Two times a day (BID) | ORAL | 0 refills | Status: DC

## 2016-03-10 MED ORDER — GUAIFENESIN-DM 100-10 MG/5ML PO SYRP
5.0000 mL | ORAL_SOLUTION | ORAL | Status: DC | PRN
  Administered 2016-03-10: 5 mL via ORAL
  Filled 2016-03-10: qty 5

## 2016-03-10 MED ORDER — GUAIFENESIN ER 600 MG PO TB12: 600.0000 mg | ORAL_TABLET | Freq: Two times a day (BID) | ORAL | 0 refills | Status: DC

## 2016-03-10 MED ORDER — DM-GUAIFENESIN ER 30-600 MG PO TB12: 1.0000 | ORAL_TABLET | Freq: Two times a day (BID) | ORAL | Status: DC

## 2016-03-10 MED ORDER — PREDNISONE 50 MG PO TABS
50.0000 mg | ORAL_TABLET | Freq: Every day | ORAL | Status: DC
  Administered 2016-03-10: 50 mg via ORAL
  Filled 2016-03-10: qty 1

## 2016-03-10 MED ORDER — AZITHROMYCIN 500 MG PO TABS: 500.0000 mg | ORAL_TABLET | Freq: Every day | ORAL | 0 refills | Status: DC

## 2016-03-10 MED ORDER — GUAIFENESIN ER 600 MG PO TB12
600.0000 mg | ORAL_TABLET | Freq: Two times a day (BID) | ORAL | Status: DC
  Administered 2016-03-10: 09:00:00 600 mg via ORAL
  Filled 2016-03-10: qty 1

## 2016-03-10 MED ORDER — BENZONATATE 100 MG PO CAPS: 100.0000 mg | ORAL_CAPSULE | Freq: Two times a day (BID) | ORAL | 0 refills | Status: DC

## 2016-03-10 MED ORDER — PREDNISONE 50 MG PO TABS: ORAL_TABLET | ORAL | 0 refills | Status: DC

## 2016-03-10 NOTE — Care Management (Signed)
Admitted to [facility] with the diagnosis of sepsis/pneumonia. Lives with husband, Rhulon [phone]). Last seen Dr. Kary Kos Monday October 9th. No home health. No skilled facility. No home oxygen. Takes care of all basic and instrumental activities of daily living herself, drives. No falls. Usually a good appetite. Prescriptions are filled at [facility]. Will be moving to [facility] [place] in 2.5 weeks. Physical therapy evaluation completed. Recommends home with home health/physical therapy. Also, may need home oxygen. Discussed agencies. Would like [facility] for all services. Edward Qualia, RN representative for [facility] updated. Discharge to home today per Dr. Benjie Karvonen. Family will transport.  Shelbie Ammons RN MSN CCM Care Management [phone]

## 2016-03-10 NOTE — Progress Notes (Addendum)
SATURATION QUALIFICATIONS: (This note is used to comply with regulatory documentation for home oxygen)  Patient Saturations on Room Air at Rest = 79%  Patient Saturations on Room Air while Ambulating = 73%  Patient Saturations on 2 Liters of oxygen while Ambulating = 97%  Please briefly explain why patient needs home oxygen: Pnemonia

## 2016-03-10 NOTE — Discharge Summary (Signed)
[facility] NAME: Gloria Grimes    MR#:  [id number]  DATE OF BIRTH:  1931-04-26  DATE OF ADMISSION:  03/08/2016 ADMITTING PHYSICIAN: Bettey Costa, MD  DATE OF DISCHARGE: 03/10/2016  PRIMARY CARE PHYSICIAN: [place] Pink, MD    ADMISSION DIAGNOSIS:  Hypokalemia [E87.6] Hyponatremia [E87.1] Infection [B99.9] Hypoxia [R09.02] Community acquired pneumonia, unspecified laterality [J18.9]  DISCHARGE DIAGNOSIS:  Active Problems:   Sepsis ([place])   SECONDARY DIAGNOSIS:   Past Medical History:  Diagnosis Date  . Cancer ([place])    ovarian  . CHF (congestive heart failure) [facility])     HOSPITAL COURSE:    82 year old female who presents with acute hypoxic respiratory failure due to sepsis and community-acquired pneumonia.  1. Acute hypoxic respiratory failure in the setting of community-acquired pneumonia:Patient was admitted for community acquired pneumonia. She was started on Rocephin and azithromycin. Although chest x-ray did not allude to pneumonia she underwent CT scan of the chest due to persistent hypoxia. Chest x-ray does show bilateral pneumonia. She will continue on antibiotics for 5 more days. She will need oxygen at discharge. It should be noted that CT also alludes to possible emphysema. Patient does have a former history of smoking. She was also discharged with a prednisone for 5 days.   2. Community-acquired pneumonia: She was on Rocephin and azithromycin and will be discharged on oral Levaquin. Sputum culture shows rare gram-positive cocci  3. Sepsis: This is due to problem #2.  Sepsis has resolved  4. Hypokalemia: This was repleted Hyponatremia has improved with IV fluids. 5. Shin ulcer: As per wound care clinician Cleanse wound to right leg with NS. Gently fill wound with Iodoform gauze. Zinc layer, secured with nonadherent COban. Stocking. Change weekly at [facility] Wound care consult appreciated. No evidence of  ostial myelitis on x-ray  6. Idiopathic cardiomyopathy now resolved as per Dr. Bethanne Ginger evaluation in 2016Continue Lasix and patient would benefit from compression stockings  DISCHARGE CONDITIONS AND DIET:   Stable for discharge on a good diet/low sodium  CONSULTS OBTAINED:    DRUG ALLERGIES:   Allergies  Allergen Reactions  . Penicillins   . Shellfish Allergy     DISCHARGE MEDICATIONS:   Current Discharge Medication List    START taking these medications   Details  benzonatate (TESSALON) 100 MG capsule Take 1 capsule (100 mg total) by mouth 2 (two) times daily. Qty: 20 capsule, Refills: 0    dextromethorphan-guaiFENesin (MUCINEX DM) 30-600 MG 12hr tablet Take 1 tablet by mouth 2 (two) times daily. Qty: 20 tablet, Refills: 0    guaiFENesin (MUCINEX) 600 MG 12 hr tablet Take 1 tablet (600 mg total) by mouth 2 (two) times daily. Qty: 30 tablet, Refills: 0    levofloxacin (LEVAQUIN) 750 MG tablet Take 1 tablet (750 mg total) by mouth daily. Qty: 5 tablet, Refills: 0    predniSONE (DELTASONE) 50 MG tablet Take 1 tablet for 5 days then stop Qty: 5 tablet, Refills: 0      CONTINUE these medications which have NOT CHANGED   Details  ascorbic acid (VITAMIN C) 500 MG tablet Take 500 mg by mouth daily.    carvedilol (COREG) 6.25 MG tablet Take 6.25 mg by mouth 2 (two) times daily with a meal.    estrogens, conjugated, (PREMARIN) 0.625 MG tablet Take 0.625 mg by mouth daily.    furosemide (LASIX) 40 MG tablet Take 40 mg by mouth daily.  Today   CHIEF COMPLAINT:  Patient doing well this morning. Her symptoms have improved since admission. She is still requiring oxygen. She continues to have a persisting cough. She denies fever or chills. She denies wheezing.   VITAL SIGNS:  Blood pressure (!) 114/54, pulse 99, temperature 98 F (36.7 C), temperature source Oral, resp. rate 17, height 5\' 2"  (1.575 m), weight 72.2 kg (159 lb 1.6 oz), SpO2 97  %.   REVIEW OF SYSTEMS:  Review of Systems  Constitutional: Negative.  Negative for chills, fever and malaise/fatigue.  HENT: Negative.  Negative for ear discharge, ear pain, hearing loss, nosebleeds and sore throat.   Eyes: Negative.  Negative for blurred vision and pain.  Respiratory: Positive for cough. Negative for hemoptysis, shortness of breath and wheezing.   Cardiovascular: Negative.  Negative for chest pain, palpitations and leg swelling.  Gastrointestinal: Negative.  Negative for abdominal pain, blood in stool, diarrhea, nausea and vomiting.  Genitourinary: Negative.  Negative for dysuria.  Musculoskeletal: Negative.  Negative for back pain.  Skin: Negative.   Neurological: Negative for dizziness, tremors, speech change, focal weakness, seizures and headaches.  Endo/Heme/Allergies: Negative.  Does not bruise/bleed easily.  Psychiatric/Behavioral: Negative.  Negative for depression, hallucinations and suicidal ideas.     PHYSICAL EXAMINATION:  GENERAL:  82 y.o.-year-old patient lying in the bed with no acute distress.  NECK:  Supple, no jugular venous distention. No thyroid enlargement, no tenderness.  LUNGS: Normal breath sounds bilaterally, Faint bilateral wheezing, no rales,rhonchi  No use of accessory muscles of respiration.  CARDIOVASCULAR: S1, S2 normal. No murmurs, rubs, or gallops.  ABDOMEN: Soft, non-tender, non-distended. Bowel sounds present. No organomegaly or mass.  EXTREMITIES: No pedal edema, cyanosis, or clubbing.  PSYCHIATRIC: The patient is alert and oriented x 3.  SKIN: No obvious rash, lesion, or ulcer.   DATA REVIEW:   CBC  Recent Labs Lab 03/08/16 0831  WBC 11.5*  HGB 12.7  HCT 37.0  PLT 180    Chemistries   Recent Labs Lab 03/09/16 0839 03/10/16 0320  NA 137 140  K 3.9 3.6  CL 98* 99*  CO2 30 35*  GLUCOSE 137* 135*  BUN 11 18  CREATININE 0.49 0.55  CALCIUM 9.3 9.1  MG 2.1  --     Cardiac Enzymes  Recent Labs Lab  03/08/16 0831  TROPONINI <0.03    Microbiology Results  @[email or web address] @  RADIOLOGY:  Dg Chest 1 View  Result Date: 03/09/2016 CLINICAL DATA:  Acute hypoxic respiratory failure and pneumonia. EXAM: CHEST 1 VIEW COMPARISON:  03/08/2016 chest radiograph FINDINGS: Upper limits normal heart size again identified. Mild peribronchial thickening and lung hyperexpansion again noted. There is no evidence of focal airspace disease, pulmonary edema, suspicious pulmonary nodule/mass, pleural effusion, or pneumothorax. No acute bony abnormalities are identified. IMPRESSION: Upper limits normal heart size with unchanged mild peribronchial thickening and hyperexpansion. Electronically Signed   By: Margarette Canada M.D.   On: 03/09/2016 09:32   Dg Tibia/fibula Right  Result Date: 03/08/2016 CLINICAL DATA:  82 year old female with history of open wound infection overlying the right tibia and fibula. EXAM: RIGHT TIBIA AND FIBULA - 2 VIEW COMPARISON:  No priors. FINDINGS: Right tibia and fibula are normal in appearance. Specifically, no destructive osseous lesion. Overlying soft tissues are unremarkable. No unexpected radiopaque foreign body in the soft tissues. IMPRESSION: Negative. Electronically Signed   By: Vinnie Langton M.D.   On: 03/08/2016 11:34   Ct Angio Chest Pe W Or Wo Contrast  Result  Date: 03/10/2016 CLINICAL DATA:  Hypoxia. Shortness of breath and cough that started 2 weeks ago. EXAM: CT ANGIOGRAPHY CHEST WITH CONTRAST TECHNIQUE: Multidetector CT imaging of the chest was performed using the standard protocol during bolus administration of intravenous contrast. Multiplanar CT image reconstructions and MIPs were obtained to evaluate the vascular anatomy. CONTRAST:  75 cc Isovue 370 intravenous COMPARISON:  None. FINDINGS: Cardiovascular: Satisfactory opacification of the pulmonary arteries to at least the segmental level. No evidence of pulmonary embolism. Normal heart size. No pericardial effusion.  Atherosclerotic calcification of the aorta. Mediastinum/Nodes: No adenopathy or mass.  Negative esophagus. Lungs/Pleura: Scattered mucoid impaction in airways, which are diffusely thickened. Left lateral basilar segment airway obstruction with postobstructive atelectasis/pneumonia. Occasional clusters of ill-defined nodularity in the right upper lobe and lingula. Trace left pleural effusion. Upper Abdomen: Negative Musculoskeletal: No acute or aggressive finding. Other: 12 mm mass the 6 o'clock left breast has the same size and location is a benign cyst on breast ultrasound 10/24/2013. Review of the MIP images confirms the above findings. IMPRESSION: 1. Bronchitis with multi focal mucoid impaction causing segmental left lower lobe atelectasis. Patchy bilateral bronchopneumonia. 2. Negative for pulmonary embolism. 3. Emphysema. Electronically Signed   By: Monte Fantasia M.D.   On: 03/10/2016 10:18      Management plans discussed with the patient and she is in agreement. Stable for discharge home with hhhc Rounded with nursing  Patient should follow up with pcp  CODE STATUS:     Code Status Orders        Start     Ordered   03/08/16 1414  Limited resuscitation (code)  Continuous    Question Answer Comment  In the event of cardiac or respiratory ARREST: Initiate Code Blue, Call Rapid Response Yes   In the event of cardiac or respiratory ARREST: Perform CPR Yes   In the event of cardiac or respiratory ARREST: Perform Intubation/Mechanical Ventilation No   In the event of cardiac or respiratory ARREST: Use NIPPV/BiPAp only if indicated Yes   In the event of cardiac or respiratory ARREST: Administer ACLS medications if indicated Yes   In the event of cardiac or respiratory ARREST: Perform Defibrillation or Cardioversion if indicated Yes      03/08/16 1413    Code Status History    Date Active Date Inactive Code Status Order ID Comments User Context   03/08/2016 12:09 PM 03/08/2016  2:13 PM  Full Code [id number]  Bettey Costa, MD Inpatient      TOTAL TIME TAKING CARE OF THIS PATIENT: 40 minutes.    Note: This dictation was prepared with Dragon dictation along with smaller phrase technology. Any transcriptional errors that result from this process are unintentional.  Ja Pistole M.D on 03/10/2016 at 10:39 AM  Between 7am to 6pm - Pager - [phone] After 6pm go to [email or web address] - password [email or web address] [facility]  Office  [phone]  CC: Primary care physician; [place] Pink, MD

## 2016-03-10 NOTE — Progress Notes (Signed)
Discussed discharge instructions and medications with pt and her daughter. IV removed. All questions addressed. Pt transported home via car by her daughter.

## 2016-03-11 LAB — CULTURE, RESPIRATORY W GRAM STAIN: Culture: NORMAL

## 2016-03-11 LAB — CULTURE, RESPIRATORY

## 2016-03-13 LAB — CULTURE, BLOOD (ROUTINE X 2)
CULTURE: NO GROWTH
Culture: NO GROWTH

## 2016-03-14 ENCOUNTER — Encounter: Payer: Medicare Other | Admitting: Internal Medicine

## 2016-03-14 DIAGNOSIS — I509 Heart failure, unspecified: Secondary | ICD-10-CM | POA: Diagnosis not present

## 2016-03-14 DIAGNOSIS — S81811S Laceration without foreign body, right lower leg, sequela: Secondary | ICD-10-CM | POA: Diagnosis not present

## 2016-03-14 DIAGNOSIS — Z880 Allergy status to penicillin: Secondary | ICD-10-CM | POA: Diagnosis not present

## 2016-03-14 DIAGNOSIS — Z87891 Personal history of nicotine dependence: Secondary | ICD-10-CM | POA: Diagnosis not present

## 2016-03-14 DIAGNOSIS — I87301 Chronic venous hypertension (idiopathic) without complications of right lower extremity: Secondary | ICD-10-CM | POA: Diagnosis present

## 2016-03-14 DIAGNOSIS — X58XXXS Exposure to other specified factors, sequela: Secondary | ICD-10-CM | POA: Diagnosis not present

## 2016-03-14 DIAGNOSIS — I110 Hypertensive heart disease with heart failure: Secondary | ICD-10-CM | POA: Diagnosis not present

## 2016-03-14 DIAGNOSIS — I429 Cardiomyopathy, unspecified: Secondary | ICD-10-CM | POA: Diagnosis not present

## 2016-03-15 NOTE — Progress Notes (Signed)
Gloria Grimes, Gloria Grimes ([id number]) Visit Report for 03/14/2016 Chief Complaint Document Details Patient Name: Gloria Grimes, Gloria Grimes Date of Service: 03/14/2016 12:45 PM Medical Record Patient Account Number: [id number] Number: Treating RN: Cornell Barman [birth year] [id number] y.o. Other Clinician: Date of Birth/Sex: Female) Treating Kasee Hantz Primary Care Physician/Extender: Curly Rim Physician: Referring Physician: Payton Doughty in Treatment: 5 Information Obtained from: Patient Chief Complaint Traumatic wound on her right lower leg Electronic Signature(s) Signed: 03/15/2016 8:01:59 AM By: Linton Ham MD Entered By: Linton Ham on 03/14/2016 14:00:56 Gloria Grimes ([id number]) -------------------------------------------------------------------------------- Debridement Details Patient Name: Gloria Grimes Date of Service: 03/14/2016 12:45 PM Medical Record Patient Account Number: [id number] Number: Treating RN: Cornell Barman [birth year] (82 y.o. Other Clinician: Date of Birth/Sex: Female) Treating Ross Bender Primary Care Physician/Extender: Curly Rim Physician: Referring Physician: Payton Doughty in Treatment: 5 Debridement Performed for Wound #1 Right,Anterior Lower Leg Assessment: Performed By: Physician Ricard Dillon, MD Debridement: Debridement Pre-procedure Yes - 13:12 Verification/Time Out Taken: Start Time: 13:13 Pain Control: Other : lidocaine 4% Level: Skin/Subcutaneous Tissue Total Area Debrided (L x 0.3 (cm) x 0.4 (cm) = 0.12 (cm) W): Tissue and other Viable, Non-Viable, Eschar, Fibrin/Slough, Subcutaneous material debrided: Instrument: Curette Bleeding: Minimum Hemostasis Achieved: Pressure End Time: 13:16 Procedural Pain: 0 Post Procedural Pain: 0 Response to Treatment: Procedure was tolerated well Post Debridement Measurements of Total Wound Length: (cm) 0.3 Width: (cm) 0.4 Depth: (cm)  0.2 Volume: (cm) 0.019 Character of Wound/Ulcer Post Requires Further Debridement Debridement: Severity of Tissue Post Debridement: Fat layer exposed Post Procedure Diagnosis Same as Pre-procedure Electronic Signature(s) Signed: 03/14/2016 3:51:22 PM By: Gretta Cool, RN, BSN, Kim RN, BSN [place], [place] ([id number]) Signed: 03/15/2016 8:01:59 AM By: Linton Ham MD Entered By: Linton Ham on 03/14/2016 14:00:37 Gloria Grimes ([id number]) -------------------------------------------------------------------------------- HPI Details Patient Name: Gloria Grimes Date of Service: 03/14/2016 12:45 PM Medical Record Patient Account Number: [id number] Number: Treating RN: Cornell Barman [birth year] (82 y.o. 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 She lives at home with her husband who is had recent health problems himself. In any case she was making a bed in her home 2 weeks ago and managed to traumatized her right lower leg on the handle of her reclining chair. Per the patient's description this was a punched-out wound over they made some attempt to suture this together with 7 sutures. She was given a course of oral antibiotics. Her primary physician remove the sutures 7 days later however the wound had already dehisced. Since then she's been using antibiotic ointment and nonadherent pad and some form of wrap applied at an urgent care. She has no prior wound history. She is receiving to rounds of oral antibiotics finishing today. She is not a diabetic and is a long standing ex-smoker. Her only other primary medical diagnosis is congestive heart failure. The  ABI's in this clinic was 0.7 on the right 0.8 on the left 02/09/16; she arrives today with not much change in the wound surface. Still an adherent slough although this is better than last week. Clearly the decision to use Prisma on this was not the right decision, we'll change to Santyl this week. 02/16/16; the wound is smaller still with some depth with adherent  slough. Using Santyl 02/22/16; the wound seems to be smaller less adherent slough it. She has been using Santyl for 2 weeks 02/29/16 patient presents today for a follow-up visit concerning her ongoing right anterior shin traumatic wound. She tells me this point time that she is having only minimal discomfort 1 out of 10 described as a burning sensation occasionally. She has been tolerating the Kerlix and Coban wraps at this point in time very well. Her biggest question is whether or not there is anything she can do to help this in particular I explained that the best thing she can do is keep her wrap dry and otherwise follow the recommendations that been made up to this point. She does have bilateral lower extremity edema which is controlled with wrapping at this point in time on the right. She questions if compression stockings on the left also be of benefit. 03/07/16 patient presents today for a follow-up appointment concerning her ongoing right anterior leg ulcer. Currently she seems to be doing well with the compression wrapping at this point in time. She still states tells me that the pain as a 1 out of 10 at most. We did discuss today trying to improve some of the compression that she is experiencing. However she is unsure that she will be able to tolerate any additional compression as it gets too tight and actually hurts her leg when her leg swells. 03/14/16 traumatic wound on the right anterior lower leg. Arrives with adherent Prisma to the wound today. Electronic Signature(s) Signed: 03/15/2016 8:01:59 AM By: Linton Ham  MD Entered By: Linton Ham on 03/14/2016 14:01:49 Gloria Grimes ([id number]) Gloria Grimes, Gloria Grimes ([id number]) -------------------------------------------------------------------------------- Physical Exam Details Patient Name: Gloria Grimes Date of Service: 03/14/2016 12:45 PM Medical Record Patient Account Number: [id number] Number: Treating RN: Cornell Barman Jun 22, 1930 (82 y.o. Other Clinician: Date of Birth/Sex: Female) Treating Roxana Lai Primary Care Physician/Extender: Curly Rim Physician: Referring Physician: Payton Doughty in Treatment: 5 Constitutional Sitting or standing Blood Pressure is within target range for patient.. Pulse regular and within target range for patient.Marland Kitchen Respirations regular, non-labored and within target range.. Temperature is normal and within the target range for the patient.. Patient has obvious hygiene issues. Patient has oxygen on apparently a recent hospitalization. Cardiovascular Pedal pulses absent bilaterally.. Notes She has a now small punched out area on the right lateral calf. Adherent dressing removed with a curet, base of the wound also debrided of surface slough. No evidence of surrounding soft tissue infection Electronic Signature(s) Signed: 03/15/2016 8:01:59 AM By: Linton Ham MD Entered By: Linton Ham on 03/14/2016 14:03:10 Gloria Grimes ([id number]) -------------------------------------------------------------------------------- Physician Orders Details Patient Name: Gloria Grimes Date of Service: 03/14/2016 12:45 PM Medical Record Patient Account Number: [id number] Number: Treating RN: Cornell Barman 07-Jun-1930 (82 y.o. Other Clinician: Date of Birth/Sex: Female) Treating Lyniah Fujita Primary Care Physician/Extender: Curly Rim Physician: Referring Physician: Payton Doughty in Treatment: 5 Verbal / Phone Orders: Yes Clinician: Cornell Barman Read Back and  Verified: Yes Diagnosis Coding Wound Cleansing Wound #1 Right,Anterior Lower Leg o Cleanse wound with mild soap and water o May shower with protection. o No tub bath. Anesthetic Wound #1 Right,Anterior Lower Leg o Topical Lidocaine 4% cream applied to wound bed prior to debridement Skin Barriers/Peri-Wound Care Wound #1 Right,Anterior Lower Leg o Barrier cream o Moisturizing lotion Primary Wound Dressing Wound #1 Right,Anterior Lower Leg o Prisma Ag Secondary Dressing Wound #1 Right,Anterior Lower Leg o ABD pad  Dressing Change Frequency Wound #1 Right,Anterior Lower Leg o Change dressing every week Follow-up Appointments Wound #1 Right,Anterior Lower Leg o Return Appointment in 1 week. Edema Control Gloria Grimes, Gloria Grimes. ([id number]) Wound #1 Right,Anterior Lower Leg o Elevate legs to the level of the heart and pump ankles as often as possible o Other: - Kerlix and Coban unna to anchor Additional Orders / Instructions Wound #1 Right,Anterior Lower Leg o Increase protein intake. Electronic Signature(s) Signed: 03/14/2016 3:51:22 PM By: Gretta Cool RN, BSN, Kim RN, BSN Signed: 03/15/2016 8:01:59 AM By: Linton Ham MD Entered By: Gretta Cool RN, BSN, Kim on 03/14/2016 13:18:53 Gloria Grimes, Gloria Grimes ([id number]) -------------------------------------------------------------------------------- Problem List Details Patient Name: Gloria Grimes Date of Service: 03/14/2016 12:45 PM Medical Record Patient Account Number: [id number] Number: Treating RN: Cornell Barman [birth year] (82 y.o. Other Clinician: Date of Birth/Sex: Female) Treating Alazar Cherian Primary Care Physician/Extender: Curly Rim Physician: Referring Physician: Payton Doughty in Treatment: 5 Active Problems ICD-10 Encounter Code Description Active Date Diagnosis S81.811S Laceration without foreign body, right lower leg, sequela 02/02/2016 Yes I87.301 Chronic venous  hypertension (idiopathic) without 0000000 Yes complications of right lower extremity Inactive Problems Resolved Problems Electronic Signature(s) Signed: 03/15/2016 8:01:59 AM By: Linton Ham MD Entered By: Linton Ham on 03/14/2016 13:59:54 Gloria Grimes ([id number]) -------------------------------------------------------------------------------- Progress Note Details Patient Name: Gloria Grimes Date of Service: 03/14/2016 12:45 PM Medical Record Patient Account Number: [id number] Number: Treating RN: Cornell Barman 1931/05/24 (82 y.o. 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 She lives at home with her husband who is had recent health problems himself. In any case she was making a bed in her home 2 weeks ago and managed to traumatized her right lower leg on the handle of her reclining chair. Per the patient's description this was a punched-out wound over they made some attempt to suture this together with 7 sutures. She was given a course of oral antibiotics. Her primary physician remove the sutures 7 days later however the wound had already dehisced. Since then she's been using antibiotic ointment and nonadherent pad and some form of wrap applied at an urgent care. She has no prior wound history. She is receiving to rounds of oral antibiotics finishing today. She is not a diabetic and is a long standing ex-smoker. Her only other primary medical diagnosis is congestive heart failure. The ABI's in this clinic was 0.7 on the right 0.8 on the left 02/09/16; she  arrives today with not much change in the wound surface. Still an adherent slough although this is better than last week. Clearly the decision to use Prisma on this was not the right decision, we'll change to Santyl this week. 02/16/16; the wound is smaller still with some depth with adherent slough. Using Santyl 02/22/16; the wound seems to be smaller less adherent slough it. She has been using Santyl for 2 weeks 02/29/16 patient presents today for a follow-up visit concerning her ongoing right anterior shin traumatic wound. She tells me this point time that she is having only minimal discomfort 1 out of 10 described as a burning sensation occasionally. She has been tolerating the Kerlix and Coban wraps at this point in time  very well. Her biggest question is whether or not there is anything she can do to help this in particular I explained that the best thing she can do is keep her wrap dry and otherwise follow the recommendations that been made up to this point. She does have bilateral lower extremity edema which is controlled with wrapping at this point in time on the right. She questions if compression stockings on the left also be of benefit. 03/07/16 patient presents today for a follow-up appointment concerning her ongoing right anterior leg ulcer. Currently she seems to be doing well with the compression wrapping at this point in time. She still states tells me that the pain as a 1 out of 10 at most. We did discuss today trying to improve some of the compression that she is experiencing. However she is unsure that she will be able to tolerate any additional compression as it gets too tight and actually hurts her leg when her leg swells. Gloria Grimes, Gloria Grimes ([id number]) 03/14/16 traumatic wound on the right anterior lower leg. Arrives with adherent Prisma to the wound today. Objective Constitutional Sitting or standing Blood Pressure is within target range for patient.. Pulse regular and within  target range for patient.Marland Kitchen Respirations regular, non-labored and within target range.. Temperature is normal and within the target range for the patient.. Patient has obvious hygiene issues. Patient has oxygen on apparently a recent hospitalization. Vitals Time Taken: 12:53 PM, Height: 62 in, Weight: 156.9 lbs, BMI: 28.7, Temperature: 97.5 F, Pulse: 94 bpm, Respiratory Rate: 18 breaths/min, Blood Pressure: 139/48 mmHg. Cardiovascular Pedal pulses absent bilaterally.. General Notes: She has a now small punched out area on the right lateral calf. Adherent dressing removed with a curet, base of the wound also debrided of surface slough. No evidence of surrounding soft tissue infection Integumentary (Hair, Skin) Wound #1 status is Open. Original cause of wound was Trauma. The wound is located on the Right,Anterior Lower Leg. The wound measures 0.3cm length x 0.4cm width x 0.2cm depth; 0.094cm^2 area and 0.019cm^3 volume. Assessment Active Problems ICD-10 S81.811S - Laceration without foreign body, right lower leg, sequela I87.301 - Chronic venous hypertension (idiopathic) without complications of right lower extremity Procedures Gloria Grimes, Gloria Grimes. ([id number]) Wound #1 Wound #1 is a Trauma, Other located on the Right,Anterior Lower Leg . There was a Skin/Subcutaneous Tissue Debridement [id number]) debridement with total area of 0.12 sq cm performed by Ricard Dillon, MD. with the following instrument(s): Curette to remove Viable and Non-Viable tissue/material including Fibrin/Slough, Eschar, and Subcutaneous after achieving pain control using Other (lidocaine 4%). A time out was conducted at 13:12, prior to the start of the procedure. A Minimum amount of bleeding was controlled with Pressure. The procedure was tolerated well with a pain level of 0 throughout and a pain level of 0 following the procedure. Post Debridement Measurements: 0.3cm length x 0.4cm width x 0.2cm depth; 0.019cm^3  volume. Character of Wound/Ulcer Post Debridement requires further debridement. Severity of Tissue Post Debridement is: Fat layer exposed. Post procedure Diagnosis Wound #1: Same as Pre-Procedure Plan Wound Cleansing: Wound #1 Right,Anterior Lower Leg: Cleanse wound with mild soap and water May shower with protection. No tub bath. Anesthetic: Wound #1 Right,Anterior Lower Leg: Topical Lidocaine 4% cream applied to wound bed prior to debridement Skin Barriers/Peri-Wound Care: Wound #1 Right,Anterior Lower Leg: Barrier cream Moisturizing lotion Primary Wound Dressing: Wound #1 Right,Anterior Lower Leg: Prisma Ag Secondary Dressing: Wound #1 Right,Anterior Lower Leg: ABD pad Dressing Change Frequency: Wound #1 Right,Anterior  Lower Leg: Change dressing every week Follow-up Appointments: Wound #1 Right,Anterior Lower Leg: Return Appointment in 1 week. Edema Control: Wound #1 Right,Anterior Lower Leg: Elevate legs to the level of the heart and pump ankles as often as possible Other: - Kerlix and Coban unna to anchor Additional Orders / Instructions: Gloria Grimes, Gloria Grimes ([id number]) Wound #1 Right,Anterior Lower Leg: Increase protein intake. #1Still prisma/ABD Kerlix and coban Electronic Signature(s) Signed: 03/15/2016 8:01:59 AM By: Linton Ham MD Entered By: Linton Ham on 03/14/2016 14:04:14 Gloria Grimes ([id number]) -------------------------------------------------------------------------------- SuperBill Details Patient Name: Gloria Grimes Date of Service: 03/14/2016 Medical Record Patient Account Number: [id number] Number: Treating RN: Cornell Barman [birth year] (82 y.o. Other Clinician: Date of Birth/Sex: Female) Treating Kamil Mchaffie Primary Care Physician/Extender: Curly Rim Physician: Suella [place] in Treatment: 5 Referring Physician: [place] Pink Diagnosis Coding ICD-10 Codes Code Description [phone] Laceration without foreign body,  right lower leg, sequela I87.301 Chronic venous hypertension (idiopathic) without complications of right lower extremity Facility Procedures CPT4 Code: [id number] Description: [id number] - DEB SUBQ TISSUE 20 SQ CM/< ICD-10 Description Diagnosis S81.811S Laceration without foreign body, right lower leg, Modifier: sequela Quantity: 1 Physician Procedures CPT4 Code: [id number] Description: [id number] - WC PHYS SUBQ TISS 20 SQ CM ICD-10 Description Diagnosis [id number] Laceration without foreign body, right lower leg, Modifier: sequela Quantity: 1 Electronic Signature(s) Signed: 03/15/2016 8:01:59 AM By: Linton Ham MD Entered By: Linton Ham on 03/14/2016 14:04:52

## 2016-03-15 NOTE — Progress Notes (Signed)
DISNEY, GOGUEN ([id number]) Visit Report for 03/14/2016 Arrival Information Details Patient Name: Gloria Grimes, Gloria Grimes Date of Service: 03/14/2016 12:45 PM Medical Record Patient Account Number: [id number] Number: Treating RN: Cornell Barman [birth year] [id number] y.o. Other Clinician: Date of Birth/Sex: Female) Treating ROBSON, MICHAEL Primary Care Physician: [place] Pink Physician/Extender: G Referring Physician: Payton Doughty in Treatment: 5 Visit Information History Since Last Visit Added or deleted any medications: No Patient Arrived: Ambulatory Any new allergies or adverse reactions: No Arrival Time: 12:51 Had a fall or experienced change in No Accompanied By: self activities of daily living that may affect Transfer Assistance: None risk of falls: Patient Identification Verified: Yes Signs or symptoms of abuse/neglect since last No Secondary Verification Process Yes visito Completed: Hospitalized since last visit: No Patient Requires Transmission-Based No Has Dressing in Place as Prescribed: Yes Precautions: Pain Present Now: No Patient Has Alerts: No Electronic Signature(s) Signed: 03/14/2016 3:51:22 PM By: Gretta Cool, RN, BSN, Kim RN, BSN Entered By: Gretta Cool, RN, BSN, Kim on 03/14/2016 12:53:11 Gloria Grimes ([id number]) -------------------------------------------------------------------------------- Encounter Discharge Information Details Patient Name: Gloria Grimes Date of Service: 03/14/2016 12:45 PM Medical Record Patient Account Number: [id number] Number: Treating RN: Cornell Barman 05-27-1931 (82 y.o. Other Clinician: Date of Birth/Sex: Female) Treating ROBSON, MICHAEL Primary Care Physician: [place] Pink Physician/Extender: G Referring Physician: Payton Doughty in Treatment: 5 Encounter Discharge Information Items Discharge Pain Level: 0 Discharge Condition: Stable Ambulatory Status: Cane Discharge Destination:  Home Transportation: Private Auto Accompanied By: self Schedule Follow-up Appointment: Yes Medication Reconciliation completed and provided to Patient/Care Yes Brannon Levene: Provided on Clinical Summary of Care: 03/14/2016 Form Type Recipient Paper Patient JF Electronic Signature(s) Signed: 03/14/2016 3:51:22 PM By: Gretta Cool RN, BSN, Kim RN, BSN Previous Signature: 03/14/2016 1:26:19 PM Version By: Ruthine Dose Entered By: Gretta Cool RN, BSN, Kim on 03/14/2016 13:30:21 Gloria Grimes ([id number]) -------------------------------------------------------------------------------- Lower Extremity Assessment Details Patient Name: Gloria Grimes Date of Service: 03/14/2016 12:45 PM Medical Record Patient Account Number: [id number] Number: Treating RN: Cornell Barman 03/26/1931 (82 y.o. Other Clinician: Date of Birth/Sex: Female) Treating ROBSON, MICHAEL Primary Care Physician: [place] Pink Physician/Extender: G Referring Physician: Payton Doughty in Treatment: 5 Edema Assessment Assessed: [Left: No] [Right: No] E[Left: dema] [Right: :] Calf Left: Right: Point of Measurement: 28 cm From Medial Instep cm 35 cm Ankle Left: Right: Point of Measurement: 10 cm From Medial Instep cm 21.9 cm Vascular Assessment Pulses: Posterior Tibial Palpable: [Right:Yes] Dorsalis Pedis Palpable: [Right:Yes] Extremity colors, hair growth, and conditions: Extremity Color: [Right:Mottled] Hair Growth on Extremity: [Right:No] Temperature of Extremity: [Right:Warm] Capillary Refill: [Right:< 3 seconds] Dependent Rubor: [Right:No] Blanched when Elevated: [Right:No] Lipodermatosclerosis: [Right:No] Toe Nail Assessment Left: Right: Thick: No Discolored: No Deformed: No Improper Length and Hygiene: No Gloria Grimes, Gloria Grimes ([id number]) Electronic Signature(s) Signed: 03/14/2016 3:51:22 PM By: Gretta Cool, RN, BSN, Kim RN, BSN Entered By: Gretta Cool, RN, BSN, Kim on 03/14/2016 13:00:47 Gloria Grimes ([id number]) -------------------------------------------------------------------------------- Multi Wound Chart Details Patient Name: Gloria Grimes Date of Service: 03/14/2016 12:45 PM Medical Record Patient Account Number: [id number] Number: Treating RN: Cornell Barman 07/20/1930 (82 y.o. Other Clinician: Date of Birth/Sex: Female) Treating ROBSON, [place] Primary Care Physician: [place] Pink Physician/Extender: G Referring Physician: Payton Doughty in Treatment: 5 Vital Signs Height(in): 62 Pulse(bpm): 94 Weight(lbs): 156.9 Blood Pressure 139/48 (mmHg): Body Mass Index(BMI): 29 Temperature(F): 97.5 Respiratory Rate 18 (breaths/min): Photos: [1:No Photos] [N/A:N/A] Wound Location: [1:Right, Anterior Lower Leg] [N/A:N/A] Wounding Event: [1:Trauma] [N/A:N/A] Primary Etiology: [1:Trauma, Other] [N/A:N/A]  Date Acquired: [1:01/19/2016] [N/A:N/A] Weeks of Treatment: [1:5] [N/A:N/A] Wound Status: [1:Open] [N/A:N/A] Measurements L x W x D 0.5x0.6x0.2 [N/A:N/A] (cm) Area (cm) : [1:0.236] [N/A:N/A] Volume (cm) : [1:0.047] [N/A:N/A] % Reduction in Area: [1:92.10%] [N/A:N/A] % Reduction in Volume: 94.70% [N/A:N/A] Classification: [1:Full Thickness Without Exposed Support Structures] [N/A:N/A] Periwound Skin Texture: No Abnormalities Noted [N/A:N/A] Periwound Skin [1:No Abnormalities Noted] [N/A:N/A] Moisture: Periwound Skin Color: No Abnormalities Noted [N/A:N/A] Tenderness on [1:No] [N/A:N/A] Treatment Notes Electronic Signature(s) Signed: 03/14/2016 3:51:22 PM By: Gretta Cool, RN, BSN, Kim RN, BSN [address], [place] ([id number]) Entered By: Gretta Cool, RN, BSN, Kim on 03/14/2016 13:15:29 Gloria Grimes ([id number]) -------------------------------------------------------------------------------- [facility] Details Patient Name: Gloria Grimes Date of Service: 03/14/2016 12:45 PM Medical Record Patient Account Number:  [id number] Number: Treating RN: Cornell Barman 1930/10/01 (82 y.o. Other Clinician: Date of Birth/Sex: Female) Treating ROBSON, [place] Primary Care Physician: [place] Pink Physician/Extender: G Referring Physician: Payton Doughty in Treatment: 5 Active Inactive Nutrition Nursing Diagnoses: Imbalanced nutrition Goals: Patient/caregiver agrees to and verbalizes understanding of need to use nutritional supplements and/or vitamins as prescribed Date Initiated: 02/02/2016 Goal Status: Active Interventions: Assess patient nutrition upon admission and as needed per policy Notes: Orientation to the Wound Care Program Nursing Diagnoses: Knowledge deficit related to the wound healing center program Goals: Patient/caregiver will verbalize understanding of the [facility] [place] Program Date Initiated: 02/02/2016 Goal Status: Active Interventions: Provide education on orientation to the wound center Notes: Pain, Acute or Chronic Nursing Diagnoses: Pain, acute or chronic: actual or potential Potential alteration in comfort, pain Gloria Grimes, Gloria Grimes ([id number]) Goals: Patient will verbalize adequate pain control and receive pain control interventions during procedures as needed Date Initiated: 02/02/2016 Goal Status: Active Interventions: Assess comfort goal upon admission Complete pain assessment as per visit requirements Notes: Soft Tissue Infection Nursing Diagnoses: Impaired tissue integrity Goals: Patient/caregiver will verbalize understanding of or measures to prevent infection and contamination in the home setting Date Initiated: 02/02/2016 Goal Status: Active Patient's soft tissue infection will resolve Date Initiated: 02/02/2016 Goal Status: Active Interventions: Assess signs and symptoms of infection every visit Provide education on infection Treatment Activities: Education provided on Infection : 02/02/2016 Notes: Wound/Skin Impairment Nursing  Diagnoses: Impaired tissue integrity Goals: Ulcer/skin breakdown will have a volume reduction of 30% by week 4 Date Initiated: 02/02/2016 Goal Status: Active Ulcer/skin breakdown will have a volume reduction of 50% by week 8 Date Initiated: 02/02/2016 Gloria Grimes, Gloria Grimes ([id number]) Goal Status: Active Ulcer/skin breakdown will have a volume reduction of 80% by week 12 Date Initiated: 02/02/2016 Goal Status: Active Interventions: Assess patient/caregiver ability to obtain necessary supplies Assess ulceration(s) every visit Notes: Electronic Signature(s) Signed: 03/14/2016 3:51:22 PM By: Gretta Cool, RN, BSN, Kim RN, BSN Entered By: Gretta Cool, RN, BSN, Kim on 03/14/2016 13:12:00 Gloria Grimes ([id number]) -------------------------------------------------------------------------------- Pain Assessment Details Patient Name: Gloria Grimes Date of Service: 03/14/2016 12:45 PM Medical Record Patient Account Number: [id number] Number: Treating RN: Cornell Barman [birth year] (82 y.o. Other Clinician: Date of Birth/Sex: Female) Treating ROBSON, MICHAEL Primary Care Physician: [place] Pink Physician/Extender: G Referring Physician: Payton Doughty in Treatment: 5 Active Problems Location of Pain Severity and Description of Pain Patient Has Paino No Site Locations With Dressing Change: No Pain Management and Medication Current Pain Management: Electronic Signature(s) Signed: 03/14/2016 3:51:22 PM By: Gretta Cool, RN, BSN, Kim RN, BSN Entered By: Gretta Cool, RN, BSN, Kim on 03/14/2016 12:53:19 Gloria Grimes ([id number]) -------------------------------------------------------------------------------- Patient/Caregiver Education Details Patient Name: Gloria Grimes Date of Service: 03/14/2016  12:45 PM Medical Record Patient Account Number: [id number] Number: Treating RN: Cornell Barman 1930/09/27 [id number] y.o. Other Clinician: Date of Birth/Gender: Female) Treating ROBSON,  MICHAEL Primary Care Physician: [place] Pink Physician/Extender: G Referring Physician: Payton Doughty in Treatment: 5 Education Assessment Education Provided To: Patient Education Topics Provided Wound/Skin Impairment: Handouts: Caring for Your Ulcer, Other: contuinue wound care as prescrbed Methods: Demonstration Responses: State content correctly Electronic Signature(s) Signed: 03/14/2016 3:51:22 PM By: Gretta Cool, RN, BSN, Kim RN, BSN Entered By: Gretta Cool, RN, BSN, Kim on 03/14/2016 13:30:56 Gloria Grimes ([id number]) -------------------------------------------------------------------------------- Wound Assessment Details Patient Name: Gloria Grimes Date of Service: 03/14/2016 12:45 PM Medical Record Patient Account Number: [id number] Number: Treating RN: Cornell Barman 01/30/1931 (82 y.o. Other Clinician: Date of Birth/Sex: Female) Treating ROBSON, [place] Primary Care Physician: [place] Pink Physician/Extender: G Referring Physician: Payton Doughty in Treatment: 5 Wound Status Wound Number: 1 Primary Etiology: Trauma, Other Wound Location: Right, Anterior Lower Leg Wound Status: Open Wounding Event: Trauma Date Acquired: 01/19/2016 Weeks Of Treatment: 5 Clustered Wound: No Photos Photo Uploaded By: Gretta Cool, RN, BSN, Kim on 03/14/2016 15:49:38 Wound Measurements Length: (cm) 0.3 Width: (cm) 0.4 Depth: (cm) 0.2 Area: (cm) 0.094 Volume: (cm) 0.019 % Reduction in Area: 96.8% % Reduction in Volume: 97.9% Wound Description Full Thickness Without Exposed Classification: Support Structures Periwound Skin Texture Texture Color No Abnormalities Noted: No No Abnormalities Noted: No Moisture No Abnormalities Noted: No Gloria Grimes, Gloria Grimes. ([id number]) Treatment Notes Wound #1 (Right, Anterior Lower Leg) 1. Cleansed with: Cleanse wound with antibacterial soap and water 2. Anesthetic Topical Lidocaine 4% cream to wound bed prior to  debridement 4. Dressing Applied: Prisma Ag 5. Secondary Dressing Applied ABD Pad 7. Secured with Other (specify in notes) Notes kerlix, coban wrap Electronic Signature(s) Signed: 03/14/2016 3:51:22 PM By: Gretta Cool, RN, BSN, Kim RN, BSN Entered By: Gretta Cool, RN, BSN, Kim on 03/14/2016 13:18:06 Gloria Grimes ([id number]) -------------------------------------------------------------------------------- [place] Details Patient Name: Gloria Grimes Date of Service: 03/14/2016 12:45 PM Medical Record Patient Account Number: [id number] Number: Treating RN: Cornell Barman [birth year] (82 y.o. Other Clinician: Date of Birth/Sex: Female) Treating ROBSON, MICHAEL Primary Care Physician: [place] Pink Physician/Extender: G Referring Physician: Payton Doughty in Treatment: 5 Vital Signs Time Taken: 12:53 Temperature (F): 97.5 Height (in): 62 Pulse (bpm): 94 Weight (lbs): 156.9 Respiratory Rate (breaths/min): 18 Body Mass Index (BMI): 28.7 Blood Pressure (mmHg): 139/48 Reference Range: 80 - 120 mg / dl Electronic Signature(s) Signed: 03/14/2016 3:51:22 PM By: Gretta Cool, RN, BSN, Kim RN, BSN Entered By: Gretta Cool, RN, BSN, Kim on 03/14/2016 12:53:48

## 2016-03-21 ENCOUNTER — Encounter: Payer: Medicare Other | Admitting: Internal Medicine

## 2016-03-21 DIAGNOSIS — I87301 Chronic venous hypertension (idiopathic) without complications of right lower extremity: Secondary | ICD-10-CM | POA: Diagnosis not present

## 2016-03-22 NOTE — Progress Notes (Signed)
Gloria Grimes, Gloria Grimes ([id number]) Visit Report for 03/21/2016 Arrival Information Details Patient Name: Gloria Grimes, Gloria Grimes Date of Service: 03/21/2016 12:45 PM Medical Record Patient Account Number: [id number] Number: Treating RN: Montey Hora 1930-09-27 (82 y.o. Other Clinician: Date of Birth/Sex: Female) Treating ROBSON, MICHAEL Primary Care Physician: [place] Pink Physician/Extender: G Referring Physician: Payton Doughty in Treatment: 6 Visit Information History Since Last Visit Added or deleted any medications: No Patient Arrived: Ambulatory Any new allergies or adverse reactions: No Arrival Time: 12:50 Had a fall or experienced change in No Accompanied By: neice activities of daily living that may affect Transfer Assistance: None risk of falls: Patient Identification Verified: Yes Signs or symptoms of abuse/neglect since last No Secondary Verification Process Yes visito Completed: Hospitalized since last visit: No Patient Requires Transmission-Based No Pain Present Now: No Precautions: Patient Has Alerts: No Electronic Signature(s) Signed: 03/21/2016 4:33:42 PM By: Montey Hora Entered By: Montey Hora on 03/21/2016 12:50:34 Gloria Grimes ([id number]) -------------------------------------------------------------------------------- Clinic Level of Care Assessment Details Patient Name: Gloria Grimes Date of Service: 03/21/2016 12:45 PM Medical Record Patient Account Number: [id number] Number: Treating RN: Montey Hora 06-17-1930 (82 y.o. Other Clinician: Date of Birth/Sex: Female) Treating ROBSON, [place] Primary Care Physician: [place] Pink Physician/Extender: G Referring Physician: Payton Doughty in Treatment: 6 Clinic Level of Care Assessment Items TOOL 4 Quantity Score []  - Use when only an EandM is performed on FOLLOW-UP visit 0 ASSESSMENTS - Nursing Assessment / Reassessment X - Reassessment of Co-morbidities  (includes updates in patient status) 1 10 X - Reassessment of Adherence to Treatment Plan 1 5 ASSESSMENTS - Wound and Skin Assessment / Reassessment X - Simple Wound Assessment / Reassessment - one wound 1 5 []  - Complex Wound Assessment / Reassessment - multiple wounds 0 []  - Dermatologic / Skin Assessment (not related to wound area) 0 ASSESSMENTS - Focused Assessment []  - Circumferential Edema Measurements - multi extremities 0 []  - Nutritional Assessment / Counseling / Intervention 0 X - Lower Extremity Assessment (monofilament, tuning fork, pulses) 1 5 []  - Peripheral Arterial Disease Assessment (using hand held doppler) 0 ASSESSMENTS - Ostomy and/or Continence Assessment and Care []  - Incontinence Assessment and Management 0 []  - Ostomy Care Assessment and Management (repouching, etc.) 0 PROCESS - Coordination of Care X - Simple Patient / Family Education for ongoing care 1 15 []  - Complex (extensive) Patient / Family Education for ongoing care 0 []  - Staff obtains Programmer, systems, Records, Test Results / Process Orders 0 []  - Staff telephones HHA, Nursing Homes / Clarify orders / etc 0 Gloria Grimes, GOLDWATER. ([id number]) []  - Routine Transfer to another Facility (non-emergent condition) 0 []  - Routine Hospital Admission (non-emergent condition) 0 []  - New Admissions / Biomedical engineer / Ordering NPWT, Apligraf, etc. 0 []  - Emergency Hospital Admission (emergent condition) 0 X - Simple Discharge Coordination 1 10 []  - Complex (extensive) Discharge Coordination 0 PROCESS - Special Needs []  - Pediatric / Minor Patient Management 0 []  - Isolation Patient Management 0 []  - Hearing / Language / Visual special needs 0 []  - Assessment of Community assistance (transportation, D/C planning, etc.) 0 []  - Additional assistance / Altered mentation 0 []  - Support Surface(s) Assessment (bed, cushion, seat, etc.) 0 INTERVENTIONS - Wound Cleansing / Measurement X - Simple Wound Cleansing - one wound  1 5 []  - Complex Wound Cleansing - multiple wounds 0 X - Wound Imaging (photographs - any number of wounds) 1 5 []  - Wound Tracing (instead of photographs)  0 X - Simple Wound Measurement - one wound 1 5 []  - Complex Wound Measurement - multiple wounds 0 INTERVENTIONS - Wound Dressings []  - Small Wound Dressing one or multiple wounds 0 []  - Medium Wound Dressing one or multiple wounds 0 X - Large Wound Dressing one or multiple wounds 1 20 []  - Application of Medications - topical 0 []  - Application of Medications - injection 0 Gloria Grimes, FELLS. ([id number]) INTERVENTIONS - Miscellaneous []  - External ear exam 0 []  - Specimen Collection (cultures, biopsies, blood, body fluids, etc.) 0 []  - Specimen(s) / Culture(s) sent or taken to Lab for analysis 0 []  - Patient Transfer (multiple staff / Harrel Lemon Lift / Similar devices) 0 []  - Simple Staple / Suture removal (25 or less) 0 []  - Complex Staple / Suture removal (26 or more) 0 []  - Hypo / Hyperglycemic Management (close monitor of Blood Glucose) 0 []  - Ankle / Brachial Index (ABI) - do not check if billed separately 0 X - Vital Signs 1 5 Has the patient been seen at the hospital within the last three years: Yes Total Score: 90 Level Of Care: New/Established - Level 3 Electronic Signature(s) Signed: 03/21/2016 4:33:42 PM By: Montey Hora Entered By: Montey Hora on 03/21/2016 13:31:28 Gloria Grimes ([id number]) -------------------------------------------------------------------------------- Encounter Discharge Information Details Patient Name: Gloria Grimes Date of Service: 03/21/2016 12:45 PM Medical Record Patient Account Number: [id number] Number: Treating RN: Montey Hora 1931-01-21 (82 y.o. Other Clinician: Date of Birth/Sex: Female) Treating ROBSON, MICHAEL Primary Care Physician: [place] Pink Physician/Extender: G Referring Physician: Payton Doughty in Treatment: 6 Encounter Discharge Information  Items Discharge Pain Level: 0 Discharge Condition: Stable Ambulatory Status: Ambulatory Discharge Destination: Home Transportation: Private Auto Accompanied By: neice Schedule Follow-up Appointment: Yes Medication Reconciliation completed and provided to Patient/Care No Giovanny Dugal: Provided on Clinical Summary of Care: 03/21/2016 Form Type Recipient Paper Patient JF Electronic Signature(s) Signed: 03/21/2016 4:33:42 PM By: Montey Hora Previous Signature: 03/21/2016 1:31:57 PM Version By: Ruthine Dose Entered By: Montey Hora on 03/21/2016 13:39:23 Gloria Grimes ([id number]) -------------------------------------------------------------------------------- Lower Extremity Assessment Details Patient Name: Gloria Grimes Date of Service: 03/21/2016 12:45 PM Medical Record Patient Account Number: [id number] Number: Treating RN: Montey Hora [birth year] (82 y.o. Other Clinician: Date of Birth/Sex: Female) Treating ROBSON, MICHAEL Primary Care Physician: [place] Pink Physician/Extender: G Referring Physician: Payton Doughty in Treatment: 6 Edema Assessment Assessed: [Left: No] [Right: No] Edema: [Left: Ye] [Right: s] Calf Left: Right: Point of Measurement: 28 cm From Medial Instep cm 35.4 cm Ankle Left: Right: Point of Measurement: 10 cm From Medial Instep cm 22.2 cm Vascular Assessment Pulses: Posterior Tibial Dorsalis Pedis Palpable: [Right:Yes] Extremity colors, hair growth, and conditions: Extremity Color: [Right:Mottled] Hair Growth on Extremity: [Right:No] Temperature of Extremity: [Right:Warm] Capillary Refill: [Right:< 3 seconds] Electronic Signature(s) Signed: 03/21/2016 4:33:42 PM By: Montey Hora Entered By: Montey Hora on 03/21/2016 12:59:20 Gloria Grimes ([id number]) -------------------------------------------------------------------------------- Multi Wound Chart Details Patient Name: Gloria Grimes Date of Service:  03/21/2016 12:45 PM Medical Record Patient Account Number: [id number] Number: Treating RN: Montey Hora 16-Jun-1930 (82 y.o. Other Clinician: Date of Birth/Sex: Female) Treating ROBSON, [place] Primary Care Physician: [place] Pink Physician/Extender: G Referring Physician: Payton Doughty in Treatment: 6 Vital Signs Height(in): 62 Pulse(bpm): 100 Weight(lbs): 156.9 Blood Pressure 128/65 (mmHg): Body Mass Index(BMI): 29 Temperature(F): 97.9 Respiratory Rate 18 (breaths/min): Photos: [N/A:N/A] Wound Location: Right Lower Leg - Anterior N/A N/A Wounding Event: Trauma N/A N/A Primary Etiology: Trauma, Other N/A  N/A Comorbid History: Congestive Heart Failure, N/A N/A Hypertension Date Acquired: 01/19/2016 N/A N/A Weeks of Treatment: 6 N/A N/A Wound Status: Open N/A N/A Measurements L x W x D 0.1x0.1x0.1 N/A N/A (cm) Area (cm) : 0.008 N/A N/A Volume (cm) : 0.001 N/A N/A % Reduction in Area: 99.70% N/A N/A % Reduction in Volume: 99.90% N/A N/A Classification: Full Thickness Without N/A N/A Exposed Support Structures Exudate Amount: Medium N/A N/A Exudate Type: Serous N/A N/A Exudate Color: amber N/A N/A Wound Margin: Flat and Intact N/A N/A Gloria Grimes, Gloria Grimes. ([id number]) Granulation Amount: Large (67-100%) N/A N/A Granulation Quality: Pink N/A N/A Necrotic Amount: None Present (0%) N/A N/A Exposed Structures: Fascia: No N/A N/A Fat: No Tendon: No Muscle: No Joint: No Bone: No Limited to Skin Breakdown Epithelialization: Small (1-33%) N/A N/A Periwound Skin Texture: Edema: No N/A N/A Excoriation: No Induration: No Callus: No Crepitus: No Fluctuance: No Friable: No Rash: No Scarring: No Periwound Skin Moist: Yes N/A N/A Moisture: Maceration: No Dry/Scaly: No Periwound Skin Color: Atrophie Blanche: No N/A N/A Cyanosis: No Ecchymosis: No Erythema: No Hemosiderin Staining: No Mottled: No Pallor: No Rubor: No Temperature: No  Abnormality N/A N/A Tenderness on No N/A N/A Palpation: Wound Preparation: Ulcer Cleansing: N/A N/A Rinsed/Irrigated with Saline Topical Anesthetic Applied: Other: lidocaine 4% Treatment Notes Electronic Signature(s) Signed: 03/21/2016 4:33:42 PM By: Montey Hora Entered By: Montey Hora on 03/21/2016 13:10:18 Gloria Grimes, Gloria Grimes ([id number]) Gloria Grimes, Gloria Grimes ([id number]) -------------------------------------------------------------------------------- [place] Details Patient Name: Gloria Grimes Date of Service: 03/21/2016 12:45 PM Medical Record Patient Account Number: [id number] Number: Treating RN: Montey Hora [birth year] (82 y.o. Other Clinician: Date of Birth/Sex: Female) Treating ROBSON, [place] Primary Care Physician: [place] Pink Physician/Extender: G Referring Physician: Payton Doughty in Treatment: 6 Active Inactive Nutrition Nursing Diagnoses: Imbalanced nutrition Goals: Patient/caregiver agrees to and verbalizes understanding of need to use nutritional supplements and/or vitamins as prescribed Date Initiated: 02/02/2016 Goal Status: Active Interventions: Assess patient nutrition upon admission and as needed per policy Notes: Orientation to the Wound Care Program Nursing Diagnoses: Knowledge deficit related to the wound healing center program Goals: Patient/caregiver will verbalize understanding of the [facility] Date Initiated: 02/02/2016 Goal Status: Active Interventions: Provide education on orientation to the wound center Notes: Pain, Acute or Chronic Nursing Diagnoses: Pain, acute or chronic: actual or potential Potential alteration in comfort, pain Gloria Grimes, Gloria Grimes ([id number]) Goals: Patient will verbalize adequate pain control and receive pain control interventions during procedures as needed Date Initiated: 02/02/2016 Goal Status: Active Interventions: Assess comfort goal upon  admission Complete pain assessment as per visit requirements Notes: Soft Tissue Infection Nursing Diagnoses: Impaired tissue integrity Goals: Patient/caregiver will verbalize understanding of or measures to prevent infection and contamination in the home setting Date Initiated: 02/02/2016 Goal Status: Active Patient's soft tissue infection will resolve Date Initiated: 02/02/2016 Goal Status: Active Interventions: Assess signs and symptoms of infection every visit Provide education on infection Treatment Activities: Education provided on Infection : 02/16/2016 Notes: Wound/Skin Impairment Nursing Diagnoses: Impaired tissue integrity Goals: Ulcer/skin breakdown will have a volume reduction of 30% by week 4 Date Initiated: 02/02/2016 Goal Status: Active Ulcer/skin breakdown will have a volume reduction of 50% by week 8 Date Initiated: 02/02/2016 Gloria Grimes ([id number]) Goal Status: Active Ulcer/skin breakdown will have a volume reduction of 80% by week 12 Date Initiated: 02/02/2016 Goal Status: Active Interventions: Assess patient/caregiver ability to obtain necessary supplies Assess ulceration(s) every visit Notes: Electronic Signature(s) Signed: 03/21/2016 4:33:42 PM By:  Dorthy, Di Kindle Entered By: Montey Hora on 03/21/2016 13:10:08 Gloria Grimes ([id number]) -------------------------------------------------------------------------------- Pain Assessment Details Patient Name: Gloria Grimes, Gloria Grimes Date of Service: 03/21/2016 12:45 PM Medical Record Patient Account Number: [id number] Number: Treating RN: Montey Hora 24-Aug-1930 (82 y.o. Other Clinician: Date of Birth/Sex: Female) Treating ROBSON, MICHAEL Primary Care Physician: [place] Pink Physician/Extender: G Referring Physician: Payton Doughty in Treatment: 6 Active Problems Location of Pain Severity and Description of Pain Patient Has Paino No Site Locations Pain Management and  Medication Current Pain Management: Notes Topical or injectable lidocaine is offered to patient for acute pain when surgical debridement is performed. If needed, Patient is instructed to use over the counter pain medication for the following 24-48 hours after debridement. Wound care MDs do not prescribed pain medications. Patient has chronic pain or uncontrolled pain. Patient has been instructed to make an appointment with their Primary Care Physician for pain management. Electronic Signature(s) Signed: 03/21/2016 4:33:42 PM By: Montey Hora Entered By: Montey Hora on 03/21/2016 12:50:41 Gloria Grimes ([id number]) -------------------------------------------------------------------------------- Patient/Caregiver Education Details Patient Name: Gloria Grimes Date of Service: 03/21/2016 12:45 PM Medical Record Patient Account Number: [id number] Number: Treating RN: Montey Hora 05/31/1930 (82 y.o. Other Clinician: Date of Birth/Gender: Female) Treating ROBSON, MICHAEL Primary Care Physician: [place] Pink Physician/Extender: G Referring Physician: Payton Doughty in Treatment: 6 Education Assessment Education Provided To: Patient Education Topics Provided Venous: Handouts: Other: continue compression on other leg Methods: Explain/Verbal Responses: State content correctly Electronic Signature(s) Signed: 03/21/2016 4:33:42 PM By: Montey Hora Entered By: Montey Hora on 03/21/2016 13:39:42 Gloria Grimes ([id number]) -------------------------------------------------------------------------------- Wound Assessment Details Patient Name: Gloria Grimes Date of Service: 03/21/2016 12:45 PM Medical Record Patient Account Number: [id number] Number: Treating RN: Montey Hora 1931-03-21 (82 y.o. Other Clinician: Date of Birth/Sex: Female) Treating ROBSON, MICHAEL Primary Care Physician: [place] Pink Physician/Extender: G Referring  Physician: Payton Doughty in Treatment: 6 Wound Status Wound Number: 1 Primary Trauma, Other Etiology: Wound Location: Right Lower Leg - Anterior Wound Status: Open Wounding Event: Trauma Comorbid Congestive Heart Failure, Date Acquired: 01/19/2016 History: Hypertension Weeks Of Treatment: 6 Clustered Wound: No Photos Wound Measurements Length: (cm) 0.1 Width: (cm) 0.1 Depth: (cm) 0.1 Area: (cm) 0.008 Volume: (cm) 0.001 % Reduction in Area: 99.7% % Reduction in Volume: 99.9% Epithelialization: Small (1-33%) Tunneling: No Undermining: No Wound Description Full Thickness Without Exposed Foul Odor After Classification: Support Structures Wound Margin: Flat and Intact Exudate Medium Amount: Exudate Type: Serous Exudate Color: amber Cleansing: No Wound Bed Granulation Amount: Large (67-100%) Exposed Structure Granulation Quality: Pink Fascia Exposed: No Gloria Grimes, ONNEN. ([id number]) Necrotic Amount: None Present (0%) Fat Layer Exposed: No Tendon Exposed: No Muscle Exposed: No Joint Exposed: No Bone Exposed: No Limited to Skin Breakdown Periwound Skin Texture Texture Color No Abnormalities Noted: No No Abnormalities Noted: No Callus: No Atrophie Blanche: No Crepitus: No Cyanosis: No Excoriation: No Ecchymosis: No Fluctuance: No Erythema: No Friable: No Hemosiderin Staining: No Induration: No Mottled: No Localized Edema: No Pallor: No Rash: No Rubor: No Scarring: No Temperature / Pain Moisture Temperature: No Abnormality No Abnormalities Noted: No Dry / Scaly: No Maceration: No Moist: Yes Wound Preparation Ulcer Cleansing: Rinsed/Irrigated with Saline Topical Anesthetic Applied: Other: lidocaine 4%, Treatment Notes Wound #1 (Right, Anterior Lower Leg) 1. Cleansed with: Clean wound with Normal Saline 2. Anesthetic Topical Lidocaine 4% cream to wound bed prior to debridement 3. Peri-wound Care: Moisturizing lotion 4. Dressing  Applied: Prisma Ag 5. Secondary Dressing Applied Dry  Gauze 7. Secured with Other (specify in notes) Notes kerlix, coban wrap NOHEA, GRAF ([id number]) Electronic Signature(s) Signed: 03/21/2016 4:33:42 PM By: Montey Hora Entered By: Montey Hora on 03/21/2016 13:00:54 Gloria Grimes ([id number]) -------------------------------------------------------------------------------- Vitals Details Patient Name: Gloria Grimes Date of Service: 03/21/2016 12:45 PM Medical Record Patient Account Number: [id number] Number: Treating RN: Montey Hora March 26, 1931 (82 y.o. Other Clinician: Date of Birth/Sex: Female) Treating ROBSON, MICHAEL Primary Care Physician: [place] Pink Physician/Extender: G Referring Physician: Payton Doughty in Treatment: 6 Vital Signs Time Taken: 12:53 Temperature (F): 97.9 Height (in): 62 Pulse (bpm): 100 Weight (lbs): 156.9 Respiratory Rate (breaths/min): 18 Body Mass Index (BMI): 28.7 Blood Pressure (mmHg): 128/65 Reference Range: 80 - 120 mg / dl Electronic Signature(s) Signed: 03/21/2016 4:33:42 PM By: Montey Hora Entered By: Montey Hora on 03/21/2016 12:53:54

## 2016-03-22 NOTE — Progress Notes (Addendum)
AZAREA, RISDON ([id number]) Visit Report for 03/21/2016 Chief Complaint Document Details Patient Name: Gloria Grimes, Gloria Grimes Date of Service: 03/21/2016 12:45 PM Medical Record Patient Account Number: [id number] Number: Treating RN: Montey Hora 1930-10-07 (82 y.o. Other Clinician: Date of Birth/Sex: Female) Treating ROBSON, MICHAEL Primary Care Physician/Extender: Curly Rim Physician: Referring Physician: Payton Doughty in Treatment: 6 Information Obtained from: Patient Chief Complaint Traumatic wound on her right lower leg Electronic Signature(s) Signed: 03/21/2016 5:12:34 PM By: Linton Ham MD Entered By: Linton Ham on 03/21/2016 13:36:22 Gloria Grimes ([id number]) -------------------------------------------------------------------------------- HPI Details Patient Name: Gloria Grimes Date of Service: 03/21/2016 12:45 PM Medical Record Patient Account Number: [id number] Number: Treating RN: Montey Hora 26-Aug-1930 (82 y.o. 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 She lives at home with her husband who is had recent health problems himself. In any case she was making a bed in her home 2 weeks ago and managed to traumatized her right lower leg on the handle of her reclining chair. Per the patient's description this was a punched-out wound over they made some attempt to suture this together with 7 sutures. She was given a course of oral antibiotics. Her primary physician remove the sutures 7 days later however the wound had already dehisced. Since then she's been using antibiotic ointment and nonadherent pad and some  form of wrap applied at an urgent care. She has no prior wound history. She is receiving to rounds of oral antibiotics finishing today. She is not a diabetic and is a long standing ex-smoker. Her only other primary medical diagnosis is congestive heart failure. The ABI's in this clinic was 0.7 on the right 0.8 on the left 02/09/16; she arrives today with not much change in the wound surface. Still an adherent slough although this is better than last week. Clearly the decision to use Prisma on this was not the right decision, we'll change to Santyl this week. 02/16/16; the wound is smaller still with some depth with adherent slough. Using Santyl 02/22/16; the wound seems to be smaller less adherent slough it. She has been using Santyl for 2 weeks 02/29/16 patient presents today for a follow-up visit concerning her ongoing right anterior shin traumatic wound. She tells me this point time that she is having only minimal discomfort 1 out of 10 described as a burning sensation occasionally. She has been tolerating the Kerlix and Coban wraps at this point in time very well. Her biggest question is whether or not there is anything she can do to help this in particular I explained that the best thing she can do is keep her wrap dry and otherwise follow the recommendations that been made up to this point. She does have bilateral lower extremity edema which is controlled with wrapping at this point in time on the right. She questions if compression stockings on the left also be of benefit. 03/07/16 patient presents today for a follow-up appointment concerning her ongoing right anterior leg ulcer. Currently she seems to be doing well with the compression wrapping at this point in time. She still states tells me that the pain as a 1 out of 10 at most. We did discuss today trying to improve some of the compression that she is experiencing.  However she is unsure that she will be able to tolerate any  additional compression as it gets too tight and actually hurts her leg when her leg swells. 03/14/16 traumatic wound on the right anterior lower leg. Arrives with adherent Prisma to the wound today. 03/21/16 traumatic wound in the right lower leg this is just about closed over. Only a tiny area remains. She is in the process of moving to an independent apartment in a seniors complex. I have elected to redress this and rewrap this this week Gloria Grimes, Gloria Grimes ([id number]) Electronic Signature(s) Signed: 03/21/2016 5:12:34 PM By: Linton Ham MD Entered By: Linton Ham on 03/21/2016 13:31:34 Gloria Grimes, Gloria Grimes ([id number]) -------------------------------------------------------------------------------- Physical Exam Details Patient Name: Gloria Grimes Date of Service: 03/21/2016 12:45 PM Medical Record Patient Account Number: [id number] Number: Treating RN: Montey Hora August 27, 1930 (82 y.o. Other Clinician: Date of Birth/Sex: Female) Treating ROBSON, MICHAEL Primary Care Physician/Extender: Curly Rim Physician: Referring Physician: Payton Doughty in Treatment: 6 Constitutional Sitting or standing Blood Pressure is within target range for patient.. Pulse regular and within target range for patient.Marland Kitchen Respirations regular, non-labored and within target range.. Temperature is normal and within the target range for the patient.. Patient's appearance is neat and clean. Appears in no acute distress. Well nourished and well developed.. Eyes Conjunctivae clear. No discharge.. Cardiovascular Pedal pulses palpable and strong bilaterally.. Edema present in both extremities. Chronic venous insufficiency. Lymphatic None palpable in the popliteal or inguinal area. Psychiatric No evidence of depression, anxiety, or agitation. Calm, cooperative, and communicative. Appropriate interactions and affect.. Notes Wound exam; the patient still has a small open area on the  right lateral calf. I am confident this will be closed by next week. No debridement is required Electronic Signature(s) Signed: 03/21/2016 5:12:34 PM By: Linton Ham MD Entered By: Linton Ham on 03/21/2016 13:33:22 Gloria Grimes ([id number]) -------------------------------------------------------------------------------- Physician Orders Details Patient Name: Gloria Grimes Date of Service: 03/21/2016 12:45 PM Medical Record Patient Account Number: [id number] Number: Treating RN: Montey Hora 04-Jan-1931 (82 y.o. Other Clinician: Date of Birth/Sex: Female) Treating ROBSON, MICHAEL Primary Care Physician/Extender: Curly Rim Physician: Referring Physician: Payton Doughty in Treatment: 6 Verbal / Phone Orders: Yes Clinician: Montey Hora Read Back and Verified: Yes Diagnosis Coding Wound Cleansing Wound #1 Right,Anterior Lower Leg o Cleanse wound with mild soap and water o May shower with protection. o No tub bath. Anesthetic Wound #1 Right,Anterior Lower Leg o Topical Lidocaine 4% cream applied to wound bed prior to debridement Skin Barriers/Peri-Wound Care Wound #1 Right,Anterior Lower Leg o Barrier cream o Moisturizing lotion Primary Wound Dressing Wound #1 Right,Anterior Lower Leg o Prisma Ag Secondary Dressing Wound #1 Right,Anterior Lower Leg o ABD pad Dressing Change Frequency Wound #1 Right,Anterior Lower Leg o Change dressing every week Follow-up Appointments Wound #1 Right,Anterior Lower Leg o Return Appointment in 1 week. Edema Control BAREERAH, AMRHEIN. ([id number]) Wound #1 Right,Anterior Lower Leg o Elevate legs to the level of the heart and pump ankles as often as possible o Other: - Kerlix and Coban unna to anchor Additional Orders / Instructions Wound #1 Right,Anterior Lower Leg o Increase protein intake. Electronic Signature(s) Signed: 03/21/2016 4:33:42 PM By: Montey Hora Signed: 03/21/2016 5:12:34 PM By: Linton Ham MD Entered By: Montey Hora on 03/21/2016 13:13:46 Gloria Grimes, Gloria Grimes ([id number]) -------------------------------------------------------------------------------- Problem List Details Patient Name: Gloria Grimes Date of Service: 03/21/2016 12:45 PM Medical Record Patient Account Number: [id number] Number: Treating RN: Montey Hora 1931/03/31 [id number]  y.o. Other Clinician: Date of Birth/Sex: Female) Treating ROBSON, MICHAEL Primary Care Physician/Extender: Curly Rim Physician: Referring Physician: Payton Doughty in Treatment: 6 Active Problems ICD-10 Encounter Code Description Active Date Diagnosis S81.811S Laceration without foreign body, right lower leg, sequela 02/02/2016 Yes I87.301 Chronic venous hypertension (idiopathic) without 0000000 Yes complications of right lower extremity Inactive Problems Resolved Problems Electronic Signature(s) Signed: 03/21/2016 5:12:34 PM By: Linton Ham MD Entered By: Linton Ham on 03/21/2016 13:36:09 Gloria Grimes ([id number]) -------------------------------------------------------------------------------- Progress Note Details Patient Name: Gloria Grimes Date of Service: 03/21/2016 12:45 PM Medical Record Patient Account Number: [id number] Number: Treating RN: Montey Hora 1931-05-16 (82 y.o. 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 She lives at home with her husband who is had recent health problems himself. In any case she  was making a bed in her home 2 weeks ago and managed to traumatized her right lower leg on the handle of her reclining chair. Per the patient's description this was a punched-out wound over they made some attempt to suture this together with 7 sutures. She was given a course of oral antibiotics. Her primary physician remove the sutures 7 days later however the wound had already dehisced. Since then she's been using antibiotic ointment and nonadherent pad and some form of wrap applied at an urgent care. She has no prior wound history. She is receiving to rounds of oral antibiotics finishing today. She is not a diabetic and is a long standing ex-smoker. Her only other primary medical diagnosis is congestive heart failure. The ABI's in this clinic was 0.7 on the right 0.8 on the left 02/09/16; she arrives today with not much change in the wound surface. Still an adherent slough although this is better than last week. Clearly the decision to use Prisma on this was not the right decision, we'll change to Santyl this week. 02/16/16; the wound is smaller still with some depth with adherent slough. Using Santyl 02/22/16; the wound seems to be smaller less adherent slough it. She has been using Santyl for 2 weeks 02/29/16 patient presents today for a follow-up visit concerning her ongoing right anterior shin traumatic wound. She tells me this point time that she is having only minimal discomfort 1 out of 10 described as a burning sensation occasionally. She has been tolerating the Kerlix and Coban wraps at this point in time very well. Her biggest question is whether or not there is anything she can do to help this in particular I explained that the best thing she can do is keep her wrap dry and otherwise follow the recommendations that been made up to this point. She does have bilateral lower extremity edema which is controlled with wrapping at this point in time on the right. She questions if compression  stockings on the left also be of benefit. 03/07/16 patient presents today for a follow-up appointment concerning her ongoing right anterior leg ulcer. Currently she seems to be doing well with the compression wrapping at this point in time. She still states tells me that the pain as a 1 out of 10 at most. We did discuss today trying to improve some of the compression that she  is experiencing. However she is unsure that she will be able to tolerate any additional compression as it gets too tight and actually hurts her leg when her leg swells. Gloria Grimes, Gloria Grimes ([id number]) 03/14/16 traumatic wound on the right anterior lower leg. Arrives with adherent Prisma to the wound today. 03/21/16 traumatic wound in the right lower leg this is just about closed over. Only a tiny area remains. She is in the process of moving to an independent apartment in a seniors complex. I have elected to redress this and rewrap this this week Objective Constitutional Sitting or standing Blood Pressure is within target range for patient.. Pulse regular and within target range for patient.Marland Kitchen Respirations regular, non-labored and within target range.. Temperature is normal and within the target range for the patient.. Patient's appearance is neat and clean. Appears in no acute distress. Well nourished and well developed.. Vitals Time Taken: 12:53 PM, Height: 62 in, Weight: 156.9 lbs, BMI: 28.7, Temperature: 97.9 F, Pulse: 100 bpm, Respiratory Rate: 18 breaths/min, Blood Pressure: 128/65 mmHg. Eyes Conjunctivae clear. No discharge.. Cardiovascular Pedal pulses palpable and strong bilaterally.. Edema present in both extremities. Chronic venous insufficiency. Lymphatic None palpable in the popliteal or inguinal area. Psychiatric No evidence of depression, anxiety, or agitation. Calm, cooperative, and communicative. Appropriate interactions and affect.. General Notes: Wound exam; the patient still has a small open area  on the right lateral calf. I am confident this will be closed by next week. No debridement is required Integumentary (Hair, Skin) Wound #1 status is Open. Original cause of wound was Trauma. The wound is located on the Right,Anterior Lower Leg. The wound measures 0.1cm length x 0.1cm width x 0.1cm depth; 0.008cm^2 area and 0.001cm^3 volume. The wound is limited to skin breakdown. There is no tunneling or undermining noted. There is a medium amount of serous drainage noted. The wound margin is flat and intact. There is large (67-100%) pink granulation within the wound bed. There is no necrotic tissue within the wound bed. The periwound skin appearance exhibited: Moist. The periwound skin appearance did not exhibit: Callus, Crepitus, Excoriation, Fluctuance, Friable, Induration, Localized Edema, Rash, Scarring, Dry/Scaly, Maceration, Atrophie Blanche, Cyanosis, Ecchymosis, Hemosiderin Staining, Mottled, Pallor, Rubor, Gloria Grimes, Gloria Grimes. ([id number]) Erythema. Periwound temperature was noted as No Abnormality. Assessment Active Problems ICD-10 S81.811S - Laceration without foreign body, right lower leg, sequela I87.301 - Chronic venous hypertension (idiopathic) without complications of right lower extremity Plan Wound Cleansing: Wound #1 Right,Anterior Lower Leg: Cleanse wound with mild soap and water May shower with protection. No tub bath. Anesthetic: Wound #1 Right,Anterior Lower Leg: Topical Lidocaine 4% cream applied to wound bed prior to debridement Skin Barriers/Peri-Wound Care: Wound #1 Right,Anterior Lower Leg: Barrier cream Moisturizing lotion Primary Wound Dressing: Wound #1 Right,Anterior Lower Leg: Prisma Ag Secondary Dressing: Wound #1 Right,Anterior Lower Leg: ABD pad Dressing Change Frequency: Wound #1 Right,Anterior Lower Leg: Change dressing every week Follow-up Appointments: Wound #1 Right,Anterior Lower Leg: Return Appointment in 1 week. Edema Control: Wound  #1 Right,Anterior Lower Leg: Elevate legs to the level of the heart and pump ankles as often as possible Other: - Kerlix and Coban unna to anchor Additional Orders / Instructions: Wound #1 Right,Anterior Lower Leg: Gloria Grimes, Gloria Grimes. ([id number]) Increase protein intake. #1 will apply Prisma, AbD pad Kerlix and cold and #2 should be closed by next week Electronic Signature(s) Signed: 04/04/2016 1:16:35 PM By: Gretta Cool RN, BSN, Kim RN, BSN Signed: 04/25/2016 7:50:59 AM By: Linton Ham MD Previous Signature: 03/21/2016 5:12:34 PM Version  By: Linton Ham MD Entered By: Gretta Cool, RN, BSN, Kim on 04/04/2016 13:16:35 Gloria Grimes ([id number]) -------------------------------------------------------------------------------- [place] Details Patient Name: Gloria Grimes Date of Service: 03/21/2016 Medical Record Patient Account Number: [id number] Number: Treating RN: Montey Hora 1931-04-18 (82 y.o. Other Clinician: Date of Birth/Sex: Female) Treating ROBSON, MICHAEL Primary Care Physician/Extender: Curly Rim Physician: Suella [place] in Treatment: 6 Referring Physician: [place] Pink Diagnosis Coding ICD-10 Codes Code Description [phone] Laceration without foreign body, right lower leg, sequela I87.301 Chronic venous hypertension (idiopathic) without complications of right lower extremity Facility Procedures CPT4 Code: [id number] Description: [id number] - WOUND CARE VISIT-LEV 3 EST PT Modifier: Quantity: 1 Physician Procedures CPT4 Code: [id number] Description: [id number] - WC PHYS LEVEL 3 - EST PT ICD-10 Description Diagnosis [id number] Laceration without foreign body, right lower leg Modifier: , sequela Quantity: 1 Electronic Signature(s) Signed: 03/21/2016 5:12:34 PM By: Linton Ham MD Entered By: Linton Ham on 03/21/2016 13:34:48

## 2016-03-28 ENCOUNTER — Encounter: Payer: Medicare Other | Admitting: Physician Assistant

## 2016-03-28 DIAGNOSIS — I87301 Chronic venous hypertension (idiopathic) without complications of right lower extremity: Secondary | ICD-10-CM | POA: Diagnosis not present

## 2016-03-29 NOTE — Progress Notes (Signed)
FLORECE, SPIESS ([id number]) Visit Report for 03/28/2016 Arrival Information Details Patient Name: LISAANN, MANGEL 03/28/2016 12:45 Date of Service: PM Medical Record [id number] Number: Patient Account Number: [id number] 1930-12-10 (82 y.o. Treating RN: Montey Hora Date of Birth/Sex: Female) Other Clinician: Primary Care Physician: [place] Pink Treating STONE III, HOYT Referring Physician: [place] Pink Physician/Extender: Weeks in Treatment: 7 Visit Information History Since Last Visit Added or deleted any medications: No Patient Arrived: Ambulatory Any new allergies or adverse reactions: No Arrival Time: 12:54 Had a fall or experienced change in No Accompanied By: self activities of daily living that may affect Transfer Assistance: None risk of falls: Patient Identification Verified: Yes Signs or symptoms of abuse/neglect since last No Secondary Verification Process Yes visito Completed: Hospitalized since last visit: No Patient Requires Transmission-Based No Pain Present Now: No Precautions: Patient Has Alerts: No Electronic Signature(s) Signed: 03/28/2016 5:11:35 PM By: Montey Hora Entered By: Montey Hora on 03/28/2016 12:58:28 [name] ([id number]) -------------------------------------------------------------------------------- Clinic Level of Care Assessment Details Patient Name: MAIREN, BOHAN 03/28/2016 12:45 Date of Service: PM Medical Record [id number] Number: Patient Account Number: [id number] 10-16-1930 (82 y.o. Treating RN: Montey Hora Date of Birth/Sex: Female) Other Clinician: Primary Care Physician: [place] Pink Treating STONE III, HOYT Referring Physician: [place] Pink Physician/Extender: Weeks in Treatment: 7 Clinic Level of Care Assessment Items TOOL 4 Quantity Score []  - Use when only an EandM is performed on FOLLOW-UP visit 0 ASSESSMENTS - Nursing Assessment / Reassessment X - Reassessment of Co-morbidities  (includes updates in patient status) 1 10 X - Reassessment of Adherence to Treatment Plan 1 5 ASSESSMENTS - Wound and Skin Assessment / Reassessment X - Simple Wound Assessment / Reassessment - one wound 1 5 []  - Complex Wound Assessment / Reassessment - multiple wounds 0 []  - Dermatologic / Skin Assessment (not related to wound area) 0 ASSESSMENTS - Focused Assessment []  - Circumferential Edema Measurements - multi extremities 0 []  - Nutritional Assessment / Counseling / Intervention 0 []  - Lower Extremity Assessment (monofilament, tuning fork, pulses) 0 []  - Peripheral Arterial Disease Assessment (using hand held doppler) 0 ASSESSMENTS - Ostomy and/or Continence Assessment and Care []  - Incontinence Assessment and Management 0 []  - Ostomy Care Assessment and Management (repouching, etc.) 0 PROCESS - Coordination of Care []  - Simple Patient / Family Education for ongoing care 0 X - Complex (extensive) Patient / Family Education for ongoing care 1 20 []  - Staff obtains Programmer, systems, Records, Test Results / Process Orders 0 []  - Staff telephones HHA, Nursing Homes / Clarify orders / etc 0 BRAILEIGH, HALPRIN. ([id number]) []  - Routine Transfer to another Facility (non-emergent condition) 0 []  - Routine Hospital Admission (non-emergent condition) 0 []  - New Admissions / Biomedical engineer / Ordering NPWT, Apligraf, etc. 0 []  - Emergency Hospital Admission (emergent condition) 0 X - Simple Discharge Coordination 1 10 []  - Complex (extensive) Discharge Coordination 0 PROCESS - Special Needs []  - Pediatric / Minor Patient Management 0 []  - Isolation Patient Management 0 []  - Hearing / Language / Visual special needs 0 []  - Assessment of Community assistance (transportation, D/C planning, etc.) 0 []  - Additional assistance / Altered mentation 0 []  - Support Surface(s) Assessment (bed, cushion, seat, etc.) 0 INTERVENTIONS - Wound Cleansing / Measurement X - Simple Wound Cleansing - one wound  1 5 []  - Complex Wound Cleansing - multiple wounds 0 X - Wound Imaging (photographs - any number of wounds) 1 5 []  - Wound Tracing (instead of photographs) 0  X - Simple Wound Measurement - one wound 1 5 []  - Complex Wound Measurement - multiple wounds 0 INTERVENTIONS - Wound Dressings []  - Small Wound Dressing one or multiple wounds 0 []  - Medium Wound Dressing one or multiple wounds 0 []  - Large Wound Dressing one or multiple wounds 0 []  - Application of Medications - topical 0 []  - Application of Medications - injection 0 CURSTIN, NORTHROP. ([id number]) INTERVENTIONS - Miscellaneous []  - External ear exam 0 []  - Specimen Collection (cultures, biopsies, blood, body fluids, etc.) 0 []  - Specimen(s) / Culture(s) sent or taken to Lab for analysis 0 []  - Patient Transfer (multiple staff / Harrel Lemon Lift / Similar devices) 0 []  - Simple Staple / Suture removal (25 or less) 0 []  - Complex Staple / Suture removal (26 or more) 0 []  - Hypo / Hyperglycemic Management (close monitor of Blood Glucose) 0 []  - Ankle / Brachial Index (ABI) - do not check if billed separately 0 X - Vital Signs 1 5 Has the patient been seen at the hospital within the last three years: Yes Total Score: 70 Level Of Care: New/Established - Level 2 Electronic Signature(s) Signed: 03/28/2016 5:11:35 PM By: Montey Hora Entered By: Montey Hora on 03/28/2016 13:33:09 [name] ([id number]) -------------------------------------------------------------------------------- Encounter Discharge Information Details Patient Name: DERIA, KULKARNI 03/28/2016 12:45 Date of Service: PM Medical Record [id number] Number: Patient Account Number: [id number] 1931/04/27 (82 y.o. Treating RN: Montey Hora Date of Birth/Sex: Female) Other Clinician: Primary Care Physician: [place] Pink Treating STONE III, HOYT Referring Physician: [place] Pink Physician/Extender: Weeks in Treatment: 7 Encounter Discharge Information  Items Discharge Pain Level: 0 Discharge Condition: Stable Ambulatory Status: Ambulatory Discharge Destination: Home Transportation: Private Auto Accompanied By: self Schedule Follow-up Appointment: Yes Medication Reconciliation completed and provided to Patient/Care No Keslyn Teater: Provided on Clinical Summary of Care: 03/28/2016 Form Type Recipient Paper Patient JF Electronic Signature(s) Signed: 03/28/2016 1:42:37 PM By: Ruthine Dose Entered By: Ruthine Dose on 03/28/2016 13:42:37 [name] ([id number]) -------------------------------------------------------------------------------- Lower Extremity Assessment Details Patient Name: YARIELIZ, PATZNER 03/28/2016 12:45 Date of Service: PM Medical Record [id number] Number: Patient Account Number: [id number] 06/16/1930 (82 y.o. Treating RN: Montey Hora Date of Birth/Sex: Female) Other Clinician: Primary Care Physician: [place] Pink Treating STONE III, HOYT Referring Physician: [place] Pink Physician/Extender: Weeks in Treatment: 7 Edema Assessment Assessed: [Left: No] [Right: No] E[Left: dema] [Right: :] Calf Left: Right: Point of Measurement: 28 cm From Medial Instep cm cm Ankle Left: Right: Point of Measurement: 10 cm From Medial Instep cm cm Vascular Assessment Pulses: Posterior Tibial Dorsalis Pedis Palpable: [Right:Yes] Extremity colors, hair growth, and conditions: Extremity Color: [Right:Hyperpigmented] Hair Growth on Extremity: [Right:No] Temperature of Extremity: [Right:Warm] Capillary Refill: [Right:< 3 seconds] Electronic Signature(s) Signed: 03/28/2016 5:11:35 PM By: Montey Hora Entered By: Montey Hora on 03/28/2016 13:30:45 [name] ([id number]) -------------------------------------------------------------------------------- Echo Details Patient Name: CELITA, HEGGER 03/28/2016 12:45 Date of Service: PM Medical Record [id number] Number: Patient  Account Number: [id number] November 12, 1930 (83 y.o. Treating RN: Montey Hora Date of Birth/Sex: Female) Other Clinician: Primary Care Physician: [place] Pink Treating Melburn Hake, HOYT Referring Physician: [place] Pink Physician/Extender: Weeks in Treatment: 7 Active Inactive Electronic Signature(s) Signed: 03/28/2016 5:11:35 PM By: Montey Hora Entered By: Montey Hora on 03/28/2016 13:32:29 [name] ([id number]) -------------------------------------------------------------------------------- Pain Assessment Details Patient Name: SHADY, CARVALLO 03/28/2016 12:45 Date of Service: PM Medical Record [id number] Number: Patient Account Number: [id number] 07/28/1930 (82 y.o. Treating RN: Montey Hora Date of Birth/Sex: Female) Other  Clinician: Primary Care Physician: [place] Pink Treating Melburn Hake, HOYT Referring Physician: [place] Pink Physician/Extender: Suella [place] in Treatment: 7 Active Problems Location of Pain Severity and Description of Pain Patient Has Paino No Site Locations Pain Management and Medication Current Pain Management: Notes Topical or injectable lidocaine is offered to patient for acute pain when surgical debridement is performed. If needed, Patient is instructed to use over the counter pain medication for the following 24-48 hours after debridement. Wound care MDs do not prescribed pain medications. Patient has chronic pain or uncontrolled pain. Patient has been instructed to make an appointment with their Primary Care Physician for pain management. Electronic Signature(s) Signed: 03/28/2016 5:11:35 PM By: Montey Hora Entered By: Montey Hora on 03/28/2016 12:58:35 [name] ([id number]) -------------------------------------------------------------------------------- Patient/Caregiver Education Details Patient Name: JANASIA, IVES 03/28/2016 12:45 Date of Service: PM Medical Record [id number] Number: Patient Account Number:  [id number] 05/23/1931 (82 y.o. Treating RN: Montey Hora Date of Birth/Gender: Female) Other Clinician: Primary Care Physician: [place] Pink Treating STONE III, HOYT Referring Physician: [place] Pink Physician/Extender: Suella [place] in Treatment: 7 Education Assessment Education Provided To: Patient Education Topics Provided Basic Hygiene: Handouts: Other: skin care of newly healed ulcer site Methods: Demonstration, Explain/Verbal Responses: State content correctly Electronic Signature(s) Signed: 03/28/2016 5:11:35 PM By: Montey Hora Entered By: Montey Hora on 03/28/2016 13:33:46 [name] ([id number]) -------------------------------------------------------------------------------- Wound Assessment Details Patient Name: KAINANI, LEAL 03/28/2016 12:45 Date of Service: PM Medical Record [id number] Number: Patient Account Number: [id number] 09-03-1930 (82 y.o. Treating RN: Montey Hora Date of Birth/Sex: Female) Other Clinician: Primary Care Physician: [place] Pink Treating STONE III, HOYT Referring Physician: [place] Pink Physician/Extender: Weeks in Treatment: 7 Wound Status Wound Number: 1 Primary Trauma, Other Etiology: Wound Location: Right, Anterior Lower Leg Wound Status: Healed - Epithelialized Wounding Event: Trauma Comorbid Congestive Heart Failure, Date Acquired: 01/19/2016 History: Hypertension Weeks Of Treatment: 7 Clustered Wound: No Photos Wound Measurements Length: (cm) 0 % Reduction i Width: (cm) 0 % Reduction i Depth: (cm) 0 Epithelializa Area: (cm) 0 Tunneling: Volume: (cm) 0 Undermining: n Area: 100% n Volume: 100% tion: Large (67-100%) No No Wound Description Full Thickness Without Exposed Classification: Support Structures Wound Margin: Flat and Intact Exudate None Present Amount: Foul Odor After Cleansing: No Wound Bed Granulation Amount: None Present (0%) Exposed Structure Necrotic Amount: None Present  (0%) Fascia Exposed: No Fat Layer Exposed: No Tendon Exposed: No SHERRYLEE, COMTE ([id number]) Muscle Exposed: No Joint Exposed: No Bone Exposed: No Limited to Skin Breakdown Periwound Skin Texture Texture Color No Abnormalities Noted: No No Abnormalities Noted: No Callus: No Atrophie Blanche: No Crepitus: No Cyanosis: No Excoriation: No Ecchymosis: No Fluctuance: No Erythema: No Friable: No Hemosiderin Staining: No Induration: No Mottled: No Localized Edema: No Pallor: No Rash: No Rubor: No Scarring: No Temperature / Pain Moisture Temperature: No Abnormality No Abnormalities Noted: No Dry / Scaly: No Maceration: No Moist: Yes Wound Preparation Ulcer Cleansing: Rinsed/Irrigated with Saline Topical Anesthetic Applied: None Electronic Signature(s) Signed: 03/28/2016 5:11:35 PM By: Montey Hora Entered By: Montey Hora on 03/28/2016 13:32:10 [name] ([id number]) -------------------------------------------------------------------------------- Vitals Details Patient Name: ROSHEENA, OHORA. 03/28/2016 12:45 Date of Service: PM Medical Record [id number] Number: Patient Account Number: [id number] [birth year] (82 y.o. Treating RN: Montey Hora Date of Birth/Sex: Female) Other Clinician: Primary Care Physician: [place] Pink Treating STONE III, HOYT Referring Physician: [place] Pink Physician/Extender: Weeks in Treatment: 7 Vital Signs Time Taken: 13:03 Temperature (F): 97.7 Height (in): 62 Pulse (bpm): 78 Weight (lbs): 156.9 Respiratory Rate (breaths/min):  18 Body Mass Index (BMI): 28.7 Blood Pressure (mmHg): 133/57 Reference Range: 80 - 120 mg / dl Electronic Signature(s) Signed: 03/28/2016 5:11:35 PM By: Montey Hora Entered By: Montey Hora on 03/28/2016 13:04:03

## 2016-03-29 NOTE — Progress Notes (Signed)
Gloria Grimes ([id number]) Visit Report for 03/28/2016 Chief Complaint Document Details Patient Name: Gloria Grimes, Gloria Grimes 03/28/2016 12:45 Date of Service: PM Medical Record [id number] Number: Patient Account Number: [id number] 1931/01/05 (82 y.o. Treating RN: Montey Hora Date of Birth/Sex: Female) Other Clinician: Primary Care Physician: [place] Pink Treating STONE III, Maddyn Lieurance Referring Physician: [place] Pink Physician/Extender: Weeks in Treatment: 7 Information Obtained from: Patient Chief Complaint Traumatic wound on her right lower leg Electronic Signature(s) Signed: 03/29/2016 1:37:38 AM By: Worthy Keeler PA-C Entered By: Worthy Keeler on 03/29/2016 00:24:35 Gloria Grimes, Gloria Grimes ([id number]) -------------------------------------------------------------------------------- HPI Details Patient Name: Gloria Grimes 03/28/2016 12:45 Date of Service: PM Medical Record [id number] Number: Patient Account Number: [id number] 09-29-1930 (82 y.o. 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 She lives at home with her husband who is had recent health problems himself. In any case she was making a bed in her home 2 weeks ago and managed to traumatized her right lower leg on the handle of her reclining chair. Per the patient's description this was a punched-out wound over they made some attempt to suture this together with 7 sutures. She was given a course of oral antibiotics. Her primary physician remove the sutures 7 days later however the wound had already dehisced. Since then she's been using antibiotic ointment and nonadherent pad and some  form of wrap applied at an urgent care. She has no prior wound history. She is receiving to rounds of oral antibiotics finishing today. She is not a diabetic and is a long standing ex-smoker. Her only other primary medical diagnosis is congestive heart failure. The ABI's in this clinic was 0.7 on the right 0.8 on the left 02/09/16; she arrives today with not much change in the wound surface. Still an adherent slough although this is better than last week. Clearly the decision to use Prisma on this was not the right decision, we'll change to Santyl this week. 02/16/16; the wound is smaller still with some depth with adherent slough. Using Santyl 02/22/16; the wound seems to be smaller less adherent slough it. She has been using Santyl for 2 weeks 02/29/16 patient presents today for a follow-up visit concerning her ongoing right anterior shin traumatic wound. She tells me this point time that she is having only minimal discomfort 1 out of 10 described as a burning sensation occasionally. She has been tolerating the Kerlix and Coban wraps at this point in time very well. Her biggest question is whether or not there is anything she can do to help this in particular I explained that the best thing she can do is keep her wrap dry and otherwise follow the recommendations that been made up to this point. She does have bilateral lower extremity edema which is controlled with wrapping at this point in time on the right. She questions if compression stockings on the left also be of benefit. 03/07/16 patient presents today for a follow-up appointment concerning her ongoing right anterior leg ulcer. Currently she seems to be doing well with the compression wrapping at this point in time. She still states tells me that the pain as a 1 out of 10 at most. We did discuss today trying to improve some of the compression that she  is experiencing. However she is unsure that she will be able to tolerate any  additional compression as it gets too tight and actually hurts her leg when her leg swells. 03/14/16 traumatic wound on the right anterior lower leg. Arrives with adherent Prisma to the wound today. 03/21/16 traumatic wound in the right lower leg this is just about closed over. Only a tiny area remains. She is in the process of moving to an independent apartment in a seniors complex. I have elected to redress this and rewrap this this week 03/28/16 at this point in time patient's wound appears to be healed. Overall she is doing well she is having no discomfort and pain and is very pleased to finally here this after so long. Gloria Grimes ([id number]) Electronic Signature(s) Signed: 03/29/2016 1:37:38 AM By: Worthy Keeler PA-C Entered By: Worthy Keeler on 03/29/2016 00:25:46 Gloria Grimes, Gloria Grimes ([id number]) -------------------------------------------------------------------------------- Physical Exam Details Patient Name: Gloria Grimes, Gloria Grimes 03/28/2016 12:45 Date of Service: PM Medical Record [id number] Number: Patient Account Number: [id number] 05-12-1931 (82 y.o. Treating RN: Montey Hora Date of Birth/Sex: Female) Other Clinician: Primary Care Physician: [place] Pink Treating STONE III, Suman Trivedi Referring Physician: [place] Pink Physician/Extender: Weeks in Treatment: 7 Constitutional Well-nourished and well-hydrated in no acute distress. Respiratory normal breathing without difficulty. Psychiatric this patient is able to make decisions and demonstrates good insight into disease process. Alert and Oriented x 3. pleasant and cooperative. Electronic Signature(s) Signed: 03/29/2016 1:37:38 AM By: Worthy Keeler PA-C Entered By: Worthy Keeler on 03/29/2016 00:26:07 Gloria Grimes ([id number]) -------------------------------------------------------------------------------- Physician Orders Details Patient Name: Gloria Grimes, Gloria Grimes 03/28/2016 12:45 Date of Service: PM Medical  Record [id number] Number: Patient Account Number: [id number] 14-Feb-1931 (82 y.o. Treating RN: Montey Hora Date of Birth/Sex: Female) Other Clinician: Primary Care Physician: [place] Pink Treating Melburn Hake, Zahriah Roes Referring Physician: [place] Pink Physician/Extender: Suella [place] in Treatment: 7 Verbal / Phone Orders: Yes Clinician: Montey Hora Read Back and Verified: Yes Diagnosis Coding Discharge From [facility] o Discharge from [place] Signature(s) Signed: 03/28/2016 5:11:35 PM By: Montey Hora Signed: 03/29/2016 1:37:38 AM By: Worthy Keeler PA-C Entered By: Montey Hora on 03/28/2016 13:32:43 Gloria Grimes, Gloria Grimes ([id number]) -------------------------------------------------------------------------------- Problem List Details Patient Name: Gloria Grimes, Gloria Grimes 03/28/2016 12:45 Date of Service: PM Medical Record [id number] Number: Patient Account Number: [id number] Mar 06, 1931 (82 y.o. Treating RN: Montey Hora Date of Birth/Sex: Female) Other Clinician: Primary Care Physician: [place] Pink Treating STONE III, Saydi Kobel Referring Physician: [place] Pink Physician/Extender: Weeks in Treatment: 7 Active Problems ICD-10 Encounter Code Description Active Date Diagnosis S81.811S Laceration without foreign body, right lower leg, sequela 02/02/2016 Yes I87.301 Chronic venous hypertension (idiopathic) without 0000000 Yes complications of right lower extremity Inactive Problems Resolved Problems Electronic Signature(s) Signed: 03/29/2016 1:37:38 AM By: Worthy Keeler PA-C Entered By: Worthy Keeler on 03/29/2016 00:24:23 Gloria Grimes ([id number]) -------------------------------------------------------------------------------- Progress Note Details Patient Name: Gloria Grimes, Gloria Grimes 03/28/2016 12:45 Date of Service: PM Medical Record [id number] Number: Patient Account Number: [id number] [birth year] (82 y.o. 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 She lives at home with her husband who is had recent health problems himself. In any case  she was making a bed in her home 2 weeks ago and managed to traumatized her right lower leg on the handle of her reclining chair. Per the patient's description this was a punched-out wound over they made some attempt to suture this together with 7 sutures. She was given a course of oral antibiotics. Her primary physician remove the sutures 7 days later however the wound had already dehisced. Since then she's been using antibiotic ointment and nonadherent pad and some form of wrap applied at an urgent care. She has no prior wound history. She is receiving to rounds of oral antibiotics finishing today. She is not a diabetic and is a long standing ex-smoker. Her only other primary medical diagnosis is congestive heart failure. The ABI's in this clinic was 0.7 on the right 0.8 on the left 02/09/16; she arrives today with not much change in the wound surface. Still an adherent slough although this is better than last week. Clearly the decision to use Prisma on this was not the right decision, we'll change to Santyl this week. 02/16/16; the wound is smaller still with some depth with adherent slough. Using Santyl 02/22/16; the wound seems to be smaller less adherent slough it. She has been using Santyl for 2 weeks 02/29/16 patient presents today for a follow-up visit concerning her ongoing right anterior shin traumatic wound. She tells me this point time that she is having only  minimal discomfort 1 out of 10 described as a burning sensation occasionally. She has been tolerating the Kerlix and Coban wraps at this point in time very well. Her biggest question is whether or not there is anything she can do to help this in particular I explained that the best thing she can do is keep her wrap dry and otherwise follow the recommendations that been made up to this point. She does have bilateral lower extremity edema which is controlled with wrapping at this point in time on the right. She questions if compression stockings on the left also be of benefit. 03/07/16 patient presents today for a follow-up appointment concerning her ongoing right anterior leg ulcer. Currently she seems to be doing well with the compression wrapping at this point in time. She still states tells me that the pain as a 1 out of 10 at most. We did discuss today trying to improve some of the compression that she is experiencing. However she is unsure that she will be able to tolerate any additional compression as it gets too tight and actually hurts her leg when her leg swells. 03/14/16 traumatic wound on the right anterior lower leg. Arrives with adherent Prisma to the wound today. Gloria Grimes, Gloria Grimes ([id number]) 03/21/16 traumatic wound in the right lower leg this is just about closed over. Only a tiny area remains. She is in the process of moving to an independent apartment in a seniors complex. I have elected to redress this and rewrap this this week 03/28/16 at this point in time patient's wound appears to be healed. Overall she is doing well she is having no discomfort and pain and is very pleased to finally here this after so long. Objective Constitutional Well-nourished and well-hydrated in no acute distress. Vitals Time Taken: 1:03 PM, Height: 62 in, Weight: 156.9 lbs, BMI: 28.7, Temperature: 97.7 F, Pulse: 78 bpm, Respiratory Rate: 18 breaths/min, Blood Pressure: 133/57  mmHg. Respiratory normal breathing without difficulty. Psychiatric this patient is able to make decisions and demonstrates good insight into disease process. Alert and Oriented x  3. pleasant and cooperative. Integumentary (Hair, Skin) Wound #1 status is Healed - Epithelialized. Original cause of wound was Trauma. The wound is located on the Right,Anterior Lower Leg. The wound measures 0cm length x 0cm width x 0cm depth; 0cm^2 area and 0cm^3 volume. The wound is limited to skin breakdown. There is no tunneling or undermining noted. There is a none present amount of drainage noted. The wound margin is flat and intact. There is no granulation within the wound bed. There is no necrotic tissue within the wound bed. The periwound skin appearance exhibited: Moist. The periwound skin appearance did not exhibit: Callus, Crepitus, Excoriation, Fluctuance, Friable, Induration, Localized Edema, Rash, Scarring, Dry/Scaly, Maceration, Atrophie Blanche, Cyanosis, Ecchymosis, Hemosiderin Staining, Mottled, Pallor, Rubor, Erythema. Periwound temperature was noted as No Abnormality. Assessment Active Problems ICD-10 S81.811S - Laceration without foreign body, right lower leg, sequela Gloria Grimes, Gloria Grimes ([id number]) I87.301 - Chronic venous hypertension (idiopathic) without complications of right lower extremity Diagnoses ICD-10 S81.811S: Laceration without foreign body, right lower leg, sequela I87.301: Chronic venous hypertension (idiopathic) without complications of right lower extremity Plan Discharge From [facility] Services: Discharge from [place] Appointments: A follow-up appointment should be scheduled. A Patient Clinical Summary of Care was provided to Gloria Grimes At this point in time patient's wound is healed and therefore I'm going to discharge her from wound care services currently. If she has any other concerns going forward in the future she will contact the office and let me know  otherwise we will continue with a border foam dressing over this just for protection for the next week. It was a pleasure caring for Mrs. Kitto and hopefully she will not have anything like this in the near future. Electronic Signature(s) Signed: 03/29/2016 1:37:38 AM By: Worthy Keeler PA-C Entered By: Worthy Keeler on 03/29/2016 00:27:23 Gloria Grimes, GRANIER ([id number]) -------------------------------------------------------------------------------- SuperBill Details Patient Name: Gloria Grimes Date of Service: 03/28/2016 Medical Record Number: [id number] Patient Account Number: [id number] Date of Birth/Sex: 06-16-1930 (82 y.o. Female) Treating RN: Montey Hora Primary Care Physician: [place] Pink Other Clinician: Referring Physician: [place] Pink Treating Physician/Extender: Melburn Hake, Keitra Carusone Weeks in Treatment: 7 Diagnosis Coding ICD-10 Codes Code Description S81.811S Laceration without foreign body, right lower leg, sequela I87.301 Chronic venous hypertension (idiopathic) without complications of right lower extremity Facility Procedures CPT4 Code: [id number] Description: [phone] - WOUND CARE VISIT-LEV 2 EST PT Modifier: Quantity: 1 Physician Procedures CPT4: Description Modifier Quantity Code [id number] - WC PHYS LEVEL 2 - EST PT 1 ICD-10 Description Diagnosis [id number] Laceration without foreign body, right lower leg, sequela I87.301 Chronic venous hypertension (idiopathic) without complications of  right lower extremity Electronic Signature(s) Signed: 03/29/2016 1:37:38 AM By: Worthy Keeler PA-C Entered By: Worthy Keeler on 03/29/2016 00:26:26

## 2017-01-26 ENCOUNTER — Other Ambulatory Visit: Payer: Self-pay | Admitting: Family Medicine

## 2017-01-26 DIAGNOSIS — Z1231 Encounter for screening mammogram for malignant neoplasm of breast: Secondary | ICD-10-CM

## 2017-06-21 IMAGING — CT CT ANGIO CHEST
2 of 6 series · 18 of 46 positions shown · IV contrast (APPLIED)
Comparison: None.

CLINICAL DATA: Hypoxia. Shortness of breath and cough that started
2 weeks ago.

EXAM:
CT ANGIOGRAPHY CHEST WITH CONTRAST
TECHNIQUE: Multidetector CT imaging of the chest was performed using the
standard protocol during bolus administration of intravenous
contrast. Multiplanar CT image reconstructions and MIPs were
obtained to evaluate the vascular anatomy.
CONTRAST:  75 cc Isovue 370 intravenous

[Series 5: thins · axial · 0.68mm/px · z∈[-322,-58]mm · 16 of 290 slices shown]
[im 13/290  lung]
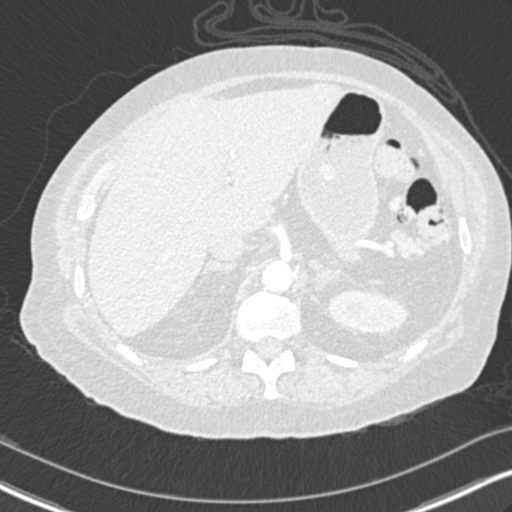
[im 38/290  soft-tissue]
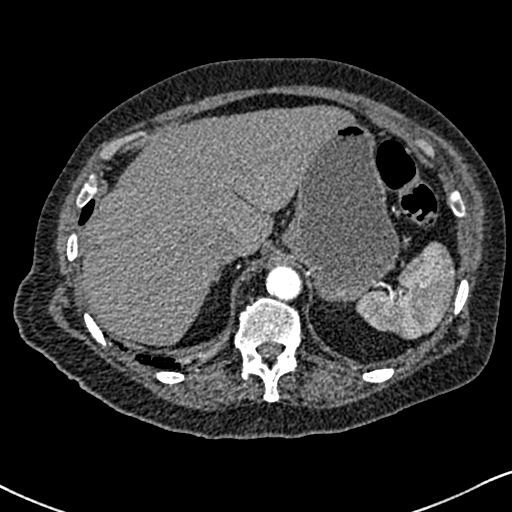
[im 51/290  lung]
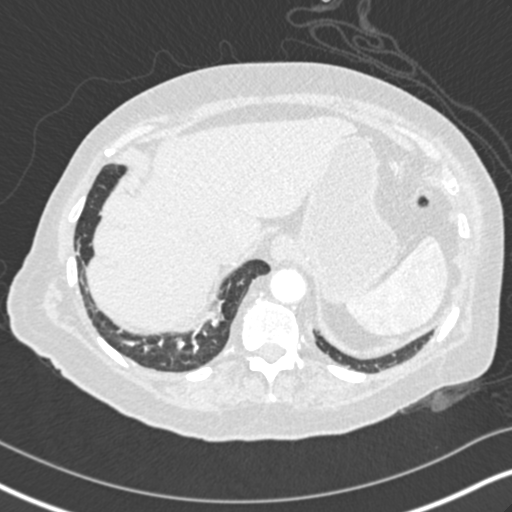
[im 63/290  soft-tissue]
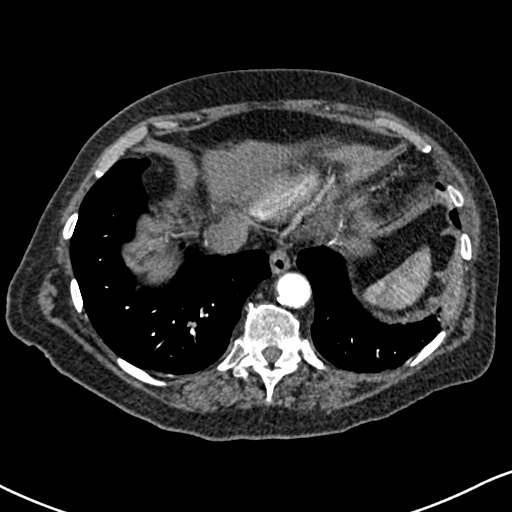
[im 88/290  lung]
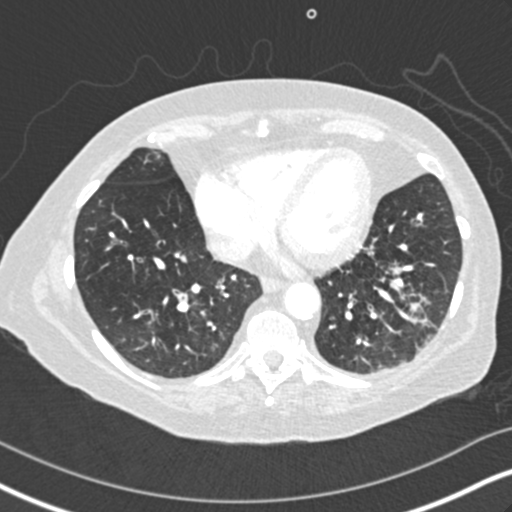
[im 101/290  soft-tissue]
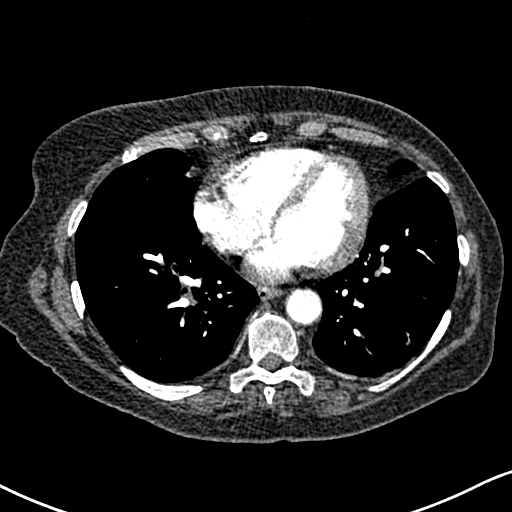
[im 114/290  lung]
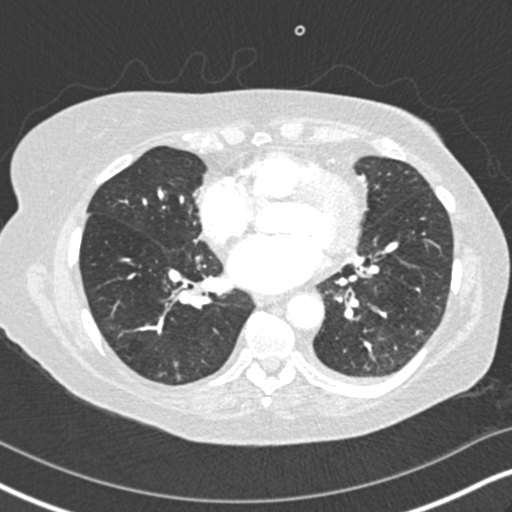
[im 139/290  soft-tissue]
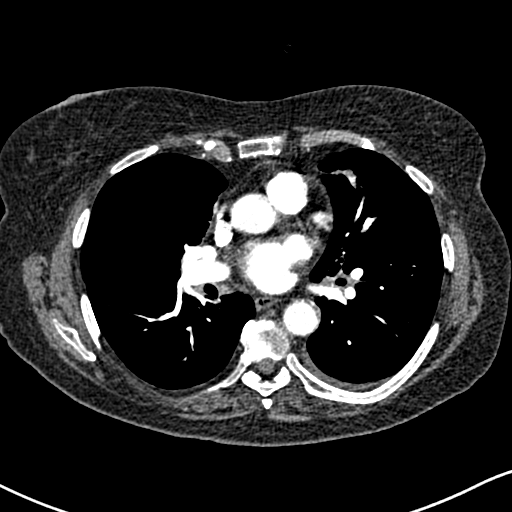
[im 151/290  lung]
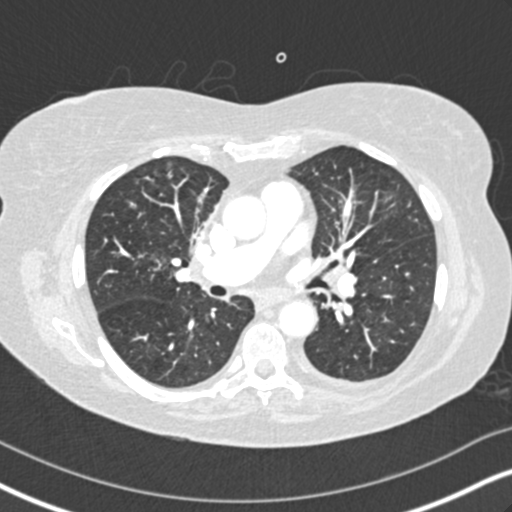
[im 176/290  soft-tissue]
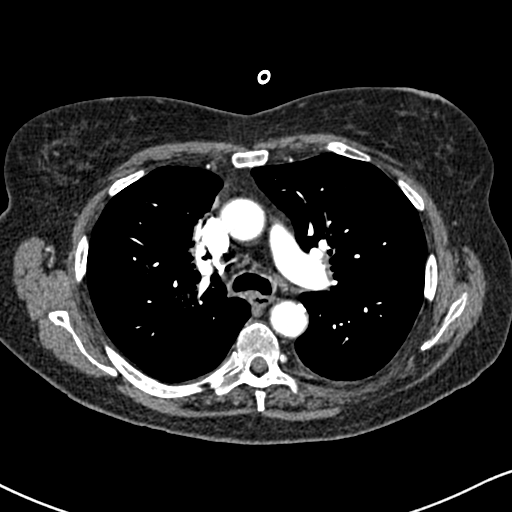
[im 189/290  lung]
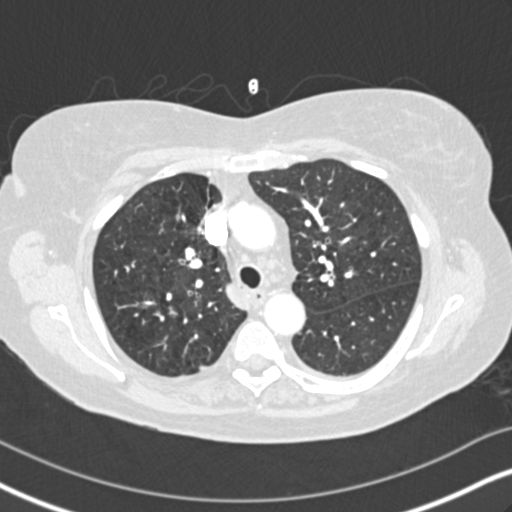
[im 202/290  soft-tissue]
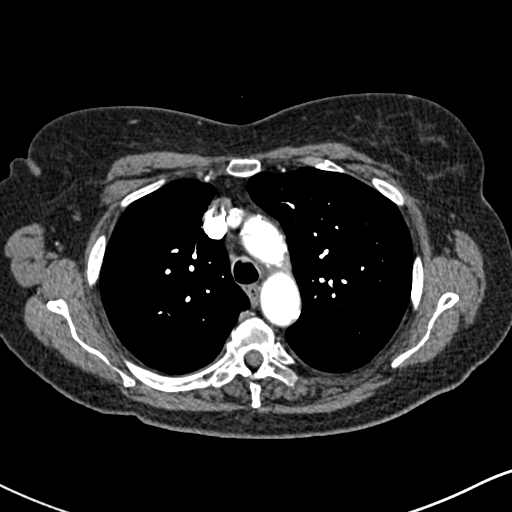
[im 227/290  lung]
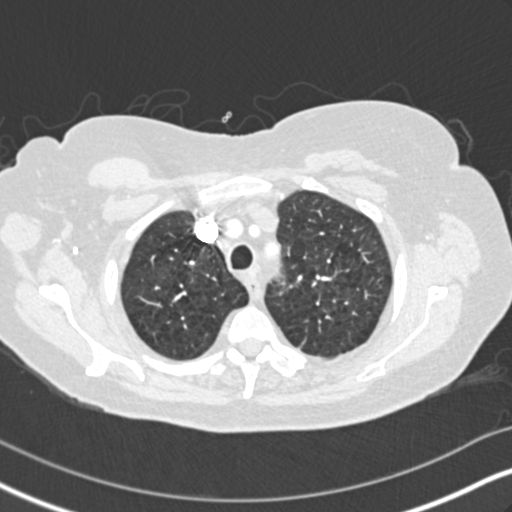
[im 239/290  soft-tissue]
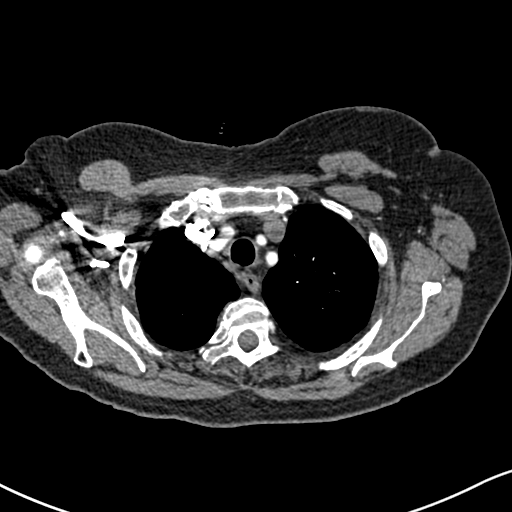
[im 252/290  lung]
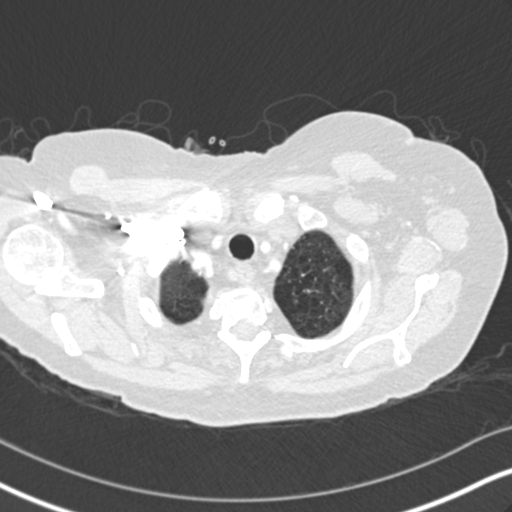
[im 277/290  soft-tissue]
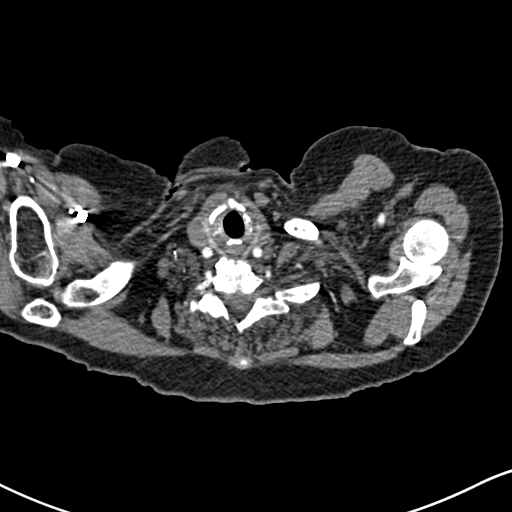

[Series 7: coronal mpr · coronal · 0.59mm/px · 2 of 83 slices shown]
[im 28/83  soft-tissue]
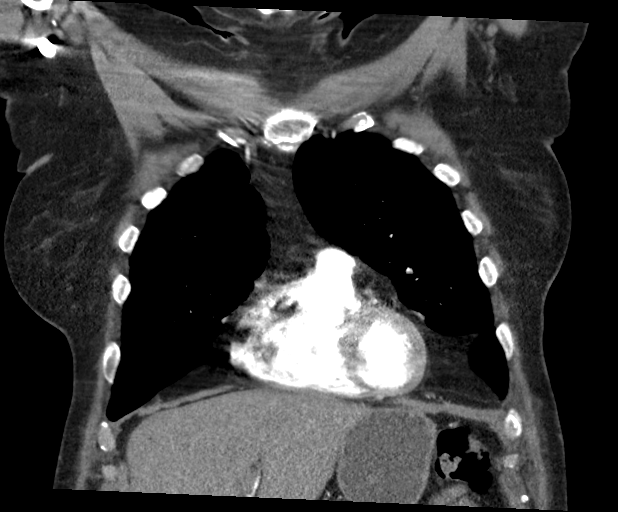
[im 55/83  soft-tissue]
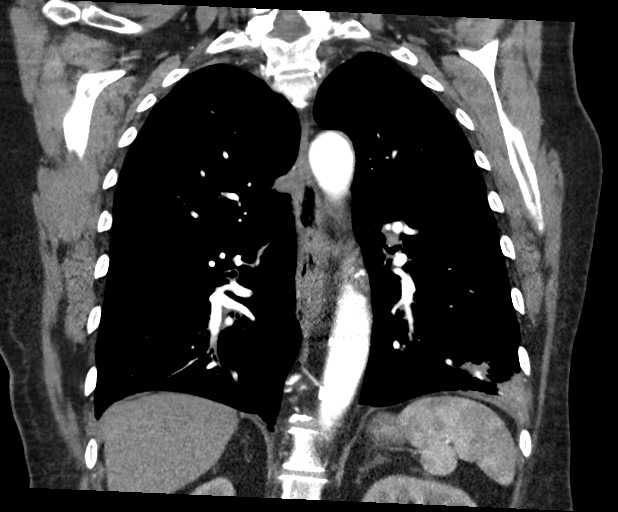

[18 of 46 positions shown; findings below may reference images not displayed]

FINDINGS: Cardiovascular: Satisfactory opacification of the pulmonary arteries
to at least the segmental level. No evidence of pulmonary embolism.
Normal heart size. No pericardial effusion. Atherosclerotic
calcification of the aorta.

Mediastinum/Nodes: No adenopathy or mass.  Negative esophagus.

Lungs/Pleura: Scattered mucoid impaction in airways, which are
diffusely thickened. Left lateral basilar segment airway obstruction
with postobstructive atelectasis/pneumonia. Occasional clusters of
ill-defined nodularity in the right upper lobe and lingula. Trace
left pleural effusion.

Upper Abdomen: Negative

Musculoskeletal: No acute or aggressive finding.

Other: 12 mm mass the 6 o'clock left breast has the same size and
location is a benign cyst on breast ultrasound 10/24/2013.

Review of the MIP images confirms the above findings.
IMPRESSION: 1. Bronchitis with multi focal mucoid impaction causing segmental
left lower lobe atelectasis. Patchy bilateral bronchopneumonia.
2. Negative for pulmonary embolism.
3. Emphysema.

## 2019-11-24 ENCOUNTER — Emergency Department: Payer: Medicare HMO

## 2019-11-24 ENCOUNTER — Encounter: Payer: Self-pay | Admitting: *Deleted

## 2019-11-24 ENCOUNTER — Other Ambulatory Visit: Payer: Self-pay

## 2019-11-24 DIAGNOSIS — Z87891 Personal history of nicotine dependence: Secondary | ICD-10-CM | POA: Insufficient documentation

## 2019-11-24 DIAGNOSIS — Z8543 Personal history of malignant neoplasm of ovary: Secondary | ICD-10-CM | POA: Insufficient documentation

## 2019-11-24 DIAGNOSIS — Y939 Activity, unspecified: Secondary | ICD-10-CM | POA: Insufficient documentation

## 2019-11-24 DIAGNOSIS — S0083XA Contusion of other part of head, initial encounter: Secondary | ICD-10-CM | POA: Insufficient documentation

## 2019-11-24 DIAGNOSIS — Y929 Unspecified place or not applicable: Secondary | ICD-10-CM | POA: Insufficient documentation

## 2019-11-24 DIAGNOSIS — W0110XA Fall on same level from slipping, tripping and stumbling with subsequent striking against unspecified object, initial encounter: Secondary | ICD-10-CM | POA: Insufficient documentation

## 2019-11-24 DIAGNOSIS — Y999 Unspecified external cause status: Secondary | ICD-10-CM | POA: Diagnosis not present

## 2019-11-24 DIAGNOSIS — S8012XA Contusion of left lower leg, initial encounter: Secondary | ICD-10-CM | POA: Insufficient documentation

## 2019-11-24 DIAGNOSIS — S80211A Abrasion, right knee, initial encounter: Secondary | ICD-10-CM | POA: Diagnosis not present

## 2019-11-24 DIAGNOSIS — I509 Heart failure, unspecified: Secondary | ICD-10-CM | POA: Insufficient documentation

## 2019-11-24 DIAGNOSIS — S0990XA Unspecified injury of head, initial encounter: Secondary | ICD-10-CM | POA: Diagnosis present

## 2019-11-24 NOTE — ED Triage Notes (Signed)
Pt reports that she was on a small cart driving in the hallway, got off and then started it back to move it and it started moving and she fell over. She c/o pain in the leg just below the left knee, the right shoulder and the right head. No blood thinners, no LOC.

## 2019-11-25 ENCOUNTER — Encounter: Payer: Self-pay | Admitting: Radiology

## 2019-11-25 ENCOUNTER — Emergency Department: Payer: Medicare HMO

## 2019-11-25 ENCOUNTER — Emergency Department
Admission: EM | Admit: 2019-11-25 | Discharge: 2019-11-25 | Disposition: A | Discharge status: Home / Self Care | Payer: Medicare HMO | Attending: Emergency Medicine | Admitting: Emergency Medicine

## 2019-11-25 DIAGNOSIS — S0990XA Unspecified injury of head, initial encounter: Secondary | ICD-10-CM

## 2019-11-25 DIAGNOSIS — S8012XA Contusion of left lower leg, initial encounter: Secondary | ICD-10-CM

## 2019-11-25 DIAGNOSIS — W19XXXA Unspecified fall, initial encounter: Secondary | ICD-10-CM

## 2019-11-25 NOTE — ED Notes (Signed)
Pt up to bathroom with steady, slow gait. Pt able to walk without assistance by this RN.

## 2019-11-25 NOTE — ED Provider Notes (Signed)
[facility] Emergency Department Provider Note  ____________________________________________   First MD Initiated Contact with Patient 11/25/19 [phone]     (approximate)  I have reviewed the triage vital signs and the nursing notes.   HISTORY  Chief Complaint Fall    HPI Gloria Grimes is a 86 y.o. female with CHF who presents for mechanical fall. Tripped over her scooter. Hit right side of head. Not on blood thinner. No LOC.  Also hit both of her knees.  She does have a little upper right  back pain as well. Pain is moderate, constant, occurred after fall, nothing makes it better or worse.  Denies wanting anything for pain.           Past Medical History:  Diagnosis Date  . Cancer ([place])    ovarian  . CHF (congestive heart failure) [facility])     Patient Active Problem List   Diagnosis Date Noted  . Sepsis ([place]) 03/08/2016    Past Surgical History:  Procedure Laterality Date  . ABDOMINAL HYSTERECTOMY      Prior to Admission medications   Medication Sig Start Date End Date Taking? Authorizing Provider  ascorbic acid (VITAMIN C) 500 MG tablet Take 500 mg by mouth daily.    [provider]  benzonatate (TESSALON) 100 MG capsule Take 1 capsule (100 mg total) by mouth 2 (two) times daily. 03/10/16   Bettey Costa, MD  carvedilol (COREG) 6.25 MG tablet Take 6.25 mg by mouth 2 (two) times daily with a meal.    [provider]  dextromethorphan-guaiFENesin (MUCINEX DM) 30-600 MG 12hr tablet Take 1 tablet by mouth 2 (two) times daily. 03/10/16   Bettey Costa, MD  estrogens, conjugated, (PREMARIN) 0.625 MG tablet Take 0.625 mg by mouth daily.    [provider]  furosemide (LASIX) 40 MG tablet Take 40 mg by mouth daily.    [provider]  guaiFENesin (MUCINEX) 600 MG 12 hr tablet Take 1 tablet (600 mg total) by mouth 2 (two) times daily. 03/10/16   Bettey Costa, MD  levofloxacin (LEVAQUIN) 750 MG tablet Take 1 tablet (750 mg  total) by mouth daily. 03/10/16   Bettey Costa, MD  predniSONE (DELTASONE) 50 MG tablet Take 1 tablet for 5 days then stop 03/10/16   Bettey Costa, MD    Allergies Penicillins and Shellfish allergy  No family history on file.  Social History Social History   Tobacco Use  . Smoking status: Former Research scientist (life sciences)  . Smokeless tobacco: Never Used  Substance Use Topics  . Alcohol use: No    Comment: wine  . Drug use: No      Review of Systems Constitutional: No fever/chills, fall  Eyes: No visual changes. ENT: No sore throat. Cardiovascular: Denies chest pain. Respiratory: Denies shortness of breath. Gastrointestinal: No abdominal pain.  No nausea, no vomiting.  No diarrhea.  No constipation. Genitourinary: Negative for dysuria. Musculoskeletal: . Knee pain, R rib pain  Skin: Negative for rash. Neurological: Negative for headaches, focal weakness or numbness. All other ROS negative ____________________________________________   PHYSICAL EXAM:  VITAL SIGNS: ED Triage Vitals [11/24/19 2001]  Enc Vitals Group     BP (!) 193/75     Pulse Rate 92     Resp 18     Temp 98.1 F (36.7 C)     Temp Source Oral     SpO2 99 %     Weight      Height  Head Circumference      Peak Flow      Pain Score      Pain Loc      Pain Edu?      Excl. in [place]?     Constitutional: Alert and oriented. Well appearing and in no acute distress. Eyes: Conjunctivae are normal. EOMI. Head: hematoma to R side of head.  Nose: No congestion/rhinnorhea. Mouth/Throat: Mucous membranes are moist.   Neck: No stridor. Trachea Midline. FROM.  No C-spine tenderness Cardiovascular: Normal rate, regular rhythm. Grossly normal heart sounds.  Good peripheral circulation.   Respiratory: Normal respiratory effort.  No retractions. Lungs CTAB. Gastrointestinal: Soft and nontender. No distention. No abdominal bruits.  Musculoskeletal: Patient has a hematoma noted to the upper shin on the left.  With some  swelling.  Good distal pulse.  No pain on the knee.  On the right knee she is a small abrasion with some mild tenderness. Neurologic:  Normal speech and language. No gross focal neurologic deficits are appreciated.  Skin:  Skin is warm, dry and intact. No rash noted. Psychiatric: Mood and affect are normal. Speech and behavior are normal. GU: Deferred  Back: Very mild right upper thoracic tenderness.  No midline tenderness ____________________________________________   LABS (all labs ordered are listed, but only abnormal results are displayed)  Labs Reviewed - No data to display ____________________________________________  RADIOLOGY Robert Bellow, personally viewed and evaluated these images (plain radiographs) as part of my medical decision making, as well as reviewing the written report by the radiologist.  ED MD interpretation: No fractures  Official radiology report(s): CT Head Wo Contrast  Result Date: 11/25/2019 CLINICAL DATA:  [place], leg pain EXAM: CT HEAD WITHOUT CONTRAST TECHNIQUE: Contiguous axial images were obtained from the base of the skull through the vertex without intravenous contrast. COMPARISON:  None. FINDINGS: Brain: No acute infarct or hemorrhage. Scattered hypodensities throughout the periventricular white matter consistent with chronic small vessel ischemic change. Lateral ventricles and remaining midline structures are unremarkable. No acute extra-axial fluid collections. No mass effect. Vascular: No hyperdense vessel or unexpected calcification. Skull: Small scalp hematoma along the right parietal vertex. No underlying fracture. Remainder of the calvarium is unremarkable. Sinuses/Orbits: Minimal mucosal thickening left maxillary sinus. Remaining paranasal sinuses are clear. Other: None. IMPRESSION: 1. Right parietal scalp hematoma. 2. No acute intracranial process. Electronically Signed   By: Randa Ngo M.D.   On: 11/25/2019 00:11   CT Cervical Spine Wo  Contrast  Result Date: 11/25/2019 CLINICAL DATA:  [place], leg pain EXAM: CT CERVICAL SPINE WITHOUT CONTRAST TECHNIQUE: Multidetector CT imaging of the cervical spine was performed without intravenous contrast. Multiplanar CT image reconstructions were also generated. COMPARISON:  None. FINDINGS: Alignment: There is loss of normal cervical lordosis with mild kyphosis centered at C5. This is likely due to extensive multilevel spondylosis and facet hypertrophy. Otherwise alignment is anatomic. Skull base and vertebrae: No acute displaced fractures. Soft tissues and spinal canal: No prevertebral fluid or swelling. No visible canal hematoma. Disc levels: There is multilevel spondylosis and facet hypertrophy. Disc space narrowing and osteophyte formation are most pronounced from C4/C5 through C6/C7. There is bony fusion at the C2/C3 facet joints. Marked hypertrophic changes are seen within the facet joints from C3 through C5. Left predominant neural foraminal encroachment is seen at C3/C4. Symmetrical neural foraminal narrowing is seen at C4/C5, C5/C6, and C6/C7. Upper chest: Airway is patent.  Lung apices are clear. Other: Reconstructed images demonstrate no additional findings. IMPRESSION: 1.  No acute cervical spine fracture. 2. Extensive multilevel cervical spondylosis and facet hypertrophy. Electronically Signed   By: Randa Ngo M.D.   On: 11/25/2019 00:14    ____________________________________________   PROCEDURES  Procedure(s) performed (including Critical Care):  Procedures   ____________________________________________   INITIAL IMPRESSION / ASSESSMENT AND PLAN / ED COURSE  Gloria Grimes was evaluated in Emergency Department on 11/25/2019 for the symptoms described in the history of present illness. She was evaluated in the context of the global COVID-19 pandemic, which necessitated consideration that the patient might be at risk for infection with the SARS-CoV-2 virus that causes COVID-19.  Institutional protocols and algorithms that pertain to the evaluation of patients at risk for COVID-19 are in a state of rapid change based on information released by regulatory bodies including the CDC and federal and state organizations. These policies and algorithms were followed during the patient's care in the ED.    Patient is a 86 year old who comes in with concerns for a mechanical fall. Denies syncope so does not need EKG.  CT head/CT cervical to rule out ICH/cervical fractures Xrays to rule out fractures.  Patient does have a little bit of swelling of that left leg but it is all started since the fall.  She denies any swelling of that leg prior to the ball to suggest DVT Offered patient blood work to evaluate for kidney dysfunction, electrolyte abnormalities but patient states that she has been feeling her baseline self and just had blood drawn 1 month ago so do not think we need to repeat today if she does not want to given this was a mechanical fall.  Patient declining anything for pain at this time  CT head and neck are negative.    Tdap in 2017 so UTD.  X-rays are negative.  Discussed with patient the 2 mm nodule and nonemergent CT with her primary care doctor.  She expressed understanding.  Provided copy of report.  Patient was able to ambulate and bear weight.  Patient is comfortable discharge home at this time  I discussed the provisional nature of ED diagnosis, the treatment so far, the ongoing plan of care, follow up appointments and return precautions with the patient and any family or support people present. They expressed understanding and agreed with the plan, discharged home.    _____________________________   FINAL CLINICAL IMPRESSION(S) / ED DIAGNOSES   Final diagnoses:  Accident due to mechanical fall without injury, initial encounter  Injury of head, initial encounter  Hematoma of left lower extremity, initial encounter      MEDICATIONS GIVEN DURING  THIS VISIT:  Medications - No data to display   ED Discharge Orders    None       Note:  This document was prepared using Dragon voice recognition software and may include unintentional dictation errors.   Vanessa Edgefield, MD 11/25/19 [phone]

## 2019-11-25 NOTE — Discharge Instructions (Signed)
Take Tylenol 1 g every 8 hours for your pain.  Your CT scans and x-rays were negative.  You can follow-up with your primary care doctor as needed and return to ER if you develop confusion, fevers or any other concerns    IMPRESSION:  1. No convincing rib fracture.  No evidence of intrathoracic injury.  2. ~2 cm nodular density overlapping the right hilum, not seen in  2017. Nonemergent chest CT is recommended if appropriate for  comorbidities.  3. COPD.

## 2019-12-19 ENCOUNTER — Other Ambulatory Visit: Payer: Self-pay | Admitting: Family Medicine

## 2019-12-19 DIAGNOSIS — R9389 Abnormal findings on diagnostic imaging of other specified body structures: Secondary | ICD-10-CM

## 2019-12-19 DIAGNOSIS — R911 Solitary pulmonary nodule: Secondary | ICD-10-CM

## 2020-01-16 ENCOUNTER — Ambulatory Visit: Payer: Medicare HMO

## 2020-02-20 ENCOUNTER — Ambulatory Visit: Payer: Medicare HMO

## 2020-02-26 ENCOUNTER — Ambulatory Visit: Payer: Medicare HMO

## 2020-03-05 ENCOUNTER — Ambulatory Visit
Admission: RE | Admit: 2020-03-05 | Discharge: 2020-03-05 | Disposition: A | Discharge status: Home / Self Care | Payer: Medicare HMO | Source: Ambulatory Visit | Attending: Family Medicine | Admitting: Family Medicine

## 2020-03-05 ENCOUNTER — Other Ambulatory Visit: Payer: Self-pay

## 2020-03-05 DIAGNOSIS — R911 Solitary pulmonary nodule: Secondary | ICD-10-CM | POA: Insufficient documentation

## 2020-03-05 DIAGNOSIS — R9389 Abnormal findings on diagnostic imaging of other specified body structures: Secondary | ICD-10-CM

## 2020-08-30 ENCOUNTER — Other Ambulatory Visit (HOSPITAL_COMMUNITY): Payer: Self-pay | Admitting: Cardiology

## 2020-08-30 ENCOUNTER — Ambulatory Visit
Admission: RE | Admit: 2020-08-30 | Discharge: 2020-08-30 | Disposition: A | Discharge status: Home / Self Care | Payer: Medicare HMO | Source: Ambulatory Visit | Attending: Cardiology | Admitting: Cardiology

## 2020-08-30 ENCOUNTER — Other Ambulatory Visit: Payer: Self-pay

## 2020-08-30 ENCOUNTER — Other Ambulatory Visit: Payer: Self-pay | Admitting: Cardiology

## 2020-08-30 DIAGNOSIS — M7989 Other specified soft tissue disorders: Secondary | ICD-10-CM | POA: Insufficient documentation

## 2020-08-30 DIAGNOSIS — R911 Solitary pulmonary nodule: Secondary | ICD-10-CM | POA: Insufficient documentation

## 2020-12-10 ENCOUNTER — Other Ambulatory Visit (INDEPENDENT_AMBULATORY_CARE_PROVIDER_SITE_OTHER): Payer: Self-pay | Admitting: Vascular Surgery

## 2020-12-10 DIAGNOSIS — M79674 Pain in right toe(s): Secondary | ICD-10-CM

## 2020-12-10 DIAGNOSIS — I739 Peripheral vascular disease, unspecified: Secondary | ICD-10-CM

## 2020-12-13 ENCOUNTER — Other Ambulatory Visit: Payer: Self-pay

## 2020-12-13 ENCOUNTER — Ambulatory Visit (INDEPENDENT_AMBULATORY_CARE_PROVIDER_SITE_OTHER): Payer: Medicare HMO

## 2020-12-13 ENCOUNTER — Other Ambulatory Visit (INDEPENDENT_AMBULATORY_CARE_PROVIDER_SITE_OTHER): Payer: Self-pay | Admitting: Vascular Surgery

## 2020-12-13 DIAGNOSIS — I739 Peripheral vascular disease, unspecified: Secondary | ICD-10-CM

## 2020-12-13 DIAGNOSIS — M79674 Pain in right toe(s): Secondary | ICD-10-CM

## 2020-12-14 ENCOUNTER — Telehealth (INDEPENDENT_AMBULATORY_CARE_PROVIDER_SITE_OTHER): Payer: Self-pay

## 2020-12-14 ENCOUNTER — Ambulatory Visit (INDEPENDENT_AMBULATORY_CARE_PROVIDER_SITE_OTHER): Payer: Medicare HMO | Admitting: Vascular Surgery

## 2020-12-14 VITALS — BP 158/75 | HR 94 | Ht 62.0 in | Wt 147.0 lb

## 2020-12-14 DIAGNOSIS — I70221 Atherosclerosis of native arteries of extremities with rest pain, right leg: Secondary | ICD-10-CM

## 2020-12-14 DIAGNOSIS — I70229 Atherosclerosis of native arteries of extremities with rest pain, unspecified extremity: Secondary | ICD-10-CM | POA: Insufficient documentation

## 2020-12-14 DIAGNOSIS — I10 Essential (primary) hypertension: Secondary | ICD-10-CM

## 2020-12-14 NOTE — Assessment & Plan Note (Signed)
Her noninvasive studies today show severe peripheral arterial disease with a right ABI of 0.12 with no digital flow appreciated.  Her left ABI is also significantly reduced at 0.51.    Recommend:  The patient has evidence of severe atherosclerotic changes of both lower extremities with rest pain that is associated with preulcerative changes and impending tissue loss of the foot on the right.  This represents a limb threatening ischemia and places the patient at the risk for limb loss.  Her left leg has markedly reduced perfusion, but her symptoms are not as severe and that 1 can be addressed later.  Patient should undergo angiography of the lower extremities with the hope for intervention for limb salvage.  The risks and benefits as well as the alternative therapies was discussed in detail with the patient.  All questions were answered.  Patient agrees to proceed with angiography.  The patient will follow up with me in the office after the procedure.

## 2020-12-14 NOTE — Telephone Encounter (Signed)
Spoke with the patient and she is scheduled with Dr. Lucky Cowboy for a right leg angio on 12/16/20 with a 7:45 am arrival time to the MM. Pre-procedure instructions  were discussed and patient stated she wrote them down and understood.

## 2020-12-14 NOTE — H&P (View-Only) (Signed)
Patient ID: Gloria Grimes, female   DOB: March 11, 1931, 87 y.o.   MRN: [id number]  Chief Complaint  Patient presents with   New Patient (Initial Visit)    NP [place] and consult pain of toe in rt foot / claudication of RLE/PVD of LE  pain referred by [facility] . Larkin Ina     HPI Gloria Grimes is a 87 y.o. female.  I am asked to see the patient by Dr. Vickki Muff for evaluation of right foot pain and discoloration.  She had an ingrown toenail removed sometime ago in that area on the right great toe was poorly healing.  It is covered by bandage today.  Her right foot is ruborous and somewhat swollen.  There is swelling in the left leg as well although not quite as significant.  The left leg is not discolored.  She does have difficulty with walking with tiredness in her legs.  Discomfort does keep her from sleeping well at night.  She does not have any fevers or chills or signs of systemic infection.  Her podiatrist noted poor pedal pulses and the appearance of the foot and referred her for vascular evaluation.  Her noninvasive studies today show severe peripheral arterial disease with a right ABI of 0.12 with no digital flow appreciated.  Her left ABI is also significantly reduced at 0.51.     Past Medical History:  Diagnosis Date   Cancer ([place])    ovarian   CHF (congestive heart failure) ([facility])     Past Surgical History:  Procedure Laterality Date   ABDOMINAL HYSTERECTOMY      Family History No bleeding disorders, clotting disorders, autoimmune diseases, or aneurysms.   Social History   Tobacco Use   Smoking status: Former   Smokeless tobacco: Never  Substance Use Topics   Alcohol use: No    Comment: wine   Drug use: No     Allergies  Allergen Reactions   Azithromycin Other (See Comments)   Clindamycin Hcl Other (See Comments)   Demeclocycline Other (See Comments)   Erythromycin Ethylsuccinate Other (See Comments)   Penicillins    Shellfish Allergy Other (See Comments)    Triamcinolone Acetonide Other (See Comments)    Current Outpatient Medications  Medication Sig Dispense Refill   ascorbic acid (VITAMIN C) 500 MG tablet Take 500 mg by mouth daily.     carvedilol (COREG) 6.25 MG tablet Take 6.25 mg by mouth 2 (two) times daily with a meal.     furosemide (LASIX) 40 MG tablet Take 40 mg by mouth daily.     guaiFENesin (MUCINEX) 600 MG 12 hr tablet Take 1 tablet (600 mg total) by mouth 2 (two) times daily. 30 tablet 0   Lactobacillus-Inulin ([place]) CAPS Take by mouth.     No current facility-administered medications for this visit.      REVIEW OF SYSTEMS (Negative unless checked)  Constitutional: [] Weight loss  [] Fever  [] Chills Cardiac: [] Chest pain   [] Chest pressure   [] Palpitations   [] Shortness of breath when laying flat   [] Shortness of breath at rest   [] Shortness of breath with exertion. Vascular:  [] Pain in legs with walking   [] Pain in legs at rest   [] Pain in legs when laying flat   [] Claudication   [] Pain in feet when walking  [] Pain in feet at rest  [] Pain in feet when laying flat   [] History of DVT   [] Phlebitis   [] Swelling in legs   [] Varicose  veins   [] Non-healing ulcers Pulmonary:   [] Uses home oxygen   [] Productive cough   [] Hemoptysis   [] Wheeze  [] COPD   [] Asthma Neurologic:  [] Dizziness  [] Blackouts   [] Seizures   [] History of stroke   [] History of TIA  [] Aphasia   [] Temporary blindness   [] Dysphagia   [] Weakness or numbness in arms   [] Weakness or numbness in legs Musculoskeletal:  [x] Arthritis   [] Joint swelling   [x] Joint pain   [] Low back pain Hematologic:  [] Easy bruising  [] Easy bleeding   [] Hypercoagulable state   [] Anemic  [] Hepatitis Gastrointestinal:  [] Blood in stool   [] Vomiting blood  [] Gastroesophageal reflux/heartburn   [] Abdominal pain Genitourinary:  [] Chronic kidney disease   [] Difficult urination  [] Frequent urination  [] Burning with urination   [] Hematuria Skin:  [] Rashes   [x] Ulcers    [x] Wounds Psychological:  [] History of anxiety   []  History of major depression.    Physical Exam BP (!) 158/75   Pulse 94   Ht 5\' 2"  (1.575 m)   Wt 147 lb (66.7 kg)   BMI 26.89 kg/m  Gen:  WD/WN, NAD.  Appears younger than stated age Head: [place]/AT, No temporalis wasting.  Ear/Nose/Throat: Hearing grossly intact, nares w/o erythema or drainage, oropharynx w/o Erythema/Exudate Eyes: Conjunctiva clear, sclera non-icteric  Neck: trachea midline.  No JVD.  Pulmonary:  Good air movement, respirations not labored, no use of accessory muscles  Cardiac: Somewhat irregular Vascular:  Vessel Right Left  Radial Palpable Palpable                          DP Not palpable Trace  PT Not palpable 1+   Gastrointestinal:. No masses, surgical incisions, or scars. Musculoskeletal: M/S 5/5 throughout.  Right foot appears chronically ischemic with rubor on dependency and blanching with elevation.  Sluggish capillary refill.  Diffuse varicosities present bilaterally.  No deformity or atrophy.  1+ bilateral lower extremity edema. Neurologic: Sensation grossly intact in extremities.  Symmetrical.  Speech is fluent. Motor exam as listed above. Psychiatric: Judgment intact, Mood & affect appropriate for pt's clinical situation. Dermatologic: No rashes or ulcers noted.  No cellulitis or open wounds.    Radiology No results found.  Labs No results found for this or any previous visit (from the past 2160 hour(s)).  Assessment/Plan:  Atherosclerosis of native arteries of extremity with rest pain ([place]) Her noninvasive studies today show severe peripheral arterial disease with a right ABI of 0.12 with no digital flow appreciated.  Her left ABI is also significantly reduced at 0.51.    Recommend:  The patient has evidence of severe atherosclerotic changes of both lower extremities with rest pain that is associated with preulcerative changes and impending tissue loss of the foot on the right.  This  represents a limb threatening ischemia and places the patient at the risk for limb loss.  Her left leg has markedly reduced perfusion, but her symptoms are not as severe and that 1 can be addressed later.  Patient should undergo angiography of the lower extremities with the hope for intervention for limb salvage.  The risks and benefits as well as the alternative therapies was discussed in detail with the patient.  All questions were answered.  Patient agrees to proceed with angiography.  The patient will follow up with me in the office after the procedure.       Essential hypertension blood pressure control important in reducing the progression of atherosclerotic disease. On appropriate  oral medications.      Leotis Pain 12/14/2020, 12:26 PM   This note was created with Dragon medical transcription system.  Any errors from dictation are unintentional.

## 2020-12-14 NOTE — Progress Notes (Signed)
Patient ID: Gloria Grimes, female   DOB: 05-Mar-1931, 87 y.o.   MRN: [id number]  Chief Complaint  Patient presents with   New Patient (Initial Visit)    NP [place] and consult pain of toe in rt foot / claudication of RLE/PVD of LE  pain referred by [facility] . Larkin Ina     HPI Gloria Grimes is a 87 y.o. female.  I am asked to see the patient by Dr. Vickki Muff for evaluation of right foot pain and discoloration.  She had an ingrown toenail removed sometime ago in that area on the right great toe was poorly healing.  It is covered by bandage today.  Her right foot is ruborous and somewhat swollen.  There is swelling in the left leg as well although not quite as significant.  The left leg is not discolored.  She does have difficulty with walking with tiredness in her legs.  Discomfort does keep her from sleeping well at night.  She does not have any fevers or chills or signs of systemic infection.  Her podiatrist noted poor pedal pulses and the appearance of the foot and referred her for vascular evaluation.  Her noninvasive studies today show severe peripheral arterial disease with a right ABI of 0.12 with no digital flow appreciated.  Her left ABI is also significantly reduced at 0.51.     Past Medical History:  Diagnosis Date   Cancer ([place])    ovarian   CHF (congestive heart failure) ([facility])     Past Surgical History:  Procedure Laterality Date   ABDOMINAL HYSTERECTOMY      Family History No bleeding disorders, clotting disorders, autoimmune diseases, or aneurysms.   Social History   Tobacco Use   Smoking status: Former   Smokeless tobacco: Never  Substance Use Topics   Alcohol use: No    Comment: wine   Drug use: No     Allergies  Allergen Reactions   Azithromycin Other (See Comments)   Clindamycin Hcl Other (See Comments)   Demeclocycline Other (See Comments)   Erythromycin Ethylsuccinate Other (See Comments)   Penicillins    Shellfish Allergy Other (See Comments)    Triamcinolone Acetonide Other (See Comments)    Current Outpatient Medications  Medication Sig Dispense Refill   ascorbic acid (VITAMIN C) 500 MG tablet Take 500 mg by mouth daily.     carvedilol (COREG) 6.25 MG tablet Take 6.25 mg by mouth 2 (two) times daily with a meal.     furosemide (LASIX) 40 MG tablet Take 40 mg by mouth daily.     guaiFENesin (MUCINEX) 600 MG 12 hr tablet Take 1 tablet (600 mg total) by mouth 2 (two) times daily. 30 tablet 0   Lactobacillus-Inulin ([place]) CAPS Take by mouth.     No current facility-administered medications for this visit.      REVIEW OF SYSTEMS (Negative unless checked)  Constitutional: [] Weight loss  [] Fever  [] Chills Cardiac: [] Chest pain   [] Chest pressure   [] Palpitations   [] Shortness of breath when laying flat   [] Shortness of breath at rest   [] Shortness of breath with exertion. Vascular:  [] Pain in legs with walking   [] Pain in legs at rest   [] Pain in legs when laying flat   [] Claudication   [] Pain in feet when walking  [] Pain in feet at rest  [] Pain in feet when laying flat   [] History of DVT   [] Phlebitis   [] Swelling in legs   [] Varicose  veins   [] Non-healing ulcers Pulmonary:   [] Uses home oxygen   [] Productive cough   [] Hemoptysis   [] Wheeze  [] COPD   [] Asthma Neurologic:  [] Dizziness  [] Blackouts   [] Seizures   [] History of stroke   [] History of TIA  [] Aphasia   [] Temporary blindness   [] Dysphagia   [] Weakness or numbness in arms   [] Weakness or numbness in legs Musculoskeletal:  [x] Arthritis   [] Joint swelling   [x] Joint pain   [] Low back pain Hematologic:  [] Easy bruising  [] Easy bleeding   [] Hypercoagulable state   [] Anemic  [] Hepatitis Gastrointestinal:  [] Blood in stool   [] Vomiting blood  [] Gastroesophageal reflux/heartburn   [] Abdominal pain Genitourinary:  [] Chronic kidney disease   [] Difficult urination  [] Frequent urination  [] Burning with urination   [] Hematuria Skin:  [] Rashes   [x] Ulcers    [x] Wounds Psychological:  [] History of anxiety   []  History of major depression.    Physical Exam BP (!) 158/75   Pulse 94   Ht 5\' 2"  (1.575 m)   Wt 147 lb (66.7 kg)   BMI 26.89 kg/m  Gen:  WD/WN, NAD.  Appears younger than stated age Head: [place]/AT, No temporalis wasting.  Ear/Nose/Throat: Hearing grossly intact, nares w/o erythema or drainage, oropharynx w/o Erythema/Exudate Eyes: Conjunctiva clear, sclera non-icteric  Neck: trachea midline.  No JVD.  Pulmonary:  Good air movement, respirations not labored, no use of accessory muscles  Cardiac: Somewhat irregular Vascular:  Vessel Right Left  Radial Palpable Palpable                          DP Not palpable Trace  PT Not palpable 1+   Gastrointestinal:. No masses, surgical incisions, or scars. Musculoskeletal: M/S 5/5 throughout.  Right foot appears chronically ischemic with rubor on dependency and blanching with elevation.  Sluggish capillary refill.  Diffuse varicosities present bilaterally.  No deformity or atrophy.  1+ bilateral lower extremity edema. Neurologic: Sensation grossly intact in extremities.  Symmetrical.  Speech is fluent. Motor exam as listed above. Psychiatric: Judgment intact, Mood & affect appropriate for pt's clinical situation. Dermatologic: No rashes or ulcers noted.  No cellulitis or open wounds.    Radiology No results found.  Labs No results found for this or any previous visit (from the past 2160 hour(s)).  Assessment/Plan:  Atherosclerosis of native arteries of extremity with rest pain ([place]) Her noninvasive studies today show severe peripheral arterial disease with a right ABI of 0.12 with no digital flow appreciated.  Her left ABI is also significantly reduced at 0.51.    Recommend:  The patient has evidence of severe atherosclerotic changes of both lower extremities with rest pain that is associated with preulcerative changes and impending tissue loss of the foot on the right.  This  represents a limb threatening ischemia and places the patient at the risk for limb loss.  Her left leg has markedly reduced perfusion, but her symptoms are not as severe and that 1 can be addressed later.  Patient should undergo angiography of the lower extremities with the hope for intervention for limb salvage.  The risks and benefits as well as the alternative therapies was discussed in detail with the patient.  All questions were answered.  Patient agrees to proceed with angiography.  The patient will follow up with me in the office after the procedure.       Essential hypertension blood pressure control important in reducing the progression of atherosclerotic disease. On appropriate  oral medications.      Leotis Pain 12/14/2020, 12:26 PM   This note was created with Dragon medical transcription system.  Any errors from dictation are unintentional.

## 2020-12-14 NOTE — Patient Instructions (Signed)
Endovascular Therapy for Peripheral Vascular Disease, Care After The following information offers guidance on how to care for yourself after your procedure. Your health care provider may also give you more specific instructions. If you have problems or questions, contact your health careprovider. What can I expect after the procedure? After the procedure, it is common to have: Pain. Soreness and bruising around your puncture or incision (access site). Fatigue. Follow these instructions at home: Access site care  Follow instructions from your health care provider about how to take care of your access site. Make sure you: Wash your hands with soap and water for at least 20 seconds before and after you change your bandage (dressing). If soap and water are not available, use hand sanitizer. Change your dressing as told by your health care provider. Leave stitches (sutures), skin glue, or adhesive strips in place. If adhesive strip edges start to loosen and curl up, you may trim the loose edges. Do not remove adhesive strips or skin glue completely unless your health care provider tells you to do that. Check your access site every day for signs of infection. Check for: More redness, swelling, or pain. A lump or bump. Fluid or blood. Warmth. Pus or a bad smell.  Medicines Take over-the-counter and prescription medicines only as told by your health care provider. You may need to take medicines to prevent blood clots and to lower your cholesterol. If you were prescribed an antibiotic medicine, take it as told by your health care provider. Do not stop taking the antibiotic even if you start to feel better. Driving Do not drive until your health care provider approves. If you were given a sedative during the procedure, it can affect you for several hours. Do not drive or operate machinery until your health care provider says that it is safe. Ask your health care provider if the medicine prescribed  to you requires you to avoid driving or using machinery. Activity Rest as told by your health care provider. Avoid sitting for a long time without moving. Get up to take short walks every 1-2 hours. This is important to improve blood flow and breathing. Ask for help if you feel weak or unsteady. Do not lift anything that is heavier than 10 lb (4.5 kg), or the limit that you are told, until your health care provider says that it is safe. Avoid activity that requires a lot of effort, such as exercise and sports, as told by your health care provider. Avoid sexual activity until your health care provider says it is safe. Follow your exercise plan as told by your health care provider. Return to your normal activities as told by your health care provider. Ask your health care provider what activities are safe for you. Eating and drinking Drink fluids as instructed to help flush out the dye used during the procedure. Follow instructions from your health care provider about eating or drinking restrictions. You may need to eat a diet that is low in salt (sodium) and fat. Avoid drinking alcohol. General instructions Do not take baths, swim, or use a hot tub until your health care provider approves. Ask your health care provider if you may take showers. You may only be allowed to take sponge baths. Do not use any products that contain nicotine or tobacco. These products include cigarettes, chewing tobacco, and vaping devices, such as e-cigarettes. If you need help quitting, ask your health care provider. Keep all follow-up visits. This is important. Contact a health care  provider if: You have a fever. You have severe pain that does not get better with medicine. You have more redness, swelling, or pain around your access site. You have a lump or bump at your access site. Get help right away if:  You have fluid or blood coming from your access site. If this happens, lie down on your back and apply  pressure to the area. You have chest pain. You have problems breathing. You have pain, numbness, or tingling in your legs. You faint. You have any symptoms of a stroke. "BE FAST" is an easy way to remember the main warning signs of a stroke: B - Balance. Signs are dizziness, sudden trouble walking, or loss of balance. E - Eyes. Signs are trouble seeing or a sudden change in vision. F - Face. Signs are sudden weakness or numbness of the face, or the face or eyelid drooping on one side. A - Arms. Signs are weakness or numbness in an arm. This happens suddenly and usually on one side of the body. S - Speech. Signs are sudden trouble speaking, slurred speech, or trouble understanding what people say. T - Time. Time to call emergency services. Write down what time symptoms started. You have other signs of a stroke, such as: A sudden, severe headache with no known cause. Nausea or vomiting. Seizure. These symptoms may represent a serious problem that is an emergency. Do not wait to see if the symptoms will go away. Get medical help right away. Call your local emergency services (911 in the [place] Do not drive yourself to the hospital. Summary After the procedure, it is common to have pain and soreness near your puncture or incision (access site). Check your access site every day for signs of infection, such as redness, swelling, or pain. You may need to take medicines to prevent blood clots and to lower your cholesterol. If you have any signs of a stroke, get help right away. This information is not intended to replace advice given to you by your health care provider. Make sure you discuss any questions you have with your healthcare provider. Document Revised: 11/17/2019 Document Reviewed: 11/17/2019 Elsevier Patient Education  [place].

## 2020-12-14 NOTE — Assessment & Plan Note (Signed)
blood pressure control important in reducing the progression of atherosclerotic disease. On appropriate oral medications.  

## 2020-12-16 ENCOUNTER — Encounter: Payer: Self-pay | Admitting: Vascular Surgery

## 2020-12-16 ENCOUNTER — Encounter
Admission: RE | Disposition: A | Discharge status: Home / Self Care | Payer: Self-pay | Source: Home / Self Care | Attending: Vascular Surgery

## 2020-12-16 ENCOUNTER — Ambulatory Visit
Admission: RE | Admit: 2020-12-16 | Discharge: 2020-12-16 | Disposition: A | Discharge status: Home / Self Care | Payer: Medicare HMO | Attending: Vascular Surgery | Admitting: Vascular Surgery

## 2020-12-16 ENCOUNTER — Other Ambulatory Visit (INDEPENDENT_AMBULATORY_CARE_PROVIDER_SITE_OTHER): Payer: Self-pay | Admitting: Nurse Practitioner

## 2020-12-16 ENCOUNTER — Other Ambulatory Visit: Payer: Self-pay

## 2020-12-16 DIAGNOSIS — Z880 Allergy status to penicillin: Secondary | ICD-10-CM | POA: Diagnosis not present

## 2020-12-16 DIAGNOSIS — Z91013 Allergy to seafood: Secondary | ICD-10-CM | POA: Insufficient documentation

## 2020-12-16 DIAGNOSIS — Z881 Allergy status to other antibiotic agents status: Secondary | ICD-10-CM | POA: Insufficient documentation

## 2020-12-16 DIAGNOSIS — Z79899 Other long term (current) drug therapy: Secondary | ICD-10-CM | POA: Diagnosis not present

## 2020-12-16 DIAGNOSIS — I70221 Atherosclerosis of native arteries of extremities with rest pain, right leg: Secondary | ICD-10-CM | POA: Diagnosis not present

## 2020-12-16 DIAGNOSIS — I70223 Atherosclerosis of native arteries of extremities with rest pain, bilateral legs: Secondary | ICD-10-CM | POA: Diagnosis not present

## 2020-12-16 DIAGNOSIS — I110 Hypertensive heart disease with heart failure: Secondary | ICD-10-CM | POA: Diagnosis not present

## 2020-12-16 DIAGNOSIS — I509 Heart failure, unspecified: Secondary | ICD-10-CM | POA: Insufficient documentation

## 2020-12-16 DIAGNOSIS — I70229 Atherosclerosis of native arteries of extremities with rest pain, unspecified extremity: Secondary | ICD-10-CM

## 2020-12-16 HISTORY — PX: LOWER EXTREMITY ANGIOGRAPHY: CATH118251

## 2020-12-16 LAB — BUN: BUN: 15 mg/dL (ref 8–23)

## 2020-12-16 LAB — CREATININE, SERUM
Creatinine, Ser: 0.52 mg/dL (ref 0.44–1.00)
GFR, Estimated: >60 mL/min (ref >60)

## 2020-12-16 SURGERY — LOWER EXTREMITY ANGIOGRAPHY
Anesthesia: Moderate Sedation | Site: Leg Lower | Laterality: Right

## 2020-12-16 MED ORDER — ACETAMINOPHEN 325 MG PO TABS: 650.0000 mg | ORAL_TABLET | ORAL | Status: DC | PRN

## 2020-12-16 MED ORDER — FAMOTIDINE 20 MG PO TABS: 40.0000 mg | ORAL_TABLET | Freq: Once | ORAL | Status: AC | PRN

## 2020-12-16 MED ORDER — MIDAZOLAM HCL 2 MG/ML PO SYRP: 8.0000 mg | ORAL_SOLUTION | Freq: Once | ORAL | Status: DC | PRN

## 2020-12-16 MED ORDER — SODIUM CHLORIDE 0.9 % IV SOLN: INTRAVENOUS | Status: DC

## 2020-12-16 MED ORDER — ATORVASTATIN CALCIUM 10 MG PO TABS: 10.0000 mg | ORAL_TABLET | Freq: Every day | ORAL | 11 refills | Status: DC

## 2020-12-16 MED ORDER — HYDRALAZINE HCL 20 MG/ML IJ SOLN
5.0000 mg | INTRAMUSCULAR | Status: DC | PRN
Start: 2020-12-16 — End: 2020-12-16

## 2020-12-16 MED ORDER — ATORVASTATIN CALCIUM 10 MG PO TABS: 10.0000 mg | ORAL_TABLET | Freq: Every day | ORAL | Status: DC

## 2020-12-16 MED ORDER — HYDROMORPHONE HCL 1 MG/ML IJ SOLN
INTRAMUSCULAR | Status: AC
  Filled 2020-12-16: qty 0.5

## 2020-12-16 MED ORDER — HEPARIN SODIUM (PORCINE) 1000 UNIT/ML IJ SOLN
INTRAMUSCULAR | Status: DC | PRN
  Administered 2020-12-16: 5000 [IU] via INTRAVENOUS

## 2020-12-16 MED ORDER — MIDAZOLAM HCL 2 MG/2ML IJ SOLN
INTRAMUSCULAR | Status: DC | PRN
  Administered 2020-12-16 (×2): 1 mg via INTRAVENOUS

## 2020-12-16 MED ORDER — FENTANYL CITRATE (PF) 100 MCG/2ML IJ SOLN
INTRAMUSCULAR | Status: DC | PRN
  Administered 2020-12-16 (×4): 25 ug via INTRAVENOUS

## 2020-12-16 MED ORDER — DIPHENHYDRAMINE HCL 50 MG/ML IJ SOLN
INTRAMUSCULAR | Status: AC
  Administered 2020-12-16: 50 mg via INTRAVENOUS
  Filled 2020-12-16: qty 1

## 2020-12-16 MED ORDER — METHYLPREDNISOLONE SODIUM SUCC 125 MG IJ SOLR: 125.0000 mg | Freq: Once | INTRAMUSCULAR | Status: AC | PRN

## 2020-12-16 MED ORDER — CLOPIDOGREL BISULFATE 75 MG PO TABS: 75.0000 mg | ORAL_TABLET | Freq: Every day | ORAL | Status: DC

## 2020-12-16 MED ORDER — SODIUM CHLORIDE 0.9 % IV SOLN: 250.0000 mL | INTRAVENOUS | Status: DC | PRN

## 2020-12-16 MED ORDER — LABETALOL HCL 5 MG/ML IV SOLN: 10.0000 mg | INTRAVENOUS | Status: DC | PRN

## 2020-12-16 MED ORDER — METHYLPREDNISOLONE SODIUM SUCC 125 MG IJ SOLR
INTRAMUSCULAR | Status: AC
  Administered 2020-12-16: 125 mg via INTRAVENOUS
  Filled 2020-12-16: qty 2

## 2020-12-16 MED ORDER — MIDAZOLAM HCL 2 MG/2ML IJ SOLN
INTRAMUSCULAR | Status: AC
  Filled 2020-12-16: qty 2

## 2020-12-16 MED ORDER — VANCOMYCIN HCL IN DEXTROSE 1-5 GM/200ML-% IV SOLN
1000.0000 mg | Freq: Once | INTRAVENOUS | Status: AC
  Administered 2020-12-16: 1000 mg via INTRAVENOUS
  Filled 2020-12-16: qty 200

## 2020-12-16 MED ORDER — FAMOTIDINE 20 MG PO TABS
ORAL_TABLET | ORAL | Status: AC
  Administered 2020-12-16: 40 mg via ORAL
  Filled 2020-12-16: qty 2

## 2020-12-16 MED ORDER — IODIXANOL 320 MG/ML IV SOLN
INTRAVENOUS | Status: DC | PRN
  Administered 2020-12-16: 85 mL

## 2020-12-16 MED ORDER — DIPHENHYDRAMINE HCL 50 MG/ML IJ SOLN: 50.0000 mg | Freq: Once | INTRAMUSCULAR | Status: AC | PRN

## 2020-12-16 MED ORDER — SODIUM CHLORIDE 0.9% FLUSH: 3.0000 mL | INTRAVENOUS | Status: DC | PRN

## 2020-12-16 MED ORDER — SODIUM CHLORIDE 0.9% FLUSH: 3.0000 mL | Freq: Two times a day (BID) | INTRAVENOUS | Status: DC

## 2020-12-16 MED ORDER — CLOPIDOGREL BISULFATE 75 MG PO TABS: 75.0000 mg | ORAL_TABLET | Freq: Every day | ORAL | 11 refills | Status: DC

## 2020-12-16 MED ORDER — FENTANYL CITRATE (PF) 100 MCG/2ML IJ SOLN
INTRAMUSCULAR | Status: AC
  Filled 2020-12-16: qty 2

## 2020-12-16 MED ORDER — ONDANSETRON HCL 4 MG/2ML IJ SOLN: 4.0000 mg | Freq: Four times a day (QID) | INTRAMUSCULAR | Status: DC | PRN

## 2020-12-16 MED ORDER — HEPARIN SODIUM (PORCINE) 1000 UNIT/ML IJ SOLN
INTRAMUSCULAR | Status: AC
  Filled 2020-12-16: qty 1

## 2020-12-16 MED ORDER — HYDROMORPHONE HCL 1 MG/ML IJ SOLN
1.0000 mg | Freq: Once | INTRAMUSCULAR | Status: AC | PRN
Start: 2020-12-16 — End: 2020-12-16
  Administered 2020-12-16: 0.5 mg via INTRAVENOUS

## 2020-12-16 SURGICAL SUPPLY — 34 items
BALLN LUTONIX 018 4X300X130 (BALLOONS) ×2
BALLN LUTONIX 4X120X130 (BALLOONS) ×2
BALLN LUTONIX 5X150X130 (BALLOONS) ×2
BALLN LUTONIX DCB 5X100X130 (BALLOONS) ×2
BALLN LUTONIX DCB 6X60X130 (BALLOONS) ×2
BALLN ULTRVRSE 7X100X75 (BALLOONS) ×2
BALLOON LUTONIX 018 4X300X130 (BALLOONS) ×1 IMPLANT
BALLOON LUTONIX 4X120X130 (BALLOONS) ×1 IMPLANT
BALLOON LUTONIX 5X150X130 (BALLOONS) ×1 IMPLANT
BALLOON LUTONIX DCB 5X100X130 (BALLOONS) ×1 IMPLANT
BALLOON LUTONIX DCB 6X60X130 (BALLOONS) ×1 IMPLANT
BALLOON ULTRVRSE 7X100X75 (BALLOONS) ×1 IMPLANT
CATH ANGIO 5F PIGTAIL 65CM (CATHETERS) ×2 IMPLANT
CATH TEMPO 5F RIM 65CM (CATHETERS) ×2 IMPLANT
CATH VERT 5X100 (CATHETERS) ×2 IMPLANT
CATH VS15FR (CATHETERS) ×2 IMPLANT
COVER PROBE U/S 5X48 (MISCELLANEOUS) ×2 IMPLANT
DEVICE STARCLOSE SE CLOSURE (Vascular Products) ×2 IMPLANT
GLIDEWIRE ADV .035X260CM (WIRE) ×2 IMPLANT
KIT ENCORE 26 ADVANTAGE (KITS) ×2 IMPLANT
PACK ANGIOGRAPHY (CUSTOM PROCEDURE TRAY) ×2 IMPLANT
SHEATH ANL2 6FRX45 HC (SHEATH) ×2 IMPLANT
SHEATH BRITE TIP 5FRX11 (SHEATH) ×4 IMPLANT
SHEATH FLEXOR ANSEL2 7FRX45 (SHEATH) ×2 IMPLANT
STENT LIFESTENT 5F 6X100X135 (Permanent Stent) ×2 IMPLANT
STENT LIFESTREAM 8X37X80 (Permanent Stent) ×2 IMPLANT
STENT LIFESTREAM 9X38X80 (Permanent Stent) ×2 IMPLANT
STENT VIABAHN 6X100X120 (Permanent Stent) ×2 IMPLANT
STENT VIABAHN 8X100X120 (Permanent Stent) ×1 IMPLANT
STENT VIABAHN 8X10X120 (Permanent Stent) ×1 IMPLANT
SYR MEDRAD MARK 7 150ML (SYRINGE) ×2 IMPLANT
TUBING CONTRAST HIGH PRESS 72 (TUBING) ×2 IMPLANT
WIRE G V18X300CM (WIRE) ×2 IMPLANT
WIRE GUIDERIGHT .035X150 (WIRE) ×2 IMPLANT

## 2020-12-16 NOTE — Interval H&P Note (Signed)
History and Physical Interval Note:  12/16/2020 8:08 AM  Gloria Grimes  has presented today for surgery, with the diagnosis of RLE Angio   BARD   ASO w rest pain.  The various methods of treatment have been discussed with the patient and family. After consideration of risks, benefits and other options for treatment, the patient has consented to  Procedure(s): LOWER EXTREMITY ANGIOGRAPHY (Right) as a surgical intervention.  The patient's history has been reviewed, patient examined, no change in status, stable for surgery.  I have reviewed the patient's chart and labs.  Questions were answered to the patient's satisfaction.     Leotis Pain

## 2020-12-16 NOTE — Op Note (Signed)
[facility]  Percutaneous Study/Intervention Procedural Note   Date of Surgery: 12/16/2020  Surgeon(s):Mathews Stuhr    Assistants:none  Pre-operative Diagnosis: PAD with rest pain right lower extremity  Post-operative diagnosis:  Same  Procedure(s) Performed:             1.  Ultrasound guidance for vascular access left femoral artery             2.  Catheter placement into right common femoral artery from left femoral approach             3.  Aortogram and selective right lower extremity angiogram             4.  Percutaneous transluminal angioplasty of left external iliac artery with 6 mm diameter by 6 cm length Lutonix drug-coated angioplasty balloon             5.  Percutaneous transluminal angioplasty of right SFA and proximal popliteal artery with 4 mm diameter by 30 cm length Lutonix drug-coated angioplasty balloon  6.  Stent placement to the right distal SFA and above-knee popliteal artery with 6 mm diameter by 10 cm length Viabahn stent  7.  Stent placement to the proximal right SFA with 6 mm diameter by 10 cm length life stent  8.  Stent placement to the right external iliac artery with 8 mm diameter by 10 cm length Viabahn stent  9.  Stent placement x2 to the right common iliac artery with 8 mm diameter by 37 mm length lifestream stent and 9 mm diameter by 37 mm length lifestream stent             10.  StarClose closure device left femoral artery  EBL: 20 cc  Contrast: 85 cc  Fluoro Time: 12.9 minutes  Moderate Conscious Sedation Time: approximately 78 minutes using 2 mg of Versed and 100 mcg of Fentanyl              Indications:  Patient is a 87 y.o.female with severe peripheral arterial disease bilaterally with rest pain on the right. The patient has noninvasive study showing a right ABI of 0.1 and a left ABI of 0.5. The patient is brought in for angiography for further evaluation and potential treatment.  Due to the limb threatening nature of the  situation, angiogram was performed for attempted limb salvage. The patient is aware that if the procedure fails, amputation would be expected.  The patient also understands that even with successful revascularization, amputation may still be required due to the severity of the situation.  Risks and benefits are discussed and informed consent is obtained.   Procedure:  The patient was identified and appropriate procedural time out was performed.  The patient was then placed supine on the table and prepped and draped in the usual sterile fashion. Moderate conscious sedation was administered during a face to face encounter with the patient throughout the procedure with my supervision of the RN administering medicines and monitoring the patient's vital signs, pulse oximetry, telemetry and mental status throughout from the start of the procedure until the patient was taken to the recovery room. Ultrasound was used to evaluate the left common femoral artery.  It was patent .  A digital ultrasound image was acquired.  A Seldinger needle was used to access the left common femoral artery under direct ultrasound guidance and a permanent image was performed.  A 0.035 J wire was advanced without resistance and a 5Fr sheath was placed.  Pigtail catheter was placed into the aorta and an AP aortogram was performed. This demonstrated normal renal arteries and a relatively normal aorta.  The right common iliac artery occluded after a short knob and was occluded through the external iliac artery down to the common femoral artery where there was reconstitution.  The left common iliac artery was relatively normal, but there was a high-grade stenosis in the 80 to 85% range in the proximal to mid left external iliac artery. I then crossed the aortic bifurcation and advanced to the right femoral head.  This was difficult given the long total occlusion, and required a V S1 catheter and advantage wire.  This was crossed with a mild amount  of difficulty and intraluminal flow in the right common femoral artery was confirmed.  Selective right lower extremity angiogram was then performed. This demonstrated mildly diseased right common femoral artery and profunda femoris artery.  The SFA was diffusely diseased with a small string of flow and multiple areas of occlusion or high-grade stenosis down to Hunter's canal.  The popliteal artery normalized in the midsegment.  There was then two-vessel runoff with the posterior tibial artery being the dominant runoff to the foot but the anterior tibial artery also being patent. It was felt that it was in the patient's best interest to proceed with intervention after these images to avoid a second procedure and a larger amount of contrast and fluoroscopy based off of the findings from the initial angiogram. The patient was systemically heparinized and prior to treating the right leg, the stenosis in the left external iliac artery was addressed.  A 6 mm diameter by 6 cm length Lutonix drug-coated angioplasty balloon was inflated to 8 atm for 1 minute.  Completion imaging following this showed only about a 15 to 20% residual stenosis in the left external iliac artery.  I predilated the right common and external iliac arteries with 5 mm diameter Lutonix drug-coated balloon before placement of the sheath and as well.  A 6 French Ansell sheath was then placed over the [employer] wire. I then used a Kumpe catheter and the advantage wire to navigate through the SFA disease and confirm intraluminal flow in the below-knee popliteal artery.  I then exchanged for a V 18 wire.  A 4 mm diameter by 30 cm length Lutonix drug-coated angioplasty balloon was inflated from the above-knee popliteal artery up to the proximal SFA and taken to 12 atm for 1 minute.  The midportion looked quite good after angioplasty, but the proximal and distal areas still had greater than 50% residual stenosis.  For the distal lesion, 6 mm diameter  by 10 cm length Viabahn stent was deployed and postdilated with a 4 mm balloon with excellent angiographic ablation result and less than 10% residual stenosis.  For the proximal SFA, a 6 mm diameter by 10 cm length life stent was deployed and postdilated with a 5 mm diameter Lutonix drug-coated angioplasty balloon with excellent angiographic completion result and less than 10% residual stenosis.  I then turned my attention to the iliac lesions.  We upsized to a 7 [place] sheath.  The right external iliac artery was treated with an 8 mm diameter by 10 cm length Viabahn stent postdilated with a 7 mm balloon.  The hypogastric artery was occluded so I elected to place 2 more covered stents up to the origin of the right iliac artery to complete the treatment.  A total of 2 lifestream stents were deployed in the  right common iliac artery.  The first was an 8 mm diameter by 37 mm length lifestream stent and the second was a 9 mm diameter by 37 mm length lifestream stent taken to the origin of the right common iliac artery but taking care not to impair flow to the left side.  These inflations were to 12 atm.  Completion imaging showed excellent flow through both the common and external iliac arteries on the right with less than 10% residual stenosis. I elected to terminate the procedure. The sheath was removed and StarClose closure device was deployed in the left femoral artery with excellent hemostatic result. The patient was taken to the recovery room in stable condition having tolerated the procedure well.  Findings:               Aortogram:  This demonstrated normal renal arteries and a relatively normal aorta.  The right common iliac artery occluded after a short knob and was occluded through the external iliac artery down to the common femoral artery where there was reconstitution.  The left common iliac artery was relatively normal, but there was a high-grade stenosis in the 80 to 85% range in the proximal to mid  left external iliac artery             Right lower Extremity: This demonstrated mildly diseased right common femoral artery and profunda femoris artery.  The SFA was diffusely diseased with a small string of flow and multiple areas of occlusion or high-grade stenosis down to Hunter's canal.  The popliteal artery normalized in the midsegment.  There was then two-vessel runoff with the posterior tibial artery being the dominant runoff to the foot but the anterior tibial artery also being patent.   Disposition: Patient was taken to the recovery room in stable condition having tolerated the procedure well.  Complications: None  Leotis Pain 12/16/2020 10:37 AM   This note was created with Dragon Medical transcription system. Any errors in dictation are purely unintentional.

## 2020-12-17 ENCOUNTER — Encounter: Payer: Self-pay | Admitting: Vascular Surgery

## 2020-12-20 ENCOUNTER — Encounter: Payer: Self-pay | Admitting: Vascular Surgery

## 2020-12-23 ENCOUNTER — Telehealth (INDEPENDENT_AMBULATORY_CARE_PROVIDER_SITE_OTHER): Payer: Self-pay

## 2020-12-23 NOTE — Telephone Encounter (Signed)
Patient was made aware with medical advice and verbalized understanding 

## 2020-12-23 NOTE — Telephone Encounter (Signed)
This is not uncommon following angiogram as you now have increased blood flow.  She should wear compression and elevate her lower extremity.  As long as she is not having a cold foot or toe discoloration, she will be fine to continue scheduled follow up on 08/11

## 2020-12-24 ENCOUNTER — Telehealth (INDEPENDENT_AMBULATORY_CARE_PROVIDER_SITE_OTHER): Payer: Self-pay

## 2020-12-24 NOTE — Telephone Encounter (Signed)
That is correct.  We do not treat patients for respiratory issues, their PCP or urgent care is the best option

## 2020-12-24 NOTE — Telephone Encounter (Signed)
Pts daughter called an left a VM on the nurses line about her mom having had a previous procedure and  now in pain . I called the pt back and made her aware of the note from 12/23/20 in the system the pt's daughter made me aware her mom is wheezing I made her aware that she should go to her PCP or the urgent care to be seen an  if she gets worse the ER. Please advise

## 2020-12-31 NOTE — Telephone Encounter (Signed)
The patient is likely having some reperfusion swelling.  It is not uncommon for a patient to have redness, swelling and heat post angiogram.  The patient should be elevating her lower extremity as much as possible.  Compression is also helpful.  If the patient begins having pain in that leg or  coldness of her lower extremities, that is a cause for concern.Otherwise, we can see if we can move the patient up for follow up studies but I know that provider availability is limited next week.

## 2020-12-31 NOTE — Telephone Encounter (Signed)
Patient was made aware with medical recommendations and verbalized understanding 

## 2021-01-03 ENCOUNTER — Telehealth (INDEPENDENT_AMBULATORY_CARE_PROVIDER_SITE_OTHER): Payer: Self-pay | Admitting: Vascular Surgery

## 2021-01-05 NOTE — Telephone Encounter (Signed)
Patients appointment moved to Friday 8/12

## 2021-01-06 ENCOUNTER — Other Ambulatory Visit (INDEPENDENT_AMBULATORY_CARE_PROVIDER_SITE_OTHER): Payer: Self-pay | Admitting: Vascular Surgery

## 2021-01-06 ENCOUNTER — Ambulatory Visit (INDEPENDENT_AMBULATORY_CARE_PROVIDER_SITE_OTHER): Payer: Medicare HMO | Admitting: Nurse Practitioner

## 2021-01-06 ENCOUNTER — Encounter (INDEPENDENT_AMBULATORY_CARE_PROVIDER_SITE_OTHER): Payer: Medicare HMO

## 2021-01-06 DIAGNOSIS — Z95820 Peripheral vascular angioplasty status with implants and grafts: Secondary | ICD-10-CM

## 2021-01-06 DIAGNOSIS — I70221 Atherosclerosis of native arteries of extremities with rest pain, right leg: Secondary | ICD-10-CM

## 2021-01-07 ENCOUNTER — Encounter (INDEPENDENT_AMBULATORY_CARE_PROVIDER_SITE_OTHER): Payer: Self-pay | Admitting: Nurse Practitioner

## 2021-01-07 ENCOUNTER — Ambulatory Visit (INDEPENDENT_AMBULATORY_CARE_PROVIDER_SITE_OTHER): Payer: Medicare HMO | Admitting: Nurse Practitioner

## 2021-01-07 ENCOUNTER — Other Ambulatory Visit: Payer: Self-pay

## 2021-01-07 ENCOUNTER — Ambulatory Visit (INDEPENDENT_AMBULATORY_CARE_PROVIDER_SITE_OTHER): Payer: Medicare HMO

## 2021-01-07 VITALS — BP 135/73 | HR 99 | Resp 16 | Wt 152.8 lb

## 2021-01-07 DIAGNOSIS — Z95820 Peripheral vascular angioplasty status with implants and grafts: Secondary | ICD-10-CM | POA: Diagnosis not present

## 2021-01-07 DIAGNOSIS — I70221 Atherosclerosis of native arteries of extremities with rest pain, right leg: Secondary | ICD-10-CM

## 2021-01-07 DIAGNOSIS — I10 Essential (primary) hypertension: Secondary | ICD-10-CM | POA: Diagnosis not present

## 2021-01-07 DIAGNOSIS — M7989 Other specified soft tissue disorders: Secondary | ICD-10-CM | POA: Diagnosis not present

## 2021-01-08 ENCOUNTER — Encounter (INDEPENDENT_AMBULATORY_CARE_PROVIDER_SITE_OTHER): Payer: Self-pay | Admitting: Nurse Practitioner

## 2021-01-08 NOTE — Progress Notes (Signed)
Subjective:    Patient ID: Gloria Grimes, female    DOB: 06-17-1930, 87 y.o.   MRN: [id number] Chief Complaint  Patient presents with   Follow-up    [facility] 3wk post le angio    Gloria Grimes is an 87 year old female that returns to the office for followup and review status post angiogram with intervention. The patient notes improvement in the lower extremity symptoms. No interval shortening of the patient's claudication distance or rest pain symptoms. Previous wounds have now healed.  No new ulcers or wounds have occurred since the last visit.  The patient underwent intervention including: Procedure(s) Performed:             1.  Ultrasound guidance for vascular access left femoral artery             2.  Catheter placement into right common femoral artery from left femoral approach             3.  Aortogram and selective right lower extremity angiogram             4.  Percutaneous transluminal angioplasty of left external iliac artery with 6 mm diameter by 6 cm length Lutonix drug-coated angioplasty balloon             5.  Percutaneous transluminal angioplasty of right SFA and proximal popliteal artery with 4 mm diameter by 30 cm length Lutonix drug-coated angioplasty balloon             6.  Stent placement to the right distal SFA and above-knee popliteal artery with 6 mm diameter by 10 cm length Viabahn stent             7.  Stent placement to the proximal right SFA with 6 mm diameter by 10 cm length life stent             8.  Stent placement to the right external iliac artery with 8 mm diameter by 10 cm length Viabahn stent             9.  Stent placement x2 to the right common iliac artery with 8 mm diameter by 37 mm length lifestream stent and 9 mm diameter by 37 mm length lifestream stent             10.  StarClose closure device left femoral artery  Following the intervention the patient has had extensive reperfusion swelling.  Despite conservative tactics the swelling has not improved  and this is distressing for her.   The patient denies amaurosis fugax or recent TIA symptoms. There are no recent neurological changes noted. The patient denies history of DVT, PE or superficial thrombophlebitis. The patient denies recent episodes of angina or shortness of breath.   ABI's Rt=0.93 and Lt=0.39  (previous ABI's Rt=0.12 and Lt=0.51) Duplex US of the bilateral tibial arteries reveals monophasic waveforms.  The right toe waveforms are strong whereas the left lower somewhat dampened.   Review of Systems  Cardiovascular:  Positive for leg swelling.  Skin:  Negative for wound.      Objective:   Physical Exam Vitals reviewed.  HENT:     Head: Normocephalic.  Cardiovascular:     Rate and Rhythm: Normal rate.     Pulses:          Dorsalis pedis pulses are 1+ on the right side and detected w/ Doppler on the left side.       Posterior tibial pulses are detected w/  Doppler on the left side.  Pulmonary:     Effort: Pulmonary effort is normal.  Musculoskeletal:     Right lower leg: 3+ Edema present.  Neurological:     Mental Status: She is alert and oriented to person, place, and time.  Psychiatric:        Mood and Affect: Mood normal.        Behavior: Behavior normal.        Thought Content: Thought content normal.        Judgment: Judgment normal.    BP 135/73 (BP Location: Right Arm)   Pulse 99   Resp 16   Wt 152 lb 12.8 oz (69.3 kg)   BMI 27.95 kg/m   Past Medical History:  Diagnosis Date   Cancer ([place])    ovarian   CHF (congestive heart failure) ([place])     Social History   Socioeconomic History   Marital status: Widowed    Spouse name: Not on file   Number of children: Not on file   Years of education: Not on file   Highest education level: Not on file  Occupational History   Occupation: retired  Tobacco Use   Smoking status: Former   Smokeless tobacco: Never  Substance and Sexual Activity   Alcohol use: No    Comment: wine   Drug use: No    Sexual activity: Never    Birth control/protection: None  Other Topics Concern   Not on file  Social History Narrative   Not on file   Social Determinants of Health   Financial Resource Strain: Not on file  Food Insecurity: Not on file  Transportation Needs: Not on file  Physical Activity: Not on file  Stress: Not on file  Social Connections: Not on file  Intimate Partner Violence: Not on file    Past Surgical History:  Procedure Laterality Date   ABDOMINAL HYSTERECTOMY     LOWER EXTREMITY ANGIOGRAPHY Right 12/16/2020   Procedure: [place];  Surgeon: Algernon Huxley, MD;  Location: [facility];  Service: Cardiovascular;  Laterality: Right;    History reviewed. No pertinent family history.  Allergies  Allergen Reactions   Azithromycin Other (See Comments)   Clindamycin Hcl Other (See Comments)   Demeclocycline Other (See Comments)   Erythromycin Ethylsuccinate Other (See Comments)   Penicillins    Shellfish Allergy Other (See Comments)   Triamcinolone Acetonide Other (See Comments)    CBC Latest Ref Rng & Units 03/08/2016  WBC 3.6 - 11.0 K/uL 11.5(H)  Hemoglobin 12.0 - 16.0 g/dL 12.7  Hematocrit 35.0 - 47.0 % 37.0  Platelets 150 - 440 K/uL 180      CMP     Component Value Date/Time   NA 140 03/10/2016 0320   K 3.6 03/10/2016 0320   CL 99 (L) 03/10/2016 0320   CO2 35 (H) 03/10/2016 0320   GLUCOSE 135 (H) 03/10/2016 0320   BUN 15 12/16/2020 0854   CREATININE 0.52 12/16/2020 0854   CALCIUM 9.1 03/10/2016 0320   GFRNONAA >60 12/16/2020 0854   GFRAA >60 03/10/2016 0320     VAS [place] ABI WITH/WO TBI  Result Date: 12/14/2020  LOWER EXTREMITY DOPPLER STUDY Patient Name:  Gloria Grimes  Date of Exam:   12/13/2020 Medical Rec #: [id number]       Accession #:    [id number] Date of Birth: 1930/12/11       Patient Gender: F Patient Age:   51Y Exam Location:  [facility] Procedure:      VAS [place] ABI WITH/WO TBI Referring Phys: [id number]  [place] --------------------------------------------------------------------------------  Indications: Claudication, and peripheral artery disease.  Performing Technologist: Almira Coaster RVS  Examination Guidelines: A complete evaluation includes at minimum, Doppler waveform signals and systolic blood pressure reading at the level of bilateral brachial, anterior tibial, and posterior tibial arteries, when vessel segments are accessible. Bilateral testing is considered an integral part of a complete examination. Photoelectric Plethysmograph (PPG) waveforms and toe systolic pressure readings are included as required and additional duplex testing as needed. Limited examinations for reoccurring indications may be performed as noted.  ABI Findings: +---------+------------------+-----+-------------------+--------+ Right    Rt Pressure (mmHg)IndexWaveform           Comment  +---------+------------------+-----+-------------------+--------+ Brachial 183                                                +---------+------------------+-----+-------------------+--------+ ATA      22                0.12 dampened monophasic         +---------+------------------+-----+-------------------+--------+ PTA      23                0.12 dampened monophasic         +---------+------------------+-----+-------------------+--------+ Great Toe0                 0.00 Absent                      +---------+------------------+-----+-------------------+--------+ +---------+------------------+-----+--------+-------+ Left     Lt Pressure (mmHg)IndexWaveformComment +---------+------------------+-----+--------+-------+ Brachial 185                                    +---------+------------------+-----+--------+-------+ ATA      82                0.44                 +---------+------------------+-----+--------+-------+ PTA      94                0.51                  +---------+------------------+-----+--------+-------+ Great Toe79                0.43 Abnormal        +---------+------------------+-----+--------+-------+ +-------+-----------+-----------+------------+------------+ ABI/TBIToday's ABIToday's TBIPrevious ABIPrevious TBI +-------+-----------+-----------+------------+------------+ Right  .12        0.0                                 +-------+-----------+-----------+------------+------------+ Left   .51        .43                                 +-------+-----------+-----------+------------+------------+  Summary: Right: Resting right ankle-brachial index indicates critical limb ischemia. The right toe-brachial index is abnormal. Imaged the Right PTA and ATA; Dampened flow seen in both vessels. Left: Resting left ankle-brachial index indicates moderate left lower extremity arterial disease. The left toe-brachial index is abnormal.  *See table(s) above for measurements and  observations.  Electronically signed by Leotis Pain MD on 12/14/2020 at 1:32:09 PM.    Final        Assessment & Plan:   1. Atherosclerosis of native artery of right lower extremity with rest pain [facility]) Recommend:  The patient is status post successful angiogram with intervention.  The patient reports that she never had pain or issues previously but the wound following her ingrown toenail procedure has healed.  The patient's greatest concern now is with the swelling that has occurred following reperfusion  Based on the noninvasive studies today the left lower extremity likely will need intervention in at some point in time.  Currently we do not have limb threatening symptoms and the swelling is of a greater concern for the patient.  Once we get her swelling under control mild we can readdress the left lower extremity.  No further invasive studies, angiography or surgery at this time The patient should continue walking and begin a more formal exercise program.  The  patient should continue antiplatelet therapy and aggressive treatment of the lipid abnormalities  2. Essential hypertension Continue antihypertensive medications as already ordered, these medications have been reviewed and there are no changes at this time.   3. Leg swelling The patient has extensive reperfusion swelling following right lower extremity angiogram.  Despite conservative tactics of elevation and activity the patient continues to have severe swelling.  We will place the patient in [place] wrap.  We will change the wrap weekly in office.  The patient is advised that this rash should remain in place and he should not be with.  The patient is advised to remove it either Thursday night or Friday morning prior to her follow-up appointment so that she is unable to shower.  She will have a reevaluation in 4 weeks to determine the progress of swelling.   Current Outpatient Medications on File Prior to Visit  Medication Sig Dispense Refill   ascorbic acid (VITAMIN C) 500 MG tablet Take 500 mg by mouth daily.     aspirin EC 81 MG tablet Take 81 mg by mouth daily. Swallow whole.     atorvastatin (LIPITOR) 10 MG tablet Take 1 tablet (10 mg total) by mouth daily. 30 tablet 11   carvedilol (COREG) 6.25 MG tablet Take 6.25 mg by mouth 2 (two) times daily with a meal.     clopidogrel (PLAVIX) 75 MG tablet Take 1 tablet (75 mg total) by mouth daily. 30 tablet 11   furosemide (LASIX) 40 MG tablet Take 40 mg by mouth daily.     Lactobacillus-Inulin ([place]) CAPS Take by mouth.     guaiFENesin (MUCINEX) 600 MG 12 hr tablet Take 1 tablet (600 mg total) by mouth 2 (two) times daily. (Patient not taking: No sig reported) 30 tablet 0   No current facility-administered medications on file prior to visit.    There are no Patient Instructions on file for this visit. No follow-ups on file.   Kris Hartmann, NP

## 2021-01-10 ENCOUNTER — Encounter (INDEPENDENT_AMBULATORY_CARE_PROVIDER_SITE_OTHER): Payer: Medicare HMO

## 2021-01-10 ENCOUNTER — Ambulatory Visit (INDEPENDENT_AMBULATORY_CARE_PROVIDER_SITE_OTHER): Payer: Medicare HMO | Admitting: Nurse Practitioner

## 2021-01-14 ENCOUNTER — Other Ambulatory Visit: Payer: Self-pay

## 2021-01-14 ENCOUNTER — Ambulatory Visit (INDEPENDENT_AMBULATORY_CARE_PROVIDER_SITE_OTHER): Payer: Medicare HMO | Admitting: Nurse Practitioner

## 2021-01-14 VITALS — BP 137/75 | HR 94 | Ht 62.0 in | Wt 151.0 lb

## 2021-01-14 DIAGNOSIS — M7989 Other specified soft tissue disorders: Secondary | ICD-10-CM

## 2021-01-14 DIAGNOSIS — R600 Localized edema: Secondary | ICD-10-CM | POA: Diagnosis not present

## 2021-01-14 NOTE — Progress Notes (Signed)
History of Present Illness  There is no documented history at this time  Assessments & Plan   There are no diagnoses linked to this encounter.    Additional instructions  Subjective:  Patient presents with venous ulcer of the Right lower extremity.    Procedure:  3 layer unna wrap was placed Right lower extremity.   Plan:   Follow up in one week.   

## 2021-01-15 ENCOUNTER — Encounter (INDEPENDENT_AMBULATORY_CARE_PROVIDER_SITE_OTHER): Payer: Self-pay | Admitting: Nurse Practitioner

## 2021-01-21 ENCOUNTER — Telehealth (INDEPENDENT_AMBULATORY_CARE_PROVIDER_SITE_OTHER): Payer: Self-pay | Admitting: Nurse Practitioner

## 2021-01-21 ENCOUNTER — Other Ambulatory Visit: Payer: Self-pay

## 2021-01-21 ENCOUNTER — Encounter (INDEPENDENT_AMBULATORY_CARE_PROVIDER_SITE_OTHER): Payer: Self-pay

## 2021-01-21 ENCOUNTER — Ambulatory Visit (INDEPENDENT_AMBULATORY_CARE_PROVIDER_SITE_OTHER): Payer: Medicare HMO | Admitting: Nurse Practitioner

## 2021-01-21 VITALS — BP 138/77 | HR 89 | Resp 16 | Wt 151.8 lb

## 2021-01-21 DIAGNOSIS — I83019 Varicose veins of right lower extremity with ulcer of unspecified site: Secondary | ICD-10-CM | POA: Diagnosis not present

## 2021-01-21 DIAGNOSIS — I70221 Atherosclerosis of native arteries of extremities with rest pain, right leg: Secondary | ICD-10-CM

## 2021-01-21 NOTE — Progress Notes (Signed)
History of Present Illness  There is no documented history at this time  Assessments & Plan   There are no diagnoses linked to this encounter.    Additional instructions  Subjective:  Patient presents with venous ulcer of the Right lower extremity.    Procedure:  3 layer unna wrap was placed Right lower extremity.   Plan:   Follow up in one week.   

## 2021-01-21 NOTE — Telephone Encounter (Signed)
Left message on patient voicemail to return call about record request

## 2021-01-21 NOTE — Telephone Encounter (Signed)
Patient was seen in office today 01/21/21 for an unna boot change. Patient requested that we send her medical records to Dr. Ubaldo Glassing (Cardiologist) and her PCP Dr. Kary Kos.  [facility] fax#: [phone] fax#: [phone]  I spoke briefly with April and she advised me that she can send records through [facility].

## 2021-01-22 ENCOUNTER — Encounter (INDEPENDENT_AMBULATORY_CARE_PROVIDER_SITE_OTHER): Payer: Self-pay | Admitting: Nurse Practitioner

## 2021-01-28 ENCOUNTER — Other Ambulatory Visit: Payer: Self-pay

## 2021-01-28 ENCOUNTER — Encounter (INDEPENDENT_AMBULATORY_CARE_PROVIDER_SITE_OTHER): Payer: Self-pay | Admitting: Nurse Practitioner

## 2021-01-28 ENCOUNTER — Ambulatory Visit (INDEPENDENT_AMBULATORY_CARE_PROVIDER_SITE_OTHER): Payer: Medicare HMO | Admitting: Nurse Practitioner

## 2021-01-28 VITALS — BP 123/73 | HR 92 | Ht 62.0 in | Wt 151.0 lb

## 2021-01-28 DIAGNOSIS — R600 Localized edema: Secondary | ICD-10-CM | POA: Diagnosis not present

## 2021-01-28 DIAGNOSIS — M7989 Other specified soft tissue disorders: Secondary | ICD-10-CM

## 2021-01-28 NOTE — Progress Notes (Signed)
History of Present Illness  There is no documented history at this time  Assessments & Plan   There are no diagnoses linked to this encounter.    Additional instructions  Subjective:  Patient presents with venous ulcer of the Right lower extremity.    Procedure:  3 layer unna wrap was placed Right lower extremity.   Plan:   Follow up in one week.   

## 2021-02-04 ENCOUNTER — Ambulatory Visit (INDEPENDENT_AMBULATORY_CARE_PROVIDER_SITE_OTHER): Payer: Medicare HMO | Admitting: Vascular Surgery

## 2021-02-04 ENCOUNTER — Other Ambulatory Visit: Payer: Self-pay

## 2021-02-04 ENCOUNTER — Ambulatory Visit (INDEPENDENT_AMBULATORY_CARE_PROVIDER_SITE_OTHER): Payer: Medicare HMO | Admitting: Nurse Practitioner

## 2021-02-04 VITALS — BP 157/76 | HR 80 | Ht 62.0 in | Wt 149.0 lb

## 2021-02-04 DIAGNOSIS — R600 Localized edema: Secondary | ICD-10-CM | POA: Diagnosis not present

## 2021-02-04 DIAGNOSIS — M7989 Other specified soft tissue disorders: Secondary | ICD-10-CM

## 2021-02-04 NOTE — Progress Notes (Signed)
History of Present Illness  There is no documented history at this time  Assessments & Plan   There are no diagnoses linked to this encounter.    Additional instructions  Subjective:  Patient presents with venous ulcer of the  lower extremity.  Right  Procedure:  3 layer unna wrap was placed Right lower extremity.   Plan:   Follow up in one week.

## 2021-02-05 ENCOUNTER — Encounter (INDEPENDENT_AMBULATORY_CARE_PROVIDER_SITE_OTHER): Payer: Self-pay | Admitting: Nurse Practitioner

## 2021-02-08 ENCOUNTER — Ambulatory Visit (INDEPENDENT_AMBULATORY_CARE_PROVIDER_SITE_OTHER): Payer: Medicare HMO | Admitting: Vascular Surgery

## 2021-02-09 ENCOUNTER — Other Ambulatory Visit: Payer: Self-pay

## 2021-02-09 ENCOUNTER — Encounter (INDEPENDENT_AMBULATORY_CARE_PROVIDER_SITE_OTHER): Payer: Self-pay | Admitting: Nurse Practitioner

## 2021-02-09 ENCOUNTER — Ambulatory Visit (INDEPENDENT_AMBULATORY_CARE_PROVIDER_SITE_OTHER): Payer: Medicare HMO | Admitting: Nurse Practitioner

## 2021-02-09 VITALS — BP 132/74 | HR 84 | Resp 16 | Wt 148.0 lb

## 2021-02-09 DIAGNOSIS — M7989 Other specified soft tissue disorders: Secondary | ICD-10-CM | POA: Diagnosis not present

## 2021-02-09 DIAGNOSIS — I70221 Atherosclerosis of native arteries of extremities with rest pain, right leg: Secondary | ICD-10-CM

## 2021-02-09 DIAGNOSIS — I10 Essential (primary) hypertension: Secondary | ICD-10-CM

## 2021-02-14 ENCOUNTER — Encounter (INDEPENDENT_AMBULATORY_CARE_PROVIDER_SITE_OTHER): Payer: Self-pay | Admitting: Nurse Practitioner

## 2021-02-14 NOTE — Progress Notes (Signed)
Subjective:    Patient ID: Gloria Grimes, female    DOB: [birth year], 87 y.o.   MRN: [id number] Chief Complaint  Patient presents with   Follow-up    4 wk right unna boot    Gloria Grimes is a 87 year old female that returns today for evaluation of lower extremity edema after significant reperfusion pain after a right lower extremity angiogram.  Today the swelling is drastically reduced.  The patient notes that her leg feels much better.  She denies any new wounds or ulcerations.  She denies any rest pain or worsening claudication.  Overall she is very happy with the decrease in swelling.   Review of Systems  Cardiovascular:  Positive for leg swelling.  All other systems reviewed and are negative.     Objective:   Physical Exam Vitals reviewed.  HENT:     Head: Normocephalic.  Cardiovascular:     Rate and Rhythm: Normal rate.  Pulmonary:     Effort: Pulmonary effort is normal.  Musculoskeletal:     Right lower leg: 1+ Edema present.  Neurological:     Mental Status: She is alert and oriented to person, place, and time.  Psychiatric:        Mood and Affect: Mood normal.        Behavior: Behavior normal.        Thought Content: Thought content normal.        Judgment: Judgment normal.    BP 132/74 (BP Location: Right Arm)   Pulse 84   Resp 16   Wt 148 lb (67.1 kg)   BMI 27.07 kg/m   Past Medical History:  Diagnosis Date   Cancer ([place])    ovarian   CHF (congestive heart failure) ([place])     Social History   Socioeconomic History   Marital status: Widowed    Spouse name: Not on file   Number of children: Not on file   Years of education: Not on file   Highest education level: Not on file  Occupational History   Occupation: retired  Tobacco Use   Smoking status: Former   Smokeless tobacco: Never  Substance and Sexual Activity   Alcohol use: No    Comment: wine   Drug use: No   Sexual activity: Never    Birth control/protection: None  Other Topics  Concern   Not on file  Social History Narrative   Not on file   Social Determinants of Health   Financial Resource Strain: Not on file  Food Insecurity: Not on file  Transportation Needs: Not on file  Physical Activity: Not on file  Stress: Not on file  Social Connections: Not on file  Intimate Partner Violence: Not on file    Past Surgical History:  Procedure Laterality Date   ABDOMINAL HYSTERECTOMY     LOWER EXTREMITY ANGIOGRAPHY Right 12/16/2020   Procedure: [place];  Surgeon: Algernon Huxley, MD;  Location: [facility];  Service: Cardiovascular;  Laterality: Right;    History reviewed. No pertinent family history.  Allergies  Allergen Reactions   Azithromycin Other (See Comments)   Clindamycin Hcl Other (See Comments)   Demeclocycline Other (See Comments)   Erythromycin Ethylsuccinate Other (See Comments)   Penicillins    Shellfish Allergy Other (See Comments)   Triamcinolone Acetonide Other (See Comments)    CBC Latest Ref Rng & Units 03/08/2016  WBC 3.6 - 11.0 K/uL 11.5(H)  Hemoglobin 12.0 - 16.0 g/dL 12.7  Hematocrit 35.0 -  47.0 % 37.0  Platelets 150 - 440 K/uL 180      CMP     Component Value Date/Time   NA 140 03/10/2016 0320   K 3.6 03/10/2016 0320   CL 99 (L) 03/10/2016 0320   CO2 35 (H) 03/10/2016 0320   GLUCOSE 135 (H) 03/10/2016 0320   BUN 15 12/16/2020 0854   CREATININE 0.52 12/16/2020 0854   CALCIUM 9.1 03/10/2016 0320   GFRNONAA >60 12/16/2020 0854   GFRAA >60 03/10/2016 0320     VAS [place] ABI WITH/WO TBI  Result Date: 01/14/2021  LOWER EXTREMITY DOPPLER STUDY Patient Name:  Gloria Grimes  Date of Exam:   01/07/2021 Medical Rec #: [id number]       Accession #:    [id number] Date of Birth: 07/17/1930       Patient Gender: F Patient Age:   62 years Exam Location:  [facility] Procedure:      VAS [place] ABI WITH/WO TBI Referring Phys: Leotis Pain  --------------------------------------------------------------------------------  Indications: Claudication, and peripheral artery disease.  Comparison Study: 12/13/2020 Performing Technologist: Charlane Ferretti RT (R)(VS)  Examination Guidelines: A complete evaluation includes at minimum, Doppler waveform signals and systolic blood pressure reading at the level of bilateral brachial, anterior tibial, and posterior tibial arteries, when vessel segments are accessible. Bilateral testing is considered an integral part of a complete examination. Photoelectric Plethysmograph (PPG) waveforms and toe systolic pressure readings are included as required and additional duplex testing as needed. Limited examinations for reoccurring indications may be performed as noted.  ABI Findings: +---------+------------------+-----+----------+--------+ Right    Rt Pressure (mmHg)IndexWaveform  Comment  +---------+------------------+-----+----------+--------+ Brachial 161                                       +---------+------------------+-----+----------+--------+ ATA      143                    monophasic         +---------+------------------+-----+----------+--------+ PTA      150               0.93 monophasic         +---------+------------------+-----+----------+--------+ Great Toe142               0.88 Abnormal           +---------+------------------+-----+----------+--------+ +---------+------------------+-----+----------+-------+ Left     Lt Pressure (mmHg)IndexWaveform  Comment +---------+------------------+-----+----------+-------+ Brachial 156                                      +---------+------------------+-----+----------+-------+ ATA      60                     monophasic        +---------+------------------+-----+----------+-------+ PTA      63                0.39 monophasic        +---------+------------------+-----+----------+-------+ Great Toe48                0.30  Abnormal          +---------+------------------+-----+----------+-------+ +-------+-----------+-----------+------------+------------+ ABI/TBIToday's ABIToday's TBIPrevious ABIPrevious TBI +-------+-----------+-----------+------------+------------+ Right  .93        .88        .12  0            +-------+-----------+-----------+------------+------------+ Left   .39        .30        .51         .43          +-------+-----------+-----------+------------+------------+ Right ABIs appear increased compared to prior study on 12/13/2020. Left ABIs appear essentially unchanged compared to prior study on 12/13/2020.  Summary: Right: Resting right ankle-brachial index indicates mild right lower extremity arterial disease. The right toe-brachial index is normal. Right TBI appears increased as compared to the previous examon 12/13/2020. Left: Resting left ankle-brachial index indicates severe left lower extremity arterial disease. The left toe-brachial index is abnormal. Left TBI appears decreased as compared to the previous exam on 12/13/2020.  *See table(s) above for measurements and observations.  Electronically signed by Leotis Pain MD on 01/14/2021 at 9:23:39 AM.    Final        Assessment & Plan:   1. Atherosclerosis of native artery of right lower extremity with rest pain [facility]) The patient is still currently stable.  There is no signs symptoms of worsening PAD currently.  We will have the patient return to the office in 2 months for noninvasive studies.  The patient's left lower extremity did have decreased ABIs and intervention may be necessary at that time.  2. Leg swelling The patient has been in [place] wraps and her leg swelling is drastically reduced.  The patient is advised to continue with the use of medical grade compression stockings as well as elevation.  3. Essential hypertension Continue antihypertensive medications as already ordered, these medications have been reviewed and  there are no changes at this time.    Current Outpatient Medications on File Prior to Visit  Medication Sig Dispense Refill   amoxicillin (AMOXIL) 500 MG capsule Take 500 mg by mouth as directed. Take a 1 hour before dental procedure ( teeth cleaning)     ascorbic acid (VITAMIN C) 500 MG tablet Take 500 mg by mouth daily.     aspirin EC 81 MG tablet Take 81 mg by mouth daily. Swallow whole.     atorvastatin (LIPITOR) 10 MG tablet Take 1 tablet (10 mg total) by mouth daily. 30 tablet 11   carvedilol (COREG) 6.25 MG tablet Take 6.25 mg by mouth 2 (two) times daily with a meal.     cetirizine (ZYRTEC) 10 MG tablet Take 10 mg by mouth daily.     clopidogrel (PLAVIX) 75 MG tablet Take 1 tablet (75 mg total) by mouth daily. 30 tablet 11   furosemide (LASIX) 40 MG tablet Take 40 mg by mouth daily.     Lactobacillus-Inulin ([place]) CAPS Take by mouth.     guaiFENesin (MUCINEX) 600 MG 12 hr tablet Take 1 tablet (600 mg total) by mouth 2 (two) times daily. (Patient not taking: No sig reported) 30 tablet 0   No current facility-administered medications on file prior to visit.    There are no Patient Instructions on file for this visit. No follow-ups on file.   Kris Hartmann, NP

## 2021-03-02 ENCOUNTER — Telehealth (INDEPENDENT_AMBULATORY_CARE_PROVIDER_SITE_OTHER): Payer: Self-pay

## 2021-03-02 NOTE — Telephone Encounter (Signed)
Patient was made aware with medical advice and verbalized understanding. Patient informed that she has small place on her right leg but she is placing a band aid over the area to prevent bleeding before putting compression stockings on.

## 2021-03-02 NOTE — Telephone Encounter (Signed)
Our previous discussion involved mentioning that Gloria Grimes needs to stay on her blood thinners for for 3 months following her intervention.  Her intervention was done on 12/16/2020.  Based on that Gloria Grimes cannot stop any blood thinner until at least 03/18/2021.  This is because if Gloria Grimes stops her blood thinner prior to 3 months, there is an increased risk that Gloria Grimes will clot her stents that were placed.  Therefore typically unless the patient is having a major bleeding episode we will not hold her anticoagulation until this 39-month process has passed.  As far as the shots Gloria Grimes has no issues taking the shots currently.  If the patient is having concerns with bleeding of her gums Gloria Grimes may want to visit her dentist for evaluation.

## 2021-03-10 ENCOUNTER — Telehealth (INDEPENDENT_AMBULATORY_CARE_PROVIDER_SITE_OTHER): Payer: Self-pay

## 2021-03-10 NOTE — Telephone Encounter (Signed)
Pt called and left a VM on the nurses line saying she want to know could she stop taking her blood thinner she is red and blue under her right eye. Please advise.

## 2021-03-10 NOTE — Telephone Encounter (Signed)
The patient can stop her aspirin.  She needs to remain on plavix

## 2021-03-15 NOTE — Telephone Encounter (Signed)
I called the pt and made her aware on her VM about the NP's instructions.

## 2021-04-15 ENCOUNTER — Ambulatory Visit (INDEPENDENT_AMBULATORY_CARE_PROVIDER_SITE_OTHER): Payer: Medicare HMO | Admitting: Vascular Surgery

## 2021-04-15 ENCOUNTER — Other Ambulatory Visit: Payer: Self-pay

## 2021-04-15 ENCOUNTER — Encounter (INDEPENDENT_AMBULATORY_CARE_PROVIDER_SITE_OTHER): Payer: Self-pay | Admitting: Vascular Surgery

## 2021-04-15 VITALS — BP 142/75 | HR 78 | Resp 16 | Wt 142.2 lb

## 2021-04-15 DIAGNOSIS — I10 Essential (primary) hypertension: Secondary | ICD-10-CM

## 2021-04-15 DIAGNOSIS — I70221 Atherosclerosis of native arteries of extremities with rest pain, right leg: Secondary | ICD-10-CM

## 2021-04-15 NOTE — Assessment & Plan Note (Signed)
blood pressure control important in reducing the progression of atherosclerotic disease. On appropriate oral medications.  

## 2021-04-15 NOTE — Progress Notes (Signed)
MRN : [id number]  Gloria Grimes is a 87 y.o. ([birth year]) female who presents with chief complaint of  Chief Complaint  Patient presents with   Follow-up    2 month follow up  .  History of Present Illness: Patient returns today in follow up of PAD.  About 4 months ago the patient underwent an extensive right lower extremity revascularization for critical limb ischemia with an ABI of 0.1, rest pain, and ulceration.  She had pretty significant reperfusion swelling and pain for a few weeks afterwards, but this has resolved and her leg is doing great.  No current wounds.  No rest pain.  She is walking very well and not relieving having claudication.  Her perfusion was checked a couple of months ago and was found to be back in the normal range.  At that time she was still having some swelling and she follows up but this has essentially resolved at this point.  She is very pleased.  She did stop Plavix after 3 months because of excess bruising as well as blood in and around her eye.  She remains on aspirin.  Current Outpatient Medications  Medication Sig Dispense Refill   amoxicillin (AMOXIL) 500 MG capsule Take 500 mg by mouth as directed. Take a 1 hour before dental procedure ( teeth cleaning)     ascorbic acid (VITAMIN C) 500 MG tablet Take 500 mg by mouth daily.     aspirin EC 81 MG tablet Take 81 mg by mouth daily. Swallow whole.     carvedilol (COREG) 6.25 MG tablet Take 6.25 mg by mouth 2 (two) times daily with a meal.     cetirizine (ZYRTEC) 10 MG tablet Take 10 mg by mouth daily.     furosemide (LASIX) 40 MG tablet Take 40 mg by mouth daily.     Lactobacillus-Inulin ([place]) CAPS Take by mouth.     atorvastatin (LIPITOR) 10 MG tablet Take 1 tablet (10 mg total) by mouth daily. (Patient not taking: Reported on 04/15/2021) 30 tablet 11   clopidogrel (PLAVIX) 75 MG tablet Take 1 tablet (75 mg total) by mouth daily. (Patient not taking: Reported on 04/15/2021) 30 tablet  11   guaiFENesin (MUCINEX) 600 MG 12 hr tablet Take 1 tablet (600 mg total) by mouth 2 (two) times daily. (Patient not taking: Reported on 12/16/2020) 30 tablet 0   No current facility-administered medications for this visit.    Past Medical History:  Diagnosis Date   Cancer ([place])    ovarian   CHF (congestive heart failure) ([place])     Past Surgical History:  Procedure Laterality Date   ABDOMINAL HYSTERECTOMY     LOWER EXTREMITY ANGIOGRAPHY Right 12/16/2020   Procedure: LOWER EXTREMITY ANGIOGRAPHY;  Surgeon: Algernon Huxley, MD;  Location: [facility];  Service: Cardiovascular;  Laterality: Right;     Social History   Tobacco Use   Smoking status: Former   Smokeless tobacco: Never  Substance Use Topics   Alcohol use: No    Comment: wine   Drug use: No      No family history on file.   Allergies  Allergen Reactions   Statins Swelling   Azithromycin Other (See Comments)   Clindamycin Hcl Other (See Comments)   Demeclocycline Other (See Comments)   Erythromycin Ethylsuccinate Other (See Comments)   Penicillins    Shellfish Allergy Other (See Comments)    Other reaction(s): Headache Other reaction(s): Other (See Comments)   Triamcinolone  Acetonide Other (See Comments)    REVIEW OF SYSTEMS (Negative unless checked)   Constitutional: [] Weight loss  [] Fever  [] Chills Cardiac: [] Chest pain   [] Chest pressure   [] Palpitations   [] Shortness of breath when laying flat   [] Shortness of breath at rest   [] Shortness of breath with exertion. Vascular:  [] Pain in legs with walking   [] Pain in legs at rest   [] Pain in legs when laying flat   [] Claudication   [] Pain in feet when walking  [] Pain in feet at rest  [] Pain in feet when laying flat   [] History of DVT   [] Phlebitis   [] Swelling in legs   [] Varicose veins   [] Non-healing ulcers Pulmonary:   [] Uses home oxygen   [] Productive cough   [] Hemoptysis   [] Wheeze  [] COPD   [] Asthma Neurologic:  [] Dizziness  [] Blackouts    [] Seizures   [] History of stroke   [] History of TIA  [] Aphasia   [] Temporary blindness   [] Dysphagia   [] Weakness or numbness in arms   [] Weakness or numbness in legs Musculoskeletal:  [x] Arthritis   [] Joint swelling   [x] Joint pain   [] Low back pain Hematologic:  [] Easy bruising  [] Easy bleeding   [] Hypercoagulable state   [] Anemic  [] Hepatitis Gastrointestinal:  [] Blood in stool   [] Vomiting blood  [] Gastroesophageal reflux/heartburn   [] Abdominal pain Genitourinary:  [] Chronic kidney disease   [] Difficult urination  [] Frequent urination  [] Burning with urination   [] Hematuria Skin:  [] Rashes   [x] Ulcers   [x] Wounds Psychological:  [] History of anxiety   []  History of major depression.    Physical Examination  BP (!) 142/75 (BP Location: Left Arm)   Pulse 78   Resp 16   Wt 142 lb 3.2 oz (64.5 kg)   BMI 26.01 kg/m  Gen:  WD/WN, NAD.  Appears younger than stated age Head: [place]/AT, No temporalis wasting. Ear/Nose/Throat: Hearing grossly intact, nares w/o erythema or drainage Eyes: Conjunctiva clear. Sclera non-icteric Neck: Supple.  Trachea midline Pulmonary:  Good air movement, no use of accessory muscles.  Cardiac: Somewhat irregular Vascular:  Vessel Right Left  Radial Palpable Palpable                          PT 1+ palpable 1+ palpable  DP 2+ palpable 1+ palpable   Gastrointestinal: soft, non-tender/non-distended. No guarding/reflex.  Musculoskeletal: M/S 5/5 throughout.  No deformity or atrophy.  Trace right lower extremity edema. Neurologic: Sensation grossly intact in extremities.  Symmetrical.  Speech is fluent.  Psychiatric: Judgment intact, Mood & affect appropriate for pt's clinical situation. Dermatologic: No rashes or ulcers noted.  No cellulitis or open wounds.      Labs No results found for this or any previous visit (from the past 2160 hour(s)).  Radiology No results found.  Assessment/Plan  Essential hypertension blood pressure control important  in reducing the progression of atherosclerotic disease. On appropriate oral medications.   Atherosclerosis of native arteries of extremity with rest pain ([place]) Symptoms are markedly improved after revascularization.  Her reperfusion symptoms have also abated since her last visit.  She is doing great.  Continue aspirin therapy.  With her extensive revascularization, I would have preferred to keep her on Plavix but the side effects were just too much for her.  We will see her back in 3 months with noninvasive studies.    Leotis Pain, MD  04/15/2021 11:40 AM    This note was created with Dragon medical transcription  system.  Any errors from dictation are purely unintentional

## 2021-04-15 NOTE — Assessment & Plan Note (Signed)
Symptoms are markedly improved after revascularization.  Her reperfusion symptoms have also abated since her last visit.  She is doing great.  Continue aspirin therapy.  With her extensive revascularization, I would have preferred to keep her on Plavix but the side effects were just too much for her.  We will see her back in 3 months with noninvasive studies.

## 2021-07-26 ENCOUNTER — Other Ambulatory Visit: Payer: Self-pay

## 2021-07-26 ENCOUNTER — Ambulatory Visit (INDEPENDENT_AMBULATORY_CARE_PROVIDER_SITE_OTHER): Payer: Medicare HMO

## 2021-07-26 ENCOUNTER — Encounter (INDEPENDENT_AMBULATORY_CARE_PROVIDER_SITE_OTHER): Payer: Self-pay | Admitting: Vascular Surgery

## 2021-07-26 ENCOUNTER — Ambulatory Visit (INDEPENDENT_AMBULATORY_CARE_PROVIDER_SITE_OTHER): Payer: Medicare HMO | Admitting: Vascular Surgery

## 2021-07-26 VITALS — BP 132/79 | HR 92 | Resp 16 | Ht 62.0 in | Wt 145.0 lb

## 2021-07-26 DIAGNOSIS — I70221 Atherosclerosis of native arteries of extremities with rest pain, right leg: Secondary | ICD-10-CM | POA: Diagnosis not present

## 2021-07-26 DIAGNOSIS — I10 Essential (primary) hypertension: Secondary | ICD-10-CM

## 2021-07-26 NOTE — Progress Notes (Signed)
MRN : [id number]  Gloria Grimes is a 88 y.o. (08-27-1930) female who presents with chief complaint of  Chief Complaint  Patient presents with   Follow-up    Ultrasound follow up  .  History of Present Illness: Patient returns today in follow up of her PAD.  She underwent extensive revascularization about 6 or 7 months ago for critical limb ischemia.  She reports she is walking so much better and having essentially no pain at this point.  No ulceration or infection.  Her ABI today has markedly dropped on the right side and is now down to 0.37 with a stable left ABI of 0.48.  Prior to her intervention, it was 0.1 but it had normalized after revascularization.  Current Outpatient Medications  Medication Sig Dispense Refill   amoxicillin (AMOXIL) 500 MG capsule Take 500 mg by mouth as directed. Take a 1 hour before dental procedure ( teeth cleaning)     ascorbic acid (VITAMIN C) 500 MG tablet Take 500 mg by mouth daily.     aspirin EC 81 MG tablet Take 81 mg by mouth daily. Swallow whole.     carvedilol (COREG) 6.25 MG tablet Take 6.25 mg by mouth 2 (two) times daily with a meal.     cetirizine (ZYRTEC) 10 MG tablet Take 10 mg by mouth daily.     furosemide (LASIX) 40 MG tablet Take 40 mg by mouth daily.     Lactobacillus-Inulin ([place]) CAPS Take by mouth.     atorvastatin (LIPITOR) 10 MG tablet Take 1 tablet (10 mg total) by mouth daily. (Patient not taking: Reported on 04/15/2021) 30 tablet 11   clopidogrel (PLAVIX) 75 MG tablet Take 1 tablet (75 mg total) by mouth daily. (Patient not taking: Reported on 04/15/2021) 30 tablet 11   guaiFENesin (MUCINEX) 600 MG 12 hr tablet Take 1 tablet (600 mg total) by mouth 2 (two) times daily. (Patient not taking: Reported on 12/16/2020) 30 tablet 0   No current facility-administered medications for this visit.    Past Medical History:  Diagnosis Date   Cancer ([place])    ovarian   CHF (congestive heart failure) ([place])      Past Surgical History:  Procedure Laterality Date   ABDOMINAL HYSTERECTOMY     LOWER EXTREMITY ANGIOGRAPHY Right 12/16/2020   Procedure: LOWER EXTREMITY ANGIOGRAPHY;  Surgeon: Algernon Huxley, MD;  Location: [facility];  Service: Cardiovascular;  Laterality: Right;     Social History   Tobacco Use   Smoking status: Former   Smokeless tobacco: Never  Substance Use Topics   Alcohol use: No    Comment: wine   Drug use: No      No family history on file.   Allergies  Allergen Reactions   Statins Swelling   Azithromycin Other (See Comments)   Clindamycin Hcl Other (See Comments)   Demeclocycline Other (See Comments)   Erythromycin Ethylsuccinate Other (See Comments)   Penicillins    Shellfish Allergy Other (See Comments)    Other reaction(s): Headache Other reaction(s): Other (See Comments)   Triamcinolone Acetonide Other (See Comments)    REVIEW OF SYSTEMS (Negative unless checked)   Constitutional: [] Weight loss  [] Fever  [] Chills Cardiac: [] Chest pain   [] Chest pressure   [] Palpitations   [] Shortness of breath when laying flat   [] Shortness of breath at rest   [] Shortness of breath with exertion. Vascular:  [] Pain in legs with walking   [] Pain in legs at rest   []   Pain in legs when laying flat   [] Claudication   [] Pain in feet when walking  [] Pain in feet at rest  [] Pain in feet when laying flat   [] History of DVT   [] Phlebitis   [] Swelling in legs   [] Varicose veins   [] Non-healing ulcers Pulmonary:   [] Uses home oxygen   [] Productive cough   [] Hemoptysis   [] Wheeze  [] COPD   [] Asthma Neurologic:  [] Dizziness  [] Blackouts   [] Seizures   [] History of stroke   [] History of TIA  [] Aphasia   [] Temporary blindness   [] Dysphagia   [] Weakness or numbness in arms   [] Weakness or numbness in legs Musculoskeletal:  [x] Arthritis   [] Joint swelling   [x] Joint pain   [] Low back pain Hematologic:  [] Easy bruising  [] Easy bleeding   [] Hypercoagulable state   [] Anemic   [] Hepatitis Gastrointestinal:  [] Blood in stool   [] Vomiting blood  [] Gastroesophageal reflux/heartburn   [] Abdominal pain Genitourinary:  [] Chronic kidney disease   [] Difficult urination  [] Frequent urination  [] Burning with urination   [] Hematuria Skin:  [] Rashes   [x] Ulcers   [x] Wounds Psychological:  [] History of anxiety   []  History of major depression.  Physical Examination  BP 132/79 (BP Location: Left Arm)    Pulse 92    Resp 16    Ht 5\' 2"  (1.575 m)    Wt 145 lb (65.8 kg)    BMI 26.52 kg/m  Gen:  WD/WN, NAD. Appears younger than stated age. Head: [place]/AT, No temporalis wasting. Ear/Nose/Throat: Hearing grossly intact, nares w/o erythema or drainage Eyes: Conjunctiva clear. Sclera non-icteric Neck: Supple.  Trachea midline Pulmonary:  Good air movement, no use of accessory muscles.  Cardiac: irregular Vascular:  Vessel Right Left  Radial Palpable Palpable                          PT Not palpable Trace palpable  DP 1+ palpable 1+ palpable   Gastrointestinal: soft, non-tender/non-distended. No guarding/reflex.  Musculoskeletal: M/S 5/5 throughout.  No deformity or atrophy.  1+ right lower extremity edema, trace left lower extremity edema. Neurologic: Sensation grossly intact in extremities.  Symmetrical.  Speech is fluent.  Psychiatric: Judgment intact, Mood & affect appropriate for pt's clinical situation. Dermatologic: No rashes or ulcers noted.  No cellulitis or open wounds.      Labs No results found for this or any previous visit (from the past 2160 hour(s)).  Radiology No results found.  Assessment/Plan Essential hypertension blood pressure control important in reducing the progression of atherosclerotic disease. On appropriate oral medications.  Atherosclerosis of native arteries of extremity with rest pain ([place]) Her ABI today has markedly dropped on the right side and is now down to 0.37 with a stable left ABI of 0.48.  Prior to her intervention, it was  0.1 but it had normalized after revascularization.  Although she has had a dramatic drop in her ABIs, her clinical symptoms are stable.  It is possible we may make things worse with an attempt at revascularization and I think continuing to monitor on a short interval follow-up at this point is probably the most prudent option.  I have discussed if she develops nonhealing ulceration, rest pain, or disabling claudication symptoms she should contact our office and we would consider revascularization.  Otherwise I will see her in 3 months.    Leotis Pain, MD  07/26/2021 2:35 PM    This note was created with Dragon medical transcription system.  Any  errors from dictation are purely unintentional

## 2021-07-26 NOTE — Assessment & Plan Note (Signed)
Her ABI today has markedly dropped on the right side and is now down to 0.37 with a stable left ABI of 0.48.  Prior to her intervention, it was 0.1 but it had normalized after revascularization.  Although she has had a dramatic drop in her ABIs, her clinical symptoms are stable.  It is possible we may make things worse with an attempt at revascularization and I think continuing to monitor on a short interval follow-up at this point is probably the most prudent option.  I have discussed if she develops nonhealing ulceration, rest pain, or disabling claudication symptoms she should contact our office and we would consider revascularization.  Otherwise I will see her in 3 months.

## 2021-10-21 ENCOUNTER — Telehealth (INDEPENDENT_AMBULATORY_CARE_PROVIDER_SITE_OTHER): Payer: Self-pay | Admitting: Vascular Surgery

## 2021-10-21 NOTE — Telephone Encounter (Signed)
LVM for pt advising that Dr. Lucky Cowboy will be doing emergency surgery on Tuesday during her appt time. We would like her to come in and have the [place] and then we will send her a letter with the results or call if we need to speak with her.

## 2021-10-25 ENCOUNTER — Ambulatory Visit (INDEPENDENT_AMBULATORY_CARE_PROVIDER_SITE_OTHER): Payer: Medicare HMO

## 2021-10-25 ENCOUNTER — Ambulatory Visit (INDEPENDENT_AMBULATORY_CARE_PROVIDER_SITE_OTHER): Payer: Medicare HMO | Admitting: Vascular Surgery

## 2021-10-25 DIAGNOSIS — I70221 Atherosclerosis of native arteries of extremities with rest pain, right leg: Secondary | ICD-10-CM | POA: Diagnosis not present

## 2021-11-10 ENCOUNTER — Encounter (INDEPENDENT_AMBULATORY_CARE_PROVIDER_SITE_OTHER): Payer: Self-pay | Admitting: *Deleted

## 2022-01-19 ENCOUNTER — Other Ambulatory Visit (INDEPENDENT_AMBULATORY_CARE_PROVIDER_SITE_OTHER): Payer: Self-pay | Admitting: Vascular Surgery

## 2022-01-19 DIAGNOSIS — Z9889 Other specified postprocedural states: Secondary | ICD-10-CM

## 2022-01-24 ENCOUNTER — Ambulatory Visit (INDEPENDENT_AMBULATORY_CARE_PROVIDER_SITE_OTHER): Payer: Medicare HMO

## 2022-01-24 ENCOUNTER — Encounter (INDEPENDENT_AMBULATORY_CARE_PROVIDER_SITE_OTHER): Payer: Self-pay | Admitting: Vascular Surgery

## 2022-01-24 ENCOUNTER — Ambulatory Visit (INDEPENDENT_AMBULATORY_CARE_PROVIDER_SITE_OTHER): Payer: Medicare HMO | Admitting: Vascular Surgery

## 2022-01-24 VITALS — BP 171/76 | HR 97 | Resp 18 | Ht 62.0 in | Wt 152.0 lb

## 2022-01-24 DIAGNOSIS — I739 Peripheral vascular disease, unspecified: Secondary | ICD-10-CM

## 2022-01-24 DIAGNOSIS — Z9889 Other specified postprocedural states: Secondary | ICD-10-CM

## 2022-01-24 DIAGNOSIS — I10 Essential (primary) hypertension: Secondary | ICD-10-CM

## 2022-01-24 DIAGNOSIS — I70221 Atherosclerosis of native arteries of extremities with rest pain, right leg: Secondary | ICD-10-CM

## 2022-01-24 NOTE — Progress Notes (Signed)
MRN : [id number]  Gloria Grimes is a 88 y.o. (1930/11/03) female who presents with chief complaint of  Chief Complaint  Patient presents with   Follow-up    ultrasound  .  History of Present Illness: Patient returns today in follow up of her PAD.  She is walking vigorously.  She really is not having much in the way of claudication or rest pain symptoms despite her known significant peripheral arterial disease.  She has been under a lot of stress recently.  Her ABIs are down today to 0.41 on the right and 0.37 on the left.  Duplex was done showing multiple segments of significant stenosis throughout the right SFA and popliteal segments.  Current Outpatient Medications  Medication Sig Dispense Refill   ascorbic acid (VITAMIN C) 500 MG tablet Take 500 mg by mouth daily.     aspirin EC 81 MG tablet Take 81 mg by mouth daily. Swallow whole.     carvedilol (COREG) 6.25 MG tablet Take 6.25 mg by mouth 2 (two) times daily with a meal.     cetirizine (ZYRTEC) 10 MG tablet Take 10 mg by mouth daily.     furosemide (LASIX) 40 MG tablet Take 40 mg by mouth daily.     Lactobacillus-Inulin ([place]) CAPS Take by mouth.     amoxicillin (AMOXIL) 500 MG capsule Take 500 mg by mouth as directed. Take a 1 hour before dental procedure ( teeth cleaning) (Patient not taking: Reported on 01/24/2022)     atorvastatin (LIPITOR) 10 MG tablet Take 1 tablet (10 mg total) by mouth daily. (Patient not taking: Reported on 04/15/2021) 30 tablet 11   clopidogrel (PLAVIX) 75 MG tablet Take 1 tablet (75 mg total) by mouth daily. (Patient not taking: Reported on 04/15/2021) 30 tablet 11   guaiFENesin (MUCINEX) 600 MG 12 hr tablet Take 1 tablet (600 mg total) by mouth 2 (two) times daily. (Patient not taking: Reported on 12/16/2020) 30 tablet 0   No current facility-administered medications for this visit.    Past Medical History:  Diagnosis Date   Cancer ([place])    ovarian   CHF (congestive heart  failure) ([place])     Past Surgical History:  Procedure Laterality Date   ABDOMINAL HYSTERECTOMY     LOWER EXTREMITY ANGIOGRAPHY Right 12/16/2020   Procedure: LOWER EXTREMITY ANGIOGRAPHY;  Surgeon: Algernon Huxley, MD;  Location: [facility];  Service: Cardiovascular;  Laterality: Right;     Social History   Tobacco Use   Smoking status: Former   Smokeless tobacco: Never  Substance Use Topics   Alcohol use: No    Comment: wine   Drug use: No       No family history on file.   Allergies  Allergen Reactions   Statins Swelling   Azithromycin Other (See Comments)   Clindamycin Hcl Other (See Comments)   Demeclocycline Other (See Comments)   Erythromycin Ethylsuccinate Other (See Comments)   Penicillins    Shellfish Allergy Other (See Comments)    Other reaction(s): Headache Other reaction(s): Other (See Comments)   Triamcinolone Acetonide Other (See Comments)    REVIEW OF SYSTEMS (Negative unless checked)   Constitutional: '[]'$ Weight loss  '[]'$ Fever  '[]'$ Chills Cardiac: '[]'$ Chest pain   '[]'$ Chest pressure   '[]'$ Palpitations   '[]'$ Shortness of breath when laying flat   '[]'$ Shortness of breath at rest   '[]'$ Shortness of breath with exertion. Vascular:  '[]'$ Pain in legs with walking   '[]'$ Pain in legs at  rest   '[]'$ Pain in legs when laying flat   '[]'$ Claudication   '[]'$ Pain in feet when walking  '[]'$ Pain in feet at rest  '[]'$ Pain in feet when laying flat   '[]'$ History of DVT   '[]'$ Phlebitis   '[]'$ Swelling in legs   '[]'$ Varicose veins   '[]'$ Non-healing ulcers Pulmonary:   '[]'$ Uses home oxygen   '[]'$ Productive cough   '[]'$ Hemoptysis   '[]'$ Wheeze  '[]'$ COPD   '[]'$ Asthma Neurologic:  '[]'$ Dizziness  '[]'$ Blackouts   '[]'$ Seizures   '[]'$ History of stroke   '[]'$ History of TIA  '[]'$ Aphasia   '[]'$ Temporary blindness   '[]'$ Dysphagia   '[]'$ Weakness or numbness in arms   '[]'$ Weakness or numbness in legs Musculoskeletal:  '[x]'$ Arthritis   '[]'$ Joint swelling   '[x]'$ Joint pain   '[]'$ Low back pain Hematologic:  '[]'$ Easy bruising  '[]'$ Easy bleeding   '[]'$ Hypercoagulable state    '[]'$ Anemic  '[]'$ Hepatitis Gastrointestinal:  '[]'$ Blood in stool   '[]'$ Vomiting blood  '[]'$ Gastroesophageal reflux/heartburn   '[]'$ Abdominal pain Genitourinary:  '[]'$ Chronic kidney disease   '[]'$ Difficult urination  '[]'$ Frequent urination  '[]'$ Burning with urination   '[]'$ Hematuria Skin:  '[]'$ Rashes   '[]'$ Ulcers   '[]'$ Wounds Psychological:  '[]'$ History of anxiety   '[]'$  History of major depression.   Physical Examination  BP (!) 171/76 (BP Location: Left Arm)   Pulse 97   Resp 18   Ht '5\' 2"'$  (1.575 m)   Wt 152 lb (68.9 kg)   BMI 27.80 kg/m  Gen:  WD/WN, NAD.  Appears much younger than stated age Head: [place]/AT, No temporalis wasting. Ear/Nose/Throat: Hearing grossly intact, nares w/o erythema or drainage Eyes: Conjunctiva clear. Sclera non-icteric Neck: Supple.  Trachea midline Pulmonary:  Good air movement, no use of accessory muscles.  Cardiac: RRR, no JVD Vascular:  Vessel Right Left  Radial Palpable Palpable                          PT Not palpable Not palpable  DP Not palpable Not palpable    Musculoskeletal: M/S 5/5 throughout.  No deformity or atrophy.  Feet are fairly warm and capillary refill is decent.  Trace lower extremity edema. Neurologic: Sensation grossly intact in extremities.  Symmetrical.  Speech is fluent.  Psychiatric: Judgment intact, Mood & affect appropriate for pt's clinical situation. Dermatologic: No rashes or ulcers noted.  No cellulitis or open wounds.      Labs No results found for this or any previous visit (from the past 2160 hour(s)).  Radiology No results found.  Assessment/Plan Essential hypertension blood pressure control important in reducing the progression of atherosclerotic disease. On appropriate oral medications.  Atherosclerosis of native arteries of extremity with rest pain ([place]) Her ABIs are down today to 0.41 on the right and 0.37 on the left.  Duplex was done showing multiple segments of significant stenosis throughout the right SFA and popliteal  segments. Despite these markedly abnormal findings, she really is not having a lot of symptoms.  Nonetheless, with multiple areas of in-stent and near stent stenoses, there is risk of thrombosis and consideration for intervention needs to be given.  She really does not want to have anything done currently given her high stress state and the many things she has on her plate.  I can certainly understand this.  I did discuss with her if she develops any significant claudication symptoms or obviously rest pain or ulceration, we need to consider going ahead and intervening.  Otherwise, we will keep her on antiplatelet therapy.  She cannot tolerate statins.  I will keep a short interval follow-up of 3 months.    Leotis Pain, MD  01/24/2022 4:48 PM    This note was created with Dragon medical transcription system.  Any errors from dictation are purely unintentional

## 2022-01-24 NOTE — Assessment & Plan Note (Signed)
Her ABIs are down today to 0.41 on the right and 0.37 on the left.  Duplex was done showing multiple segments of significant stenosis throughout the right SFA and popliteal segments. Despite these markedly abnormal findings, she really is not having a lot of symptoms.  Nonetheless, with multiple areas of in-stent and near stent stenoses, there is risk of thrombosis and consideration for intervention needs to be given.  She really does not want to have anything done currently given her high stress state and the many things she has on her plate.  I can certainly understand this.  I did discuss with her if she develops any significant claudication symptoms or obviously rest pain or ulceration, we need to consider going ahead and intervening.  Otherwise, we will keep her on antiplatelet therapy.  She cannot tolerate statins.  I will keep a short interval follow-up of 3 months.

## 2022-03-27 ENCOUNTER — Encounter (INDEPENDENT_AMBULATORY_CARE_PROVIDER_SITE_OTHER): Payer: Self-pay

## 2022-04-26 ENCOUNTER — Encounter (INDEPENDENT_AMBULATORY_CARE_PROVIDER_SITE_OTHER): Payer: Medicare HMO

## 2022-04-26 ENCOUNTER — Ambulatory Visit (INDEPENDENT_AMBULATORY_CARE_PROVIDER_SITE_OTHER): Payer: Medicare HMO | Admitting: Nurse Practitioner

## 2022-04-28 ENCOUNTER — Ambulatory Visit (INDEPENDENT_AMBULATORY_CARE_PROVIDER_SITE_OTHER): Payer: Medicare HMO | Admitting: Vascular Surgery

## 2022-04-28 ENCOUNTER — Ambulatory Visit (INDEPENDENT_AMBULATORY_CARE_PROVIDER_SITE_OTHER): Payer: Medicare HMO | Admitting: Nurse Practitioner

## 2022-04-28 ENCOUNTER — Ambulatory Visit (INDEPENDENT_AMBULATORY_CARE_PROVIDER_SITE_OTHER): Payer: Medicare HMO

## 2022-04-28 ENCOUNTER — Encounter (INDEPENDENT_AMBULATORY_CARE_PROVIDER_SITE_OTHER): Payer: Self-pay | Admitting: Nurse Practitioner

## 2022-04-28 VITALS — BP 137/80 | HR 92 | Resp 16 | Wt 155.0 lb

## 2022-04-28 DIAGNOSIS — I70221 Atherosclerosis of native arteries of extremities with rest pain, right leg: Secondary | ICD-10-CM

## 2022-04-28 DIAGNOSIS — I10 Essential (primary) hypertension: Secondary | ICD-10-CM

## 2022-05-05 ENCOUNTER — Ambulatory Visit (INDEPENDENT_AMBULATORY_CARE_PROVIDER_SITE_OTHER): Payer: Medicare HMO | Admitting: Nurse Practitioner

## 2022-05-07 ENCOUNTER — Encounter (INDEPENDENT_AMBULATORY_CARE_PROVIDER_SITE_OTHER): Payer: Self-pay | Admitting: Nurse Practitioner

## 2022-05-07 NOTE — Progress Notes (Signed)
Subjective:    Patient ID: Gloria Grimes, female    DOB: 10/01/1930, 88 y.o.   MRN: [id number] Chief Complaint  Patient presents with   Follow-up    Ultrasound follow up    Patient returns today in follow up of her PAD.  She is walking vigorously.  She really is not having much in the way of claudication or rest pain symptoms despite her known significant peripheral arterial disease.  There are currently no open wounds or ulcerations.  The patient's ABIs have slightly decreased from previous studies.  Her right ABI 0.35 with a left of 0.44.  The previous ABI 0.41 with a left of 0.37.    Review of Systems  Skin:  Negative for wound.  All other systems reviewed and are negative.      Objective:   Physical Exam Vitals reviewed.  HENT:     Head: Normocephalic.  Cardiovascular:     Rate and Rhythm: Normal rate.     Pulses:          Dorsalis pedis pulses are detected w/ Doppler on the right side and detected w/ Doppler on the left side.       Posterior tibial pulses are detected w/ Doppler on the right side and detected w/ Doppler on the left side.  Pulmonary:     Effort: Pulmonary effort is normal.  Skin:    General: Skin is warm and dry.  Neurological:     Mental Status: She is alert and oriented to person, place, and time.  Psychiatric:        Mood and Affect: Mood normal.        Behavior: Behavior normal.        Thought Content: Thought content normal.        Judgment: Judgment normal.     BP 137/80 (BP Location: Left Arm)   Pulse 92   Resp 16   Wt 155 lb (70.3 kg)   BMI 28.35 kg/m   Past Medical History:  Diagnosis Date   Cancer ([place])    ovarian   CHF (congestive heart failure) ([place])     Social History   Socioeconomic History   Marital status: Widowed    Spouse name: Not on file   Number of children: Not on file   Years of education: Not on file   Highest education level: Not on file  Occupational History   Occupation: retired  Tobacco Use    Smoking status: Former   Smokeless tobacco: Never  Substance and Sexual Activity   Alcohol use: No    Comment: wine   Drug use: No   Sexual activity: Never    Birth control/protection: None  Other Topics Concern   Not on file  Social History Narrative   Not on file   Social Determinants of Health   Financial Resource Strain: Not on file  Food Insecurity: Not on file  Transportation Needs: Not on file  Physical Activity: Not on file  Stress: Not on file  Social Connections: Not on file  Intimate Partner Violence: Not on file    Past Surgical History:  Procedure Laterality Date   ABDOMINAL HYSTERECTOMY     LOWER EXTREMITY ANGIOGRAPHY Right 12/16/2020   Procedure: [place];  Surgeon: Algernon Huxley, MD;  Location: [facility];  Service: Cardiovascular;  Laterality: Right;    History reviewed. No pertinent family history.  Allergies  Allergen Reactions   Statins Swelling   Azithromycin Other (  See Comments)   Clindamycin Hcl Other (See Comments)   Demeclocycline Other (See Comments)   Erythromycin Ethylsuccinate Other (See Comments)   Penicillins    Shellfish Allergy Other (See Comments)    Other reaction(s): Headache Other reaction(s): Other (See Comments)   Triamcinolone Acetonide Other (See Comments)       Latest Ref Rng & Units 03/08/2016    8:31 AM  CBC  WBC 3.6 - 11.0 K/uL 11.5   Hemoglobin 12.0 - 16.0 g/dL 12.7   Hematocrit 35.0 - 47.0 % 37.0   Platelets 150 - 440 K/uL 180       CMP     Component Value Date/Time   NA 140 03/10/2016 0320   K 3.6 03/10/2016 0320   CL 99 (L) 03/10/2016 0320   CO2 35 (H) 03/10/2016 0320   GLUCOSE 135 (H) 03/10/2016 0320   BUN 15 12/16/2020 0854   CREATININE 0.52 12/16/2020 0854   CALCIUM 9.1 03/10/2016 0320   GFRNONAA >60 12/16/2020 0854   GFRAA >60 03/10/2016 0320     VAS [place] ABI WITH/WO TBI  Result Date: 05/04/2022  LOWER EXTREMITY DOPPLER STUDY Patient Name:  Gloria Grimes  Date of  Exam:   04/28/2022 Medical Rec #: [id number]       Accession #:    [id number] Date of Birth: 1931/05/10       Patient Gender: F Patient Age:   5 years Exam Location:  [facility] Procedure:      VAS [place] ABI WITH/WO TBI Referring Phys: Corene Cornea DEW --------------------------------------------------------------------------------  Indications: Claudication, and peripheral artery disease. High Risk Factors: Hypertension, past history of smoking.  Vascular Interventions: 12/16/2020 Stent right common iliac, external iliac,                         proximal and distal SFA. Performing Technologist: Delorise Shiner RVT  Examination Guidelines: A complete evaluation includes at minimum, Doppler waveform signals and systolic blood pressure reading at the level of bilateral brachial, anterior tibial, and posterior tibial arteries, when vessel segments are accessible. Bilateral testing is considered an integral part of a complete examination. Photoelectric Plethysmograph (PPG) waveforms and toe systolic pressure readings are included as required and additional duplex testing as needed. Limited examinations for reoccurring indications may be performed as noted.  ABI Findings: +---------+------------------+-----+----------+--------+ Right    Rt Pressure (mmHg)IndexWaveform  Comment  +---------+------------------+-----+----------+--------+ Brachial 142                                       +---------+------------------+-----+----------+--------+ PTA      49                0.35 monophasic         +---------+------------------+-----+----------+--------+ DP       25                0.18 monophasic         +---------+------------------+-----+----------+--------+ Great Toe19                0.13                    +---------+------------------+-----+----------+--------+ +---------+------------------+-----+----------+-------+ Left     Lt Pressure (mmHg)IndexWaveform  Comment  +---------+------------------+-----+----------+-------+ Brachial 136                                      +---------+------------------+-----+----------+-------+  PTA      62                0.44 monophasic        +---------+------------------+-----+----------+-------+ DP       46                0.32 monophasic        +---------+------------------+-----+----------+-------+ Great Toe32                0.23                   +---------+------------------+-----+----------+-------+ +-------+-----------+-----------+------------+------------+ ABI/TBIToday's ABIToday's TBIPrevious ABIPrevious TBI +-------+-----------+-----------+------------+------------+ Right  0.35       0.13       0.41        0.25         +-------+-----------+-----------+------------+------------+ Left   0.44       0.23       0.37        0.21         +-------+-----------+-----------+------------+------------+  Bilateral ABIs appear essentially unchanged compared to prior study on 01/24/2022.  Summary: Right: Resting right ankle-brachial index indicates severe right lower extremity arterial disease. The right toe-brachial index is abnormal. Left: Resting left ankle-brachial index indicates severe left lower extremity arterial disease. The left toe-brachial index is abnormal. *See table(s) above for measurements and observations.  Electronically signed by Hortencia Pilar MD on 05/04/2022 at 12:31:41 PM.    Final        Assessment & Plan:   1. Atherosclerosis of native artery of right lower extremity with rest pain ([place]) Her ABIs are down from previous studies.  Previous duplex showed that there are multiple segments of significant stenosis throughout the right SFA and popliteal segments.  Despite these findings the patient currently does not have any true significant symptoms.  We discussed intervention with the patient however at this time she does not wish to move forward with any intervention.  Given her age  conservative therapy is understandable.  However, it was discussed with the patient and family that if she begins to develop any claudication symptoms or rest pain she should contact her office as soon as possible.  Also the patient performed any wound she should contact her office with plan for intervention as with the current ABIs she likely will not heal lower extremity wounds and sooner intervention will be necessary.  Will continue very close follow-up with patient returning in 3 months.  2. Essential hypertension Continue antihypertensive medications as already ordered, these medications have been reviewed and there are no changes at this time.   Current Outpatient Medications on File Prior to Visit  Medication Sig Dispense Refill   ascorbic acid (VITAMIN C) 500 MG tablet Take 500 mg by mouth daily.     aspirin EC 81 MG tablet Take 81 mg by mouth daily. Swallow whole.     carvedilol (COREG) 6.25 MG tablet Take 6.25 mg by mouth 2 (two) times daily with a meal.     cetirizine (ZYRTEC) 10 MG tablet Take 10 mg by mouth daily.     furosemide (LASIX) 40 MG tablet Take 40 mg by mouth daily.     Lactobacillus-Inulin ([place]) CAPS Take by mouth.     amoxicillin (AMOXIL) 500 MG capsule Take 500 mg by mouth as directed. Take a 1 hour before dental procedure ( teeth cleaning) (Patient not taking: Reported on 01/24/2022)     atorvastatin (LIPITOR) 10 MG tablet Take 1  tablet (10 mg total) by mouth daily. (Patient not taking: Reported on 04/15/2021) 30 tablet 11   clopidogrel (PLAVIX) 75 MG tablet Take 1 tablet (75 mg total) by mouth daily. (Patient not taking: Reported on 04/15/2021) 30 tablet 11   guaiFENesin (MUCINEX) 600 MG 12 hr tablet Take 1 tablet (600 mg total) by mouth 2 (two) times daily. (Patient not taking: Reported on 12/16/2020) 30 tablet 0   No current facility-administered medications on file prior to visit.    There are no Patient Instructions on file for this  visit. No follow-ups on file.   Kris Hartmann, NP

## 2022-07-31 ENCOUNTER — Other Ambulatory Visit (INDEPENDENT_AMBULATORY_CARE_PROVIDER_SITE_OTHER): Payer: Self-pay | Admitting: Nurse Practitioner

## 2022-07-31 DIAGNOSIS — Z9889 Other specified postprocedural states: Secondary | ICD-10-CM

## 2022-08-01 ENCOUNTER — Ambulatory Visit (INDEPENDENT_AMBULATORY_CARE_PROVIDER_SITE_OTHER): Payer: Medicare HMO

## 2022-08-01 ENCOUNTER — Ambulatory Visit (INDEPENDENT_AMBULATORY_CARE_PROVIDER_SITE_OTHER): Payer: Medicare HMO | Admitting: Vascular Surgery

## 2022-08-01 ENCOUNTER — Encounter (INDEPENDENT_AMBULATORY_CARE_PROVIDER_SITE_OTHER): Payer: Self-pay | Admitting: Vascular Surgery

## 2022-08-01 VITALS — BP 153/67 | HR 77 | Resp 16 | Wt 156.0 lb

## 2022-08-01 DIAGNOSIS — I70221 Atherosclerosis of native arteries of extremities with rest pain, right leg: Secondary | ICD-10-CM | POA: Diagnosis not present

## 2022-08-01 DIAGNOSIS — I739 Peripheral vascular disease, unspecified: Secondary | ICD-10-CM

## 2022-08-01 DIAGNOSIS — Z9889 Other specified postprocedural states: Secondary | ICD-10-CM

## 2022-08-01 DIAGNOSIS — I10 Essential (primary) hypertension: Secondary | ICD-10-CM

## 2022-08-01 NOTE — Assessment & Plan Note (Signed)
Although there remains significant disease, both are up 5-10 points.  She is now 0.42 on the right and 0.53 on the left digital pressures of 59 on the right and 63 on the left. Clinically, she is doing well.  Given her good clinical status, we will going to continue to monitor her and not proceed with any intervention.  She will continue her vigorous exercise and her medications.  Recheck with 6 months of invasive studies.

## 2022-08-01 NOTE — Assessment & Plan Note (Signed)
blood pressure control important in reducing the progression of atherosclerotic disease. On appropriate oral medications.  

## 2022-08-01 NOTE — Progress Notes (Signed)
MRN : [id number]  Gloria Grimes is a 89 y.o. (1930-09-06) female who presents with chief complaint of  Chief Complaint  Patient presents with   Follow-up    Ultrasound follow up  .  History of Present Illness: Patient returns today in follow up of her PAD.  She has been exercising vigorously and doing well.  No rest pain or ulceration.  Although there remains significant disease, both are up 5-10 points.  She is now 0.42 on the right and 0.53 on the left digital pressures of 59 on the right and 63 on the left.  Current Outpatient Medications  Medication Sig Dispense Refill   ascorbic acid (VITAMIN C) 500 MG tablet Take 500 mg by mouth daily.     aspirin EC 81 MG tablet Take 81 mg by mouth daily. Swallow whole.     carvedilol (COREG) 6.25 MG tablet Take 6.25 mg by mouth 2 (two) times daily with a meal.     cetirizine (ZYRTEC) 10 MG tablet Take 10 mg by mouth daily.     furosemide (LASIX) 40 MG tablet Take 40 mg by mouth daily.     Lactobacillus-Inulin ([place]) CAPS Take by mouth.     amoxicillin (AMOXIL) 500 MG capsule Take 500 mg by mouth as directed. Take a 1 hour before dental procedure ( teeth cleaning) (Patient not taking: Reported on 01/24/2022)     atorvastatin (LIPITOR) 10 MG tablet Take 1 tablet (10 mg total) by mouth daily. (Patient not taking: Reported on 04/15/2021) 30 tablet 11   clopidogrel (PLAVIX) 75 MG tablet Take 1 tablet (75 mg total) by mouth daily. (Patient not taking: Reported on 04/15/2021) 30 tablet 11   guaiFENesin (MUCINEX) 600 MG 12 hr tablet Take 1 tablet (600 mg total) by mouth 2 (two) times daily. (Patient not taking: Reported on 12/16/2020) 30 tablet 0   No current facility-administered medications for this visit.    Past Medical History:  Diagnosis Date   Cancer ([place])    ovarian   CHF (congestive heart failure) ([place])     Past Surgical History:  Procedure Laterality Date   ABDOMINAL HYSTERECTOMY     LOWER EXTREMITY ANGIOGRAPHY  Right 12/16/2020   Procedure: LOWER EXTREMITY ANGIOGRAPHY;  Surgeon: Algernon Huxley, MD;  Location: [facility];  Service: Cardiovascular;  Laterality: Right;     Social History   Tobacco Use   Smoking status: Former   Smokeless tobacco: Never  Substance Use Topics   Alcohol use: No    Comment: wine   Drug use: No      No family history on file.   Allergies  Allergen Reactions   Statins Swelling   Azithromycin Other (See Comments)   Clindamycin Hcl Other (See Comments)   Demeclocycline Other (See Comments)   Erythromycin Ethylsuccinate Other (See Comments)   Penicillins    Shellfish Allergy Other (See Comments)    Other reaction(s): Headache Other reaction(s): Other (See Comments)   Triamcinolone Acetonide Other (See Comments)     REVIEW OF SYSTEMS (Negative unless checked)  Constitutional: '[]'$ Weight loss  '[]'$ Fever  '[]'$ Chills Cardiac: '[]'$ Chest pain   '[]'$ Chest pressure   '[]'$ Palpitations   '[]'$ Shortness of breath when laying flat   '[]'$ Shortness of breath at rest   '[]'$ Shortness of breath with exertion. Vascular:  '[]'$ Pain in legs with walking   '[]'$ Pain in legs at rest   '[]'$ Pain in legs when laying flat   '[]'$ Claudication   '[]'$ Pain in feet when walking  '[]'$   Pain in feet at rest  '[]'$ Pain in feet when laying flat   '[]'$ History of DVT   '[]'$ Phlebitis   '[]'$ Swelling in legs   '[]'$ Varicose veins   '[]'$ Non-healing ulcers Pulmonary:   '[]'$ Uses home oxygen   '[]'$ Productive cough   '[]'$ Hemoptysis   '[]'$ Wheeze  '[]'$ COPD   '[]'$ Asthma Neurologic:  '[]'$ Dizziness  '[]'$ Blackouts   '[]'$ Seizures   '[]'$ History of stroke   '[]'$ History of TIA  '[]'$ Aphasia   '[]'$ Temporary blindness   '[]'$ Dysphagia   '[]'$ Weakness or numbness in arms   '[]'$ Weakness or numbness in legs Musculoskeletal:  '[x]'$ Arthritis   '[]'$ Joint swelling   '[x]'$ Joint pain   '[]'$ Low back pain Hematologic:  '[]'$ Easy bruising  '[]'$ Easy bleeding   '[]'$ Hypercoagulable state   '[]'$ Anemic   Gastrointestinal:  '[]'$ Blood in stool   '[]'$ Vomiting blood  '[]'$ Gastroesophageal reflux/heartburn   '[]'$ Abdominal  pain Genitourinary:  '[]'$ Chronic kidney disease   '[]'$ Difficult urination  '[]'$ Frequent urination  '[]'$ Burning with urination   '[]'$ Hematuria Skin:  '[]'$ Rashes   '[]'$ Ulcers   '[]'$ Wounds Psychological:  '[]'$ History of anxiety   '[]'$  History of major depression.  Physical Examination  BP (!) 153/67 (BP Location: Left Arm)   Pulse 77   Resp 16   Wt 156 lb (70.8 kg)   BMI 28.53 kg/m  Gen:  WD/WN, NAD. Appears younger than stated age. Head: [place]/AT, No temporalis wasting. Ear/Nose/Throat: Hearing grossly intact, nares w/o erythema or drainage Eyes: Conjunctiva clear. Sclera non-icteric Neck: Supple.  Trachea midline Pulmonary:  Good air movement, no use of accessory muscles.  Cardiac: irregular Vascular:  Vessel Right Left  Radial Palpable Palpable                          PT Not Palpable Trace Palpable  DP Trace Palpable 1+ Palpable   Gastrointestinal: soft, non-tender/non-distended. No guarding/reflex.  Musculoskeletal: M/S 5/5 throughout.  No deformity or atrophy. Trace LE edema. Neurologic: Sensation grossly intact in extremities.  Symmetrical.  Speech is fluent.  Psychiatric: Judgment intact, Mood & affect appropriate for pt's clinical situation. Dermatologic: No rashes or ulcers noted.  No cellulitis or open wounds.      Labs No results found for this or any previous visit (from the past 2160 hour(s)).  Radiology No results found.  Assessment/Plan  Essential hypertension blood pressure control important in reducing the progression of atherosclerotic disease. On appropriate oral medications.   Atherosclerosis of native arteries of extremity with rest pain ([place]) Although there remains significant disease, both are up 5-10 points.  She is now 0.42 on the right and 0.53 on the left digital pressures of 59 on the right and 63 on the left. Clinically, she is doing well.  Given her good clinical status, we will going to continue to monitor her and not proceed with any intervention.  She  will continue her vigorous exercise and her medications.  Recheck with 6 months of invasive studies.    Leotis Pain, MD  08/01/2022 2:19 PM    This note was created with Dragon medical transcription system.  Any errors from dictation are purely unintentional

## 2022-08-07 LAB — VAS US ABI WITH/WO TBI
Left ABI: 0.53
Right ABI: 0.42

## 2023-02-02 ENCOUNTER — Ambulatory Visit (INDEPENDENT_AMBULATORY_CARE_PROVIDER_SITE_OTHER): Payer: Medicare HMO

## 2023-02-02 ENCOUNTER — Ambulatory Visit (INDEPENDENT_AMBULATORY_CARE_PROVIDER_SITE_OTHER): Payer: Medicare HMO | Admitting: Vascular Surgery

## 2023-02-02 VITALS — BP 153/74 | HR 76 | Resp 19 | Ht 62.0 in | Wt 153.8 lb

## 2023-02-02 DIAGNOSIS — I70221 Atherosclerosis of native arteries of extremities with rest pain, right leg: Secondary | ICD-10-CM | POA: Diagnosis not present

## 2023-02-02 DIAGNOSIS — I10 Essential (primary) hypertension: Secondary | ICD-10-CM

## 2023-02-02 NOTE — Progress Notes (Signed)
MRN : [id number]  Gloria Grimes is a 89 y.o. (February 04, 1931) female who presents with chief complaint of  Chief Complaint  Patient presents with   Venous Insufficiency  .  History of Present Illness: Patient returns today in follow up of her PAD.  A couple of years ago, she underwent extensive right lower extremity revascularization for rest pain and limb salvage.  She has done quite well since then.  Even with a drop in her ABI, she is continue to walk and be active and over the past 2 visits she says she now has had resolution of her pain with activity.  Her ABIs today remain reduced but are slightly increased now at 0.47 on the right and 0.56 on the left.  Previously, they were 0.42 on the right and 0.53 on the left.  Current Outpatient Medications  Medication Sig Dispense Refill   ascorbic acid (VITAMIN C) 500 MG tablet Take 500 mg by mouth daily.     aspirin EC 81 MG tablet Take 81 mg by mouth daily. Swallow whole.     carvedilol (COREG) 6.25 MG tablet Take 6.25 mg by mouth 2 (two) times daily with a meal.     cetirizine (ZYRTEC) 10 MG tablet Take 10 mg by mouth daily.     furosemide (LASIX) 40 MG tablet Take 40 mg by mouth daily.     Lactobacillus-Inulin ([facility]) CAPS Take by mouth.     atorvastatin (LIPITOR) 10 MG tablet Take 1 tablet (10 mg total) by mouth daily. (Patient not taking: Reported on 04/15/2021) 30 tablet 11   No current facility-administered medications for this visit.    Past Medical History:  Diagnosis Date   Cancer ([facility])    ovarian   CHF (congestive heart failure) ([facility])     Past Surgical History:  Procedure Laterality Date   ABDOMINAL HYSTERECTOMY     LOWER EXTREMITY ANGIOGRAPHY Right 12/16/2020   Procedure: LOWER EXTREMITY ANGIOGRAPHY;  Surgeon: Annice Needy, MD;  Location: [facility];  Service: Cardiovascular;  Laterality: Right;     Social History   Tobacco Use   Smoking status: Former   Smokeless tobacco: Never   Substance Use Topics   Alcohol use: No    Comment: wine   Drug use: No      No family history on file.   Allergies  Allergen Reactions   Statins Swelling   Azithromycin Other (See Comments)   Clindamycin Hcl Other (See Comments)   Demeclocycline Other (See Comments)   Erythromycin Ethylsuccinate Other (See Comments)   Penicillins    Shellfish Allergy Other (See Comments)    Other reaction(s): Headache Other reaction(s): Other (See Comments)   Triamcinolone Acetonide Other (See Comments)     REVIEW OF SYSTEMS (Negative unless checked)  Constitutional: [] Weight loss  [] Fever  [] Chills Cardiac: [] Chest pain   [] Chest pressure   [] Palpitations   [] Shortness of breath when laying flat   [] Shortness of breath at rest   [] Shortness of breath with exertion. Vascular:  [] Pain in legs with walking   [] Pain in legs at rest   [] Pain in legs when laying flat   [] Claudication   [] Pain in feet when walking  [] Pain in feet at rest  [] Pain in feet when laying flat   [] History of DVT   [] Phlebitis   [] Swelling in legs   [] Varicose veins   [] Non-healing ulcers Pulmonary:   [] Uses home oxygen   [] Productive cough   [] Hemoptysis   []   Wheeze  [] COPD   [] Asthma Neurologic:  [] Dizziness  [] Blackouts   [] Seizures   [] History of stroke   [] History of TIA  [] Aphasia   [] Temporary blindness   [] Dysphagia   [] Weakness or numbness in arms   [] Weakness or numbness in legs Musculoskeletal:  [x] Arthritis   [] Joint swelling   [x] Joint pain   [] Low back pain Hematologic:  [] Easy bruising  [] Easy bleeding   [] Hypercoagulable state   [] Anemic   Gastrointestinal:  [] Blood in stool   [] Vomiting blood  [] Gastroesophageal reflux/heartburn   [] Abdominal pain Genitourinary:  [] Chronic kidney disease   [] Difficult urination  [] Frequent urination  [] Burning with urination   [] Hematuria Skin:  [] Rashes   [] Ulcers   [] Wounds Psychological:  [] History of anxiety   []  History of major depression.  Physical Examination  BP  (!) 153/74 (BP Location: Left Arm)   Pulse 76   Resp 19   Ht 5\' 2"  (1.575 m)   Wt 153 lb 12.8 oz (69.8 kg)   BMI 28.13 kg/m  Gen:  WD/WN, NAD. appears younger than stated age. Head: [place]/AT, No temporalis wasting. Ear/Nose/Throat: Hearing grossly intact, nares w/o erythema or drainage Eyes: Conjunctiva clear. Sclera non-icteric Neck: Supple.  Trachea midline Pulmonary:  Good air movement, no use of accessory muscles.  Cardiac: irregular Vascular:  Vessel Right Left  Radial Palpable Palpable                          PT Not Palpable Not Palpable  DP Trace Palpable 1+ Palpable   Gastrointestinal: soft, non-tender/non-distended. No guarding/reflex.  Musculoskeletal: M/S 5/5 throughout.  No deformity or atrophy. Trace RLE edema. Neurologic: Sensation grossly intact in extremities.  Symmetrical.  Speech is fluent.  Psychiatric: Judgment intact, Mood & affect appropriate for pt's clinical situation. Dermatologic: No rashes or ulcers noted.  No cellulitis or open wounds.      Labs No results found for this or any previous visit (from the past 2160 hour(s)).  Radiology No results found.  Assessment/Plan  Essential hypertension blood pressure control important in reducing the progression of atherosclerotic disease. On appropriate oral medications.   Atherosclerosis of native arteries of extremity with rest pain ([facility]) No longer having rest pain after previous revascularization.  Even with some recurrent reduced perfusion, she is clinically doing quite well currently. Her ABIs today remain reduced but are slightly increased now at 0.47 on the right and 0.56 on the left.  Previously, they were 0.42 on the right and 0.53 on the left.  As long as her clinical status is stable, we will continue to monitor this.  I will plan to see her back in 6 months with noninvasive studies.  Continue current medical regimen.    Festus Barren, MD  02/02/2023 11:58 AM    This note was created  with Dragon medical transcription system.  Any errors from dictation are purely unintentional

## 2023-02-02 NOTE — Assessment & Plan Note (Signed)
blood pressure control important in reducing the progression of atherosclerotic disease. On appropriate oral medications.  

## 2023-02-02 NOTE — Assessment & Plan Note (Signed)
No longer having rest pain after previous revascularization.  Even with some recurrent reduced perfusion, she is clinically doing quite well currently. Her ABIs today remain reduced but are slightly increased now at 0.47 on the right and 0.56 on the left.  Previously, they were 0.42 on the right and 0.53 on the left.  As long as her clinical status is stable, we will continue to monitor this.  I will plan to see her back in 6 months with noninvasive studies.  Continue current medical regimen.

## 2023-02-05 LAB — VAS US ABI WITH/WO TBI
Left ABI: 0.56
Right ABI: 0.47

## 2023-08-10 ENCOUNTER — Ambulatory Visit (INDEPENDENT_AMBULATORY_CARE_PROVIDER_SITE_OTHER): Payer: Medicare HMO

## 2023-08-10 ENCOUNTER — Ambulatory Visit (INDEPENDENT_AMBULATORY_CARE_PROVIDER_SITE_OTHER): Payer: Medicare HMO | Admitting: Nurse Practitioner

## 2023-08-10 ENCOUNTER — Encounter (INDEPENDENT_AMBULATORY_CARE_PROVIDER_SITE_OTHER): Payer: Self-pay | Admitting: Nurse Practitioner

## 2023-08-10 VITALS — BP 151/79 | HR 80 | Resp 18 | Ht 62.0 in | Wt 152.8 lb

## 2023-08-10 DIAGNOSIS — I10 Essential (primary) hypertension: Secondary | ICD-10-CM | POA: Diagnosis not present

## 2023-08-10 DIAGNOSIS — I70221 Atherosclerosis of native arteries of extremities with rest pain, right leg: Secondary | ICD-10-CM

## 2023-08-12 NOTE — Progress Notes (Signed)
 Subjective:    Patient ID: Gloria Grimes, female    DOB: 1931/02/05, 89 y.o.   MRN: [id number] Chief Complaint  Patient presents with   Follow-up    6 month follow up with ABI    Patient returns today in follow up of her PAD.  She is walking vigorously.  She really is not having much in the way of claudication or rest pain symptoms despite her known significant peripheral arterial disease.  There are currently no open wounds or ulcerations.  The patient studies have not changed significantly over the past 6 months.  Her right ABI 0.48 with a left of 0.47.  The previous ABI 0.47 with a left of 0.56.    Review of Systems  Skin:  Negative for wound.  All other systems reviewed and are negative.      Objective:   Physical Exam Vitals reviewed.  HENT:     Head: Normocephalic.  Cardiovascular:     Rate and Rhythm: Normal rate.     Pulses:          Dorsalis pedis pulses are detected w/ Doppler on the right side and detected w/ Doppler on the left side.       Posterior tibial pulses are detected w/ Doppler on the right side and detected w/ Doppler on the left side.  Pulmonary:     Effort: Pulmonary effort is normal.  Skin:    General: Skin is warm and dry.  Neurological:     Mental Status: She is alert and oriented to person, place, and time.  Psychiatric:        Mood and Affect: Mood normal.        Behavior: Behavior normal.        Thought Content: Thought content normal.        Judgment: Judgment normal.     BP (!) 151/79   Pulse 80   Resp 18   Ht 5\' 2"  (1.575 m)   Wt 152 lb 12.8 oz (69.3 kg)   BMI 27.95 kg/m   Past Medical History:  Diagnosis Date   Cancer (HCC)    ovarian   CHF (congestive heart failure) (HCC)     Social History   Socioeconomic History   Marital status: Widowed    Spouse name: Not on file   Number of children: Not on file   Years of education: Not on file   Highest education level: Not on file  Occupational History   Occupation:  retired  Tobacco Use   Smoking status: Former   Smokeless tobacco: Never  Substance and Sexual Activity   Alcohol use: No    Comment: wine   Drug use: No   Sexual activity: Never    Birth control/protection: None  Other Topics Concern   Not on file  Social History Narrative   Not on file   Social Drivers of Health   Financial Resource Strain: Patient Declined (12/20/2022)   Received from [facility]   Overall Financial Resource Strain (CARDIA)    Difficulty of Paying Living Expenses: Patient declined  Food Insecurity: Patient Declined (12/20/2022)   Received from [facility]   Hunger Vital Sign    Worried About Running Out of Food in the Last Year: Patient declined    Ran Out of Food in the Last Year: Patient declined  Transportation Needs: Patient Declined (12/20/2022)   Received from [facility]   [facility] - Transportation  In the past 12 months, has lack of transportation kept you from medical appointments or from getting medications?: Patient declined    Lack of Transportation (Non-Medical): Patient declined  Physical Activity: Not on file  Stress: Not on file  Social Connections: Not on file  Intimate Partner Violence: Not on file    Past Surgical History:  Procedure Laterality Date   ABDOMINAL HYSTERECTOMY     LOWER EXTREMITY ANGIOGRAPHY Right 12/16/2020   Procedure: LOWER EXTREMITY ANGIOGRAPHY;  Surgeon: Annice Needy, MD;  Location: [facility] INVASIVE CV LAB;  Service: Cardiovascular;  Laterality: Right;    History reviewed. No pertinent family history.  Allergies  Allergen Reactions   Statins Swelling   Azithromycin Other (See Comments)   Clindamycin Hcl Other (See Comments)   Demeclocycline Other (See Comments)   Erythromycin Ethylsuccinate Other (See Comments)   Penicillins Other (See Comments)   Shellfish Allergy Other (See Comments)    Other reaction(s): Headache Other reaction(s): Other (See Comments)    Triamcinolone Acetonide Other (See Comments)       Latest Ref Rng & Units 03/08/2016    8:31 AM  CBC  WBC 3.6 - 11.0 K/uL 11.5   Hemoglobin 12.0 - 16.0 g/dL 78.2   Hematocrit 95.6 - 47.0 % 37.0   Platelets 150 - 440 K/uL 180       CMP     Component Value Date/Time   NA 140 03/10/2016 0320   K 3.6 03/10/2016 0320   CL 99 (L) 03/10/2016 0320   CO2 35 (H) 03/10/2016 0320   GLUCOSE 135 (H) 03/10/2016 0320   BUN 15 12/16/2020 0854   CREATININE 0.52 12/16/2020 0854   CALCIUM 9.1 03/10/2016 0320   GFRNONAA >60 12/16/2020 0854   GFRAA >60 03/10/2016 0320         Assessment & Plan:   1. Atherosclerosis of native artery of right lower extremity with rest pain [facility]) Her ABIs are fairly consistent with her previous study 6 months ago.  She does not have any new claudication symptoms or rest pain or ulcerations.  The patient will return in 6 months or sooner if issues arise.  2. Essential hypertension Continue antihypertensive medications as already ordered, these medications have been reviewed and there are no changes at this time.   Current Outpatient Medications on File Prior to Visit  Medication Sig Dispense Refill   ascorbic acid (VITAMIN C) 500 MG tablet Take 500 mg by mouth daily.     aspirin EC 81 MG tablet Take 81 mg by mouth daily. Swallow whole.     atorvastatin (LIPITOR) 10 MG tablet Take 1 tablet (10 mg total) by mouth daily. 30 tablet 11   carvedilol (COREG) 6.25 MG tablet Take 6.25 mg by mouth 2 (two) times daily with a meal.     cetirizine (ZYRTEC) 10 MG tablet Take 10 mg by mouth daily.     furosemide (LASIX) 40 MG tablet Take 40 mg by mouth daily.     Lactobacillus-Inulin (CULTURELLE DIGESTIVE HEALTH) CAPS Take by mouth.     No current facility-administered medications on file prior to visit.    There are no Patient Instructions on file for this visit. No follow-ups on file.   Georgiana Spinner, NP

## 2023-08-16 LAB — VAS US ABI WITH/WO TBI
Left ABI: 0.47
Right ABI: 0.48

## 2023-10-16 ENCOUNTER — Encounter (INDEPENDENT_AMBULATORY_CARE_PROVIDER_SITE_OTHER): Payer: Self-pay

## 2023-12-04 ENCOUNTER — Encounter: Payer: Self-pay | Admitting: Ophthalmology

## 2023-12-07 NOTE — Discharge Instructions (Signed)

## 2023-12-12 ENCOUNTER — Ambulatory Visit
Admission: RE | Admit: 2023-12-12 | Discharge: 2023-12-12 | Disposition: A | Discharge status: Home / Self Care | Source: Ambulatory Visit | Attending: Ophthalmology | Admitting: Ophthalmology

## 2023-12-12 ENCOUNTER — Encounter: Payer: Self-pay | Admitting: Anesthesiology

## 2023-12-12 ENCOUNTER — Ambulatory Visit: Payer: Self-pay | Admitting: Anesthesiology

## 2023-12-12 ENCOUNTER — Other Ambulatory Visit: Payer: Self-pay

## 2023-12-12 ENCOUNTER — Encounter
Admission: RE | Disposition: A | Discharge status: Home / Self Care | Payer: Self-pay | Source: Ambulatory Visit | Attending: Ophthalmology

## 2023-12-12 DIAGNOSIS — E1136 Type 2 diabetes mellitus with diabetic cataract: Secondary | ICD-10-CM | POA: Diagnosis not present

## 2023-12-12 DIAGNOSIS — E1151 Type 2 diabetes mellitus with diabetic peripheral angiopathy without gangrene: Secondary | ICD-10-CM | POA: Insufficient documentation

## 2023-12-12 DIAGNOSIS — I110 Hypertensive heart disease with heart failure: Secondary | ICD-10-CM | POA: Diagnosis not present

## 2023-12-12 DIAGNOSIS — Z87891 Personal history of nicotine dependence: Secondary | ICD-10-CM | POA: Diagnosis not present

## 2023-12-12 DIAGNOSIS — H2511 Age-related nuclear cataract, right eye: Secondary | ICD-10-CM | POA: Diagnosis present

## 2023-12-12 DIAGNOSIS — I509 Heart failure, unspecified: Secondary | ICD-10-CM | POA: Diagnosis not present

## 2023-12-12 HISTORY — PX: CATARACT EXTRACTION W/PHACO: SHX586

## 2023-12-12 SURGERY — PHACOEMULSIFICATION, CATARACT, WITH IOL INSERTION
Anesthesia: Monitor Anesthesia Care | Site: Eye | Laterality: Right

## 2023-12-12 MED ORDER — MIDAZOLAM HCL 2 MG/2ML IJ SOLN
INTRAMUSCULAR | Status: AC
Start: 2023-12-12 — End: 2023-12-12
  Filled 2023-12-12: qty 2

## 2023-12-12 MED ORDER — TETRACAINE HCL 0.5 % OP SOLN
1.0000 [drp] | OPHTHALMIC | Status: DC | PRN
  Administered 2023-12-12 (×3): 1 [drp] via OPHTHALMIC

## 2023-12-12 MED ORDER — SIGHTPATH DOSE#1 BSS IO SOLN: INTRAOCULAR | Status: DC | PRN

## 2023-12-12 MED ORDER — FENTANYL CITRATE (PF) 100 MCG/2ML IJ SOLN
INTRAMUSCULAR | Status: DC | PRN
  Administered 2023-12-12: 25 ug via INTRAVENOUS

## 2023-12-12 MED ORDER — SIGHTPATH DOSE#1 BSS IO SOLN
INTRAOCULAR | Status: DC | PRN
  Administered 2023-12-12: 15 mL via INTRAOCULAR

## 2023-12-12 MED ORDER — FENTANYL CITRATE (PF) 100 MCG/2ML IJ SOLN: 25.0000 ug | INTRAMUSCULAR | Status: DC | PRN

## 2023-12-12 MED ORDER — LIDOCAINE HCL (PF) 2 % IJ SOLN
INTRAOCULAR | Status: DC | PRN
  Administered 2023-12-12: 2 mL

## 2023-12-12 MED ORDER — MOXIFLOXACIN HCL 0.5 % OP SOLN
OPHTHALMIC | Status: DC | PRN
  Administered 2023-12-12: .2 mL via OPHTHALMIC

## 2023-12-12 MED ORDER — ACETAMINOPHEN 10 MG/ML IV SOLN: 1000.0000 mg | Freq: Once | INTRAVENOUS | Status: DC | PRN

## 2023-12-12 MED ORDER — FENTANYL CITRATE (PF) 100 MCG/2ML IJ SOLN
INTRAMUSCULAR | Status: AC
  Filled 2023-12-12: qty 2

## 2023-12-12 MED ORDER — OXYCODONE HCL 5 MG PO TABS: 5.0000 mg | ORAL_TABLET | Freq: Once | ORAL | Status: DC | PRN

## 2023-12-12 MED ORDER — OXYCODONE HCL 5 MG/5ML PO SOLN: 5.0000 mg | Freq: Once | ORAL | Status: DC | PRN

## 2023-12-12 MED ORDER — DROPERIDOL 2.5 MG/ML IJ SOLN: 0.6250 mg | Freq: Once | INTRAMUSCULAR | Status: DC | PRN

## 2023-12-12 MED ORDER — BRIMONIDINE TARTRATE-TIMOLOL 0.2-0.5 % OP SOLN
OPHTHALMIC | Status: DC | PRN
  Administered 2023-12-12: 1 [drp] via OPHTHALMIC

## 2023-12-12 MED ORDER — SIGHTPATH DOSE#1 NA HYALUR & NA CHOND-NA HYALUR IO KIT
PACK | INTRAOCULAR | Status: DC | PRN
  Administered 2023-12-12: 1 via OPHTHALMIC

## 2023-12-12 MED ORDER — ARMC OPHTHALMIC DILATING DROPS
1.0000 | OPHTHALMIC | Status: DC | PRN
  Administered 2023-12-12 (×3): 1 via OPHTHALMIC

## 2023-12-12 MED ORDER — LACTATED RINGERS IV SOLN: INTRAVENOUS | Status: DC

## 2023-12-12 SURGICAL SUPPLY — 10 items
CATARACT SUITE SIGHTPATH (MISCELLANEOUS) ×1 IMPLANT
FEE CATARACT SUITE SIGHTPATH (MISCELLANEOUS) ×1 IMPLANT
GLOVE BIOGEL PI IND STRL 8 (GLOVE) ×1 IMPLANT
GLOVE SURG LX STRL 7.5 STRW (GLOVE) ×1 IMPLANT
GLOVE SURG SYN 6.5 PF PI BL (GLOVE) ×1 IMPLANT
LENS IOL TECNIS EYHANCE 23.0 (Intraocular Lens) IMPLANT
NDL FILTER BLUNT 18X1 1/2 (NEEDLE) ×1 IMPLANT
NEEDLE FILTER BLUNT 18X1 1/2 (NEEDLE) ×1 IMPLANT
RING MALYGIN 7.0 (MISCELLANEOUS) IMPLANT
SYR 3ML LL SCALE MARK (SYRINGE) ×1 IMPLANT

## 2023-12-12 NOTE — Transfer of Care (Signed)
 Immediate Anesthesia Transfer of Care Note  Patient: Gloria Grimes  Procedure(s) Performed: PHACOEMULSIFICATION, CATARACT, WITH IOL INSERTION 12.79 01:03.7 (Right: Eye)  Patient Location: PACU and stage 2  Anesthesia Type:MAC  Level of Consciousness: awake and alert   Airway & Oxygen Therapy: Patient Spontanous Breathing and Patient connected to nasal cannula oxygen  Post-op Assessment: Report given to RN and Post -op Vital signs reviewed and stable  Post vital signs: Reviewed and stable   Last Vitals:  Vitals Value Taken Time  BP 138/59 12/12/23 12:38  Temp 36.8 C 12/12/23 12:38  Pulse 85 12/12/23 12:38  Resp    SpO2 92 % 12/12/23 12:38  Vitals shown include unfiled device data.  Last Pain:  Vitals:   12/12/23 1136  TempSrc: Tympanic         Complications: No notable events documented.

## 2023-12-12 NOTE — Anesthesia Preprocedure Evaluation (Signed)
 Anesthesia Evaluation  Patient identified by MRN, date of birth, ID band Patient awake    Reviewed: Allergy & Precautions, H&P , NPO status , Patient's Chart, lab work & pertinent test results, reviewed documented beta blocker date and time   Airway Mallampati: II  TM Distance: >3 FB Neck ROM: full    Dental no notable dental hx. (+) Teeth Intact   Pulmonary neg pulmonary ROS, former smoker   Pulmonary exam normal breath sounds clear to auscultation       Cardiovascular Exercise Tolerance: Poor hypertension, On Medications + Peripheral Vascular Disease, +CHF, + Orthopnea and + DOE  (-) PND  Rhythm:regular Rate:Normal     Neuro/Psych negative neurological ROS  negative psych ROS   GI/Hepatic negative GI ROS, Neg liver ROS,,,  Endo/Other  negative endocrine ROSdiabetes    Renal/GU      Musculoskeletal   Abdominal   Peds  Hematology negative hematology ROS (+)   Anesthesia Other Findings   Reproductive/Obstetrics negative OB ROS                              Anesthesia Physical Anesthesia Plan  ASA: 3  Anesthesia Plan: MAC   Post-op Pain Management:    Induction:   PONV Risk Score and Plan: 3  Airway Management Planned:   Additional Equipment:   Intra-op Plan:   Post-operative Plan:   Informed Consent: I have reviewed the patients History and Physical, chart, labs and discussed the procedure including the risks, benefits and alternatives for the proposed anesthesia with the patient or authorized representative who has indicated his/her understanding and acceptance.       Plan Discussed with: CRNA  Anesthesia Plan Comments:         Anesthesia Quick Evaluation

## 2023-12-12 NOTE — H&P (Signed)
 [facility]   Primary Care Physician:  Valora Agent, MD Ophthalmologist: Dr. Dene Etienne  Pre-Procedure History & Physical: HPI:  Gloria Grimes is a 89 y.o. female here for ophthalmic surgery.   Past Medical History:  Diagnosis Date   Cancer (HCC)    ovarian   CHF (congestive heart failure) (HCC)     Past Surgical History:  Procedure Laterality Date   ABDOMINAL HYSTERECTOMY     LOWER EXTREMITY ANGIOGRAPHY Right 12/16/2020   Procedure: LOWER EXTREMITY ANGIOGRAPHY;  Surgeon: Marea Selinda RAMAN, MD;  Location: [facility] INVASIVE CV LAB;  Service: Cardiovascular;  Laterality: Right;    Prior to Admission medications   Medication Sig Start Date End Date Taking? Authorizing Provider  ascorbic acid  (VITAMIN C ) 500 MG tablet Take 500 mg by mouth daily.   Yes [provider]  carvedilol  (COREG ) 6.25 MG tablet Take 6.25 mg by mouth 2 (two) times daily with a meal.   Yes [provider]  cetirizine (ZYRTEC) 10 MG tablet Take 10 mg by mouth daily.   Yes [provider]  furosemide  (LASIX ) 40 MG tablet Take 40 mg by mouth daily.   Yes [provider]  Lactobacillus-Inulin (CULTURELLE DIGESTIVE HEALTH) CAPS Take by mouth.   Yes [provider]  aspirin EC 81 MG tablet Take 81 mg by mouth daily. Swallow whole. Patient not taking: Reported on 12/04/2023    [provider]    Allergies as of 10/04/2023 - Review Complete 08/10/2023  Allergen Reaction Noted   Statins Swelling 02/21/2021   Azithromycin  Other (See Comments) 03/06/2017   Clindamycin hcl Other (See Comments) 12/30/2013   Demeclocycline Other (See Comments) 12/30/2013   Erythromycin ethylsuccinate Other (See Comments) 12/30/2013   Penicillins Other (See Comments) 01/10/2016   Shellfish allergy Other (See Comments) 01/10/2016   Triamcinolone acetonide Other (See Comments) 12/30/2013    History reviewed. No pertinent family history.  Social History   Socioeconomic  History   Marital status: Widowed    Spouse name: Not on file   Number of children: Not on file   Years of education: Not on file   Highest education level: Not on file  Occupational History   Occupation: retired  Tobacco Use   Smoking status: Former   Smokeless tobacco: Never  Substance and Sexual Activity   Alcohol use: No    Comment: wine   Drug use: No   Sexual activity: Never    Birth control/protection: None  Other Topics Concern   Not on file  Social History Narrative   Not on file   Social Drivers of Health   Financial Resource Strain: Patient Declined (12/20/2022)   Received from [facility]   Overall Financial Resource Strain (CARDIA)    Difficulty of Paying Living Expenses: Patient declined  Food Insecurity: Patient Declined (12/20/2022)   Received from [facility]   Hunger Vital Sign    Within the past 12 months, you worried that your food would run out before you got the money to buy more.: Patient declined    Within the past 12 months, the food you bought just didn't last and you didn't have money to get more.: Patient declined  Transportation Needs: Patient Declined (12/20/2022)   Received from [facility] - Transportation    In the past 12 months, has lack of transportation kept you from medical appointments or from getting medications?: Patient declined    Lack of Transportation (Non-Medical): Patient  declined  Physical Activity: Not on file  Stress: Not on file  Social Connections: Not on file  Intimate Partner Violence: Not on file    Review of Systems: See HPI, otherwise negative ROS  Physical Exam: BP (!) 155/80   Pulse 87   Temp (!) 96.7 F (35.9 C) (Tympanic)   Resp 20   Ht 5' 2 (1.575 m)   Wt 70.3 kg   SpO2 94%   BMI 28.35 kg/m  General:   Alert,  pleasant and cooperative in NAD Head:  Normocephalic and atraumatic. Lungs:  Clear to auscultation.    Heart:  Regular rate  and rhythm.   Impression/Plan: Gloria Grimes is here for ophthalmic surgery.  Risks, benefits, limitations, and alternatives regarding ophthalmic surgery have been reviewed with the patient.  Questions have been answered.  All parties agreeable.   Gloria GASKIN, MD  12/12/2023, 11:54 AM

## 2023-12-12 NOTE — Op Note (Signed)
 OPERATIVE NOTE  Gloria Grimes [id number] 12/12/2023   PREOPERATIVE DIAGNOSIS:    Nuclear Sclerotic Cataract Right eye with miotic pupil.        H25.11  POSTOPERATIVE DIAGNOSIS: Nuclear Sclerotic Cataract Right eye with miotic pupil.          PROCEDURE:  Phacoemusification with posterior chamber intraocular lens placement of the right eye which required pupil stretching with the Malyugin pupil expansion device. Ultrasound time: Procedure(s): PHACOEMULSIFICATION, CATARACT, WITH IOL INSERTION 12.79 01:03.7 (Right)  LENS:   Implant Name Type Inv. Item Serial No. Manufacturer Lot No. LRB No. Used Action  LENS IOL TECNIS EYHANCE 23.0 - [id number] Intraocular Lens LENS IOL TECNIS EYHANCE 23.0 [id number] [employer]  Right 1 Implanted     SURGEON:  Dene FABIENE Etienne, MD   ANESTHESIA:  Topical with tetracaine  drops and 2% Xylocaine  jelly, augmented with 1% preservative-free intracameral lidocaine .   COMPLICATIONS:  None.   DESCRIPTION OF PROCEDURE:  The patient was identified in the holding room and transported to the operating room and placed in the supine position under the operating microscope. The right eye was identified as the operative eye and it was prepped and draped in the usual sterile ophthalmic fashion.   A 1 millimeter clear-corneal paracentesis was made at the 12:00 position.  0.5 ml of preservative-free 1% lidocaine  was injected into the anterior chamber. The anterior chamber was filled with Viscoat viscoelastic.  A 2.4 millimeter keratome was used to make a near-clear corneal incision at the 9:00 position. A Malyugin pupil expander was then placed through the main incision and into the anterior chamber of the eye.  The edge of the iris was secured on the lip of the pupil expander and it was released, thereby expanding the pupil to approximately 7 millimeters for completion of the cataract surgery.  Additional Viscoat was placed in the anterior chamber.  A cystotome and  capsulorrhexis forceps were used to make a curvilinear capsulorrhexis.   Balanced salt solution was used to hydrodissect and hydrodelineate the lens nucleus.   Phacoemulsification was used in stop and chop fashion to remove the lens, nucleus and epinucleus.  The remaining cortex was aspirated using the irrigation aspiration handpiece.  Additional Provisc was placed into the eye to distend the capsular bag for lens placement.  A lens was then injected into the capsular bag.  The pupil expanding ring was removed using a Kuglen hook and insertion device. The remaining viscoelastic was aspirated from the capsular bag and the anterior chamber.  The anterior chamber was filled with balanced salt solution to inflate to a physiologic pressure.  Wounds were hydrated with balanced salt solution.  The anterior chamber was inflated to a physiologic pressure with balanced salt solution.  No wound leaks were noted.Vigamox  0.2 ml of a 1mg  per ml solution was injected into the anterior chamber for a dose of 0.2 mg of intracameral antibiotic at the completion of the case. Timolol  and Brimonidine  drops were applied to the eye.  The patient was taken to the recovery room in stable condition without complications of anesthesia or surgery.  [name] 12/12/2023, 12:34 PM

## 2023-12-12 NOTE — Anesthesia Postprocedure Evaluation (Signed)
 Anesthesia Post Note  Patient: Gloria Grimes  Procedure(s) Performed: PHACOEMULSIFICATION, CATARACT, WITH IOL INSERTION 12.79 01:03.7 (Right: Eye)  Patient location during evaluation: PACU Anesthesia Type: MAC Level of consciousness: awake and alert Pain management: pain level controlled Vital Signs Assessment: post-procedure vital signs reviewed and stable Respiratory status: spontaneous breathing, nonlabored ventilation, respiratory function stable and patient connected to nasal cannula oxygen Cardiovascular status: stable and blood pressure returned to baseline Postop Assessment: no apparent nausea or vomiting Anesthetic complications: no   No notable events documented.   Last Vitals:  Vitals:   12/12/23 1237 12/12/23 1238  BP: (!) 138/59 (!) 138/59  Pulse: 87 89  Resp:  18  Temp:  36.8 C  SpO2: 91% 90%    Last Pain:  Vitals:   12/12/23 1136  TempSrc: Tympanic                 Lynwood KANDICE Clause

## 2023-12-12 NOTE — Anesthesia Procedure Notes (Signed)
 Procedure Name: MAC Date/Time: 12/12/2023 12:20 PM  Performed by: Niki Manus SAUNDERS, CRNAPre-anesthesia Checklist: Patient identified, Emergency Drugs available, Suction available, Patient being monitored and Timeout performed Patient Re-evaluated:Patient Re-evaluated prior to induction Oxygen Delivery Method: Nasal cannula Preoxygenation: Pre-oxygenation with 100% oxygen Induction Type: IV induction

## 2023-12-13 ENCOUNTER — Encounter: Payer: Self-pay | Admitting: Ophthalmology

## 2023-12-17 ENCOUNTER — Emergency Department

## 2023-12-17 ENCOUNTER — Other Ambulatory Visit: Payer: Self-pay

## 2023-12-17 ENCOUNTER — Inpatient Hospital Stay
Admission: EM | Admit: 2023-12-17 | Discharge: 2023-12-26 | DRG: 291 | Disposition: A | Discharge status: Home / Self Care | Attending: Internal Medicine | Admitting: Internal Medicine

## 2023-12-17 DIAGNOSIS — Z8541 Personal history of malignant neoplasm of cervix uteri: Secondary | ICD-10-CM | POA: Diagnosis not present

## 2023-12-17 DIAGNOSIS — Z90710 Acquired absence of both cervix and uterus: Secondary | ICD-10-CM | POA: Diagnosis not present

## 2023-12-17 DIAGNOSIS — Z87891 Personal history of nicotine dependence: Secondary | ICD-10-CM

## 2023-12-17 DIAGNOSIS — I509 Heart failure, unspecified: Secondary | ICD-10-CM | POA: Diagnosis present

## 2023-12-17 DIAGNOSIS — J479 Bronchiectasis, uncomplicated: Secondary | ICD-10-CM | POA: Diagnosis present

## 2023-12-17 DIAGNOSIS — Z881 Allergy status to other antibiotic agents status: Secondary | ICD-10-CM

## 2023-12-17 DIAGNOSIS — Z79899 Other long term (current) drug therapy: Secondary | ICD-10-CM | POA: Diagnosis not present

## 2023-12-17 DIAGNOSIS — J441 Chronic obstructive pulmonary disease with (acute) exacerbation: Secondary | ICD-10-CM | POA: Diagnosis present

## 2023-12-17 DIAGNOSIS — Z886 Allergy status to analgesic agent status: Secondary | ICD-10-CM

## 2023-12-17 DIAGNOSIS — J9811 Atelectasis: Secondary | ICD-10-CM | POA: Diagnosis present

## 2023-12-17 DIAGNOSIS — Z888 Allergy status to other drugs, medicaments and biological substances status: Secondary | ICD-10-CM

## 2023-12-17 DIAGNOSIS — R110 Nausea: Secondary | ICD-10-CM | POA: Diagnosis present

## 2023-12-17 DIAGNOSIS — Z7982 Long term (current) use of aspirin: Secondary | ICD-10-CM

## 2023-12-17 DIAGNOSIS — I739 Peripheral vascular disease, unspecified: Secondary | ICD-10-CM | POA: Diagnosis present

## 2023-12-17 DIAGNOSIS — J9621 Acute and chronic respiratory failure with hypoxia: Secondary | ICD-10-CM | POA: Diagnosis present

## 2023-12-17 DIAGNOSIS — I110 Hypertensive heart disease with heart failure: Principal | ICD-10-CM | POA: Diagnosis present

## 2023-12-17 DIAGNOSIS — Z66 Do not resuscitate: Secondary | ICD-10-CM | POA: Diagnosis present

## 2023-12-17 DIAGNOSIS — Z91013 Allergy to seafood: Secondary | ICD-10-CM

## 2023-12-17 DIAGNOSIS — K5900 Constipation, unspecified: Secondary | ICD-10-CM | POA: Diagnosis present

## 2023-12-17 DIAGNOSIS — Z8543 Personal history of malignant neoplasm of ovary: Secondary | ICD-10-CM

## 2023-12-17 DIAGNOSIS — J432 Centrilobular emphysema: Secondary | ICD-10-CM | POA: Diagnosis present

## 2023-12-17 DIAGNOSIS — J9601 Acute respiratory failure with hypoxia: Principal | ICD-10-CM

## 2023-12-17 DIAGNOSIS — E873 Alkalosis: Secondary | ICD-10-CM | POA: Diagnosis present

## 2023-12-17 DIAGNOSIS — J9622 Acute and chronic respiratory failure with hypercapnia: Secondary | ICD-10-CM | POA: Diagnosis present

## 2023-12-17 DIAGNOSIS — I5033 Acute on chronic diastolic (congestive) heart failure: Secondary | ICD-10-CM | POA: Diagnosis present

## 2023-12-17 DIAGNOSIS — Z880 Allergy status to penicillin: Secondary | ICD-10-CM

## 2023-12-17 LAB — CBC
HCT: 48.9 % — ABNORMAL HIGH (ref 36.0–46.0)
Hemoglobin: 15.7 g/dL — ABNORMAL HIGH (ref 12.0–15.0)
MCH: 28.5 pg (ref 26.0–34.0)
MCHC: 32.1 g/dL (ref 30.0–36.0)
MCV: 88.7 fL (ref 80.0–100.0)
Platelets: 234 K/uL (ref 150–400)
RBC: 5.51 MIL/uL — ABNORMAL HIGH (ref 3.87–5.11)
RDW: 15.1 % (ref 11.5–15.5)
WBC: 8.8 K/uL (ref 4.0–10.5)
nRBC: 0 % (ref 0.0–0.2)

## 2023-12-17 LAB — BASIC METABOLIC PANEL WITH GFR
Anion gap: 12 (ref 5–15)
BUN: 19 mg/dL (ref 8–23)
CO2: 34 mmol/L — ABNORMAL HIGH (ref 22–32)
Calcium: 9.7 mg/dL (ref 8.9–10.3)
Chloride: 91 mmol/L — ABNORMAL LOW (ref 98–111)
Creatinine, Ser: 0.64 mg/dL (ref 0.44–1.00)
GFR, Estimated: >60 mL/min (ref >60)
Glucose, Bld: 109 mg/dL — ABNORMAL HIGH (ref 70–99)
Potassium: 4.4 mmol/L (ref 3.5–5.1)
Sodium: 137 mmol/L (ref 135–145)

## 2023-12-17 LAB — TROPONIN I (HIGH SENSITIVITY)
Troponin I (High Sensitivity): 10 ng/L (ref <18)
Troponin I (High Sensitivity): 10 ng/L (ref <18)

## 2023-12-17 LAB — BRAIN NATRIURETIC PEPTIDE: B Natriuretic Peptide: 453.6 pg/mL — ABNORMAL HIGH (ref 0.0–100.0)

## 2023-12-17 MED ORDER — ALBUTEROL SULFATE (2.5 MG/3ML) 0.083% IN NEBU
2.5000 mg | INHALATION_SOLUTION | Freq: Once | RESPIRATORY_TRACT | Status: AC
  Administered 2023-12-17: 2.5 mg via RESPIRATORY_TRACT
  Filled 2023-12-17: qty 3

## 2023-12-17 MED ORDER — FUROSEMIDE 10 MG/ML IJ SOLN
40.0000 mg | Freq: Once | INTRAMUSCULAR | Status: AC
  Administered 2023-12-17: 40 mg via INTRAVENOUS
  Filled 2023-12-17: qty 4

## 2023-12-17 MED ORDER — IPRATROPIUM-ALBUTEROL 0.5-2.5 (3) MG/3ML IN SOLN: 3.0000 mL | RESPIRATORY_TRACT | Status: DC | PRN

## 2023-12-17 MED ORDER — ENOXAPARIN SODIUM 40 MG/0.4ML IJ SOSY
40.0000 mg | PREFILLED_SYRINGE | INTRAMUSCULAR | Status: DC
  Administered 2023-12-19 – 2023-12-25 (×6): 40 mg via SUBCUTANEOUS
  Filled 2023-12-17 (×8): qty 0.4

## 2023-12-17 MED ORDER — CARVEDILOL 6.25 MG PO TABS
6.2500 mg | ORAL_TABLET | Freq: Two times a day (BID) | ORAL | Status: DC
  Administered 2023-12-17 – 2023-12-20 (×6): 6.25 mg via ORAL
  Filled 2023-12-17 (×6): qty 1

## 2023-12-17 MED ORDER — FUROSEMIDE 10 MG/ML IJ SOLN
40.0000 mg | Freq: Two times a day (BID) | INTRAMUSCULAR | Status: DC
  Administered 2023-12-17 – 2023-12-20 (×7): 40 mg via INTRAVENOUS
  Filled 2023-12-17 (×7): qty 4

## 2023-12-17 MED ORDER — LOSARTAN POTASSIUM 50 MG PO TABS
50.0000 mg | ORAL_TABLET | Freq: Every day | ORAL | Status: DC
  Filled 2023-12-17 (×3): qty 1

## 2023-12-17 NOTE — ED Notes (Signed)
 Writer helped pt to and from bathroom.

## 2023-12-17 NOTE — ED Provider Notes (Signed)
 [facility] Provider Note    Event Date/Time   First MD Initiated Contact with Patient 12/17/23 1304     (approximate)   History   Shortness of Breath   HPI  Gloria Grimes is a 89 y.o. female with history of CHF and ovarian cancer presents with shortness of breath for the last 5 days ever since she had sedation for cataract surgery.  The patient reports a nonproductive cough.  She has some leg swelling but states that this is chronic, comes and goes, and is not specifically worse than normal.  She denies any fever or chills.  She has no chest pain.  I reviewed the past medical records.  The patient was operated on by Dr. Mittie from ophthalmology on 7/16 for cataracts.  The procedure was uncomplicated and the patient was discharged the same day.   Physical Exam   Triage Vital Signs: ED Triage Vitals  Encounter Vitals Group     BP 12/17/23 1240 (!) 161/91     Girls Systolic BP Percentile --      Girls Diastolic BP Percentile --      Boys Systolic BP Percentile --      Boys Diastolic BP Percentile --      Pulse Rate 12/17/23 1240 85     Resp 12/17/23 1240 (!) 22     Temp 12/17/23 1240 98.3 F (36.8 C)     Temp Source 12/17/23 1240 Oral     SpO2 12/17/23 1240 (!) 80 %     Weight 12/17/23 1245 155 lb (70.3 kg)     Height 12/17/23 1245 5' 2 (1.575 m)     Head Circumference --      Peak Flow --      Pain Score 12/17/23 1245 0     Pain Loc --      Pain Education --      Exclude from Growth Chart --     Most recent vital signs: Vitals:   12/17/23 1330 12/17/23 1400  BP: (!) 146/69 (!) 147/69  Pulse: 88 85  Resp: 20 18  Temp:    SpO2: 97% 94%     General: Awake, no distress.  CV:  Good peripheral perfusion.  Resp:  Normal effort.  Decreased breath sounds bilaterally with faint wheezing. Abd:  No distention.  Other:  2+ bilateral lower extremity edema.   ED Results / Procedures / Treatments   Labs (all labs ordered are listed,  but only abnormal results are displayed) Labs Reviewed  BASIC METABOLIC PANEL WITH GFR - Abnormal; Notable for the following components:      Result Value   Chloride 91 (*)    CO2 34 (*)    Glucose, Bld 109 (*)    All other components within normal limits  CBC - Abnormal; Notable for the following components:   RBC 5.51 (*)    Hemoglobin 15.7 (*)    HCT 48.9 (*)    All other components within normal limits  BRAIN NATRIURETIC PEPTIDE - Abnormal; Notable for the following components:   B Natriuretic Peptide 453.6 (*)    All other components within normal limits  TROPONIN I (HIGH SENSITIVITY)  TROPONIN I (HIGH SENSITIVITY)     EKG  ED ECG REPORT I, Waylon Cassis, the attending physician, personally viewed and interpreted this ECG.  Date: 12/17/2023 EKG Time: 1313 Rate: 86 Rhythm: normal sinus rhythm QRS Axis: Right axis Intervals: normal ST/T Wave abnormalities: Nonspecific ST abnormality Narrative  Interpretation: no evidence of acute ischemia    RADIOLOGY  Chest x-ray: I independently viewed and interpreted the images; there are very small bilateral pleural effusions with no focal consolidation or obvious edema  PROCEDURES:  Critical Care performed: No  Procedures   MEDICATIONS ORDERED IN ED: Medications  furosemide  (LASIX ) injection 40 mg (has no administration in time range)  ipratropium-albuterol  (DUONEB) 0.5-2.5 (3) MG/3ML nebulizer solution 3 mL (has no administration in time range)  albuterol  (PROVENTIL ) (2.5 MG/3ML) 0.083% nebulizer solution 2.5 mg (2.5 mg Nebulization Given 12/17/23 1339)  albuterol  (PROVENTIL ) (2.5 MG/3ML) 0.083% nebulizer solution 2.5 mg (2.5 mg Nebulization Given 12/17/23 1338)  furosemide  (LASIX ) injection 40 mg (40 mg Intravenous Given 12/17/23 1444)     IMPRESSION / MDM / ASSESSMENT AND PLAN / ED COURSE  I reviewed the triage vital signs and the nursing notes.  89 year old female with PMH as noted above presents with  shortness of breath since last week.  She was found to be significantly hypoxic in triage but other vital signs are normal.  On exam she is overall well-appearing.  Breathsounds are diminished bilaterally with some wheezing.  She has peripheral edema.  Differential diagnosis includes, but is not limited to, CHF exacerbation, pleural effusion, acute bronchitis, viral syndrome, pneumonia.  There is no evidence of ACS or any findings to suggest PE.  We will obtain chest x-ray, lab workup, and reassess.  Patient's presentation is most consistent with acute presentation with potential threat to life or bodily function.  The patient is on the cardiac monitor to evaluate for evidence of arrhythmia and/or significant heart rate changes.  ----------------------------------------- 3:17 PM on 12/17/2023 -----------------------------------------  The patient's symptoms have somewhat improved after bronchodilators although her O2 saturation is still in the high 80s on room air.  Troponin is negative.  BNP is somewhat elevated.  Chest x-ray shows trace bilateral pleural effusions but no significant edema.  Given the wheezing on exam I suspect that this is somewhat of a mixed picture between CHF and likely acute bronchitis.  I have ordered IV Lasix .  The patient will need admission for further management.  I consulted Dr. Dorinda from the hospitalist service; based on our discussion he agrees to evaluate the patient for admission.    FINAL CLINICAL IMPRESSION(S) / ED DIAGNOSES   Final diagnoses:  Acute respiratory failure with hypoxia (HCC)     Rx / DC Orders   ED Discharge Orders     None        Note:  This document was prepared using Dragon voice recognition software and may include unintentional dictation errors.    Jacolyn Pae, MD 12/17/23 1517

## 2023-12-17 NOTE — ED Notes (Signed)
 Attempted to wean patient off O2, patient's O2 was 89-90%. Placed back on 2L [place].

## 2023-12-17 NOTE — ED Triage Notes (Signed)
 Pt with a hx of CHF and presents with increased ShOB that started after having cataract surgery on 7/16 and has been progressively getting worse. She does have an associated dry cough. Denies increased swelling or orthopnea. SpO2 was 80% on RA during triage and pt placed on 2L O2 via [place] and it came up to 99%.

## 2023-12-17 NOTE — ED Notes (Signed)
 Assisted patient to restroom.

## 2023-12-17 NOTE — H&P (Signed)
 History and Physical    Patient: Gloria Grimes FMW:[id number] DOB: May 26, 1931 DOA: 12/17/2023 DOS: the patient was seen and examined on 12/17/2023 PCP: Valora Agent, MD  Patient coming from: Home  Chief Complaint: Shortness of breath Chief Complaint  Patient presents with   Shortness of Breath   HPI: Gloria Grimes is a 89 y.o. female with medical history significant of cervical cancer, CHF with EF greater than 55% based on echo in 2022 done at [facility].  Patient underwent cataract surgery involving the right eye about a week ago and according to her ever since the procedure she has been having progressive worsening of shortness of breath associated with PND. Denies cough, abdominal pain, nausea vomiting, chest pain, or urinary complaints Upon arrival to the emergency room patient was found to have saturation of 80%, this improved with 2 L of intranasal oxygen to the low 90s.  BNP elevated to 453.  Chest x-ray showing findings of vascular congestion.  As well as chronic right middle lung lobe collapse.  Hospitalist service was therefore contacted to admit patient for further management Review of Systems: As mentioned in the history of present illness. All other systems reviewed and are negative. Past Medical History:  Diagnosis Date   Cancer (HCC)    ovarian   CHF (congestive heart failure) (HCC)    Past Surgical History:  Procedure Laterality Date   ABDOMINAL HYSTERECTOMY     CATARACT EXTRACTION W/PHACO Right 12/12/2023   Procedure: PHACOEMULSIFICATION, CATARACT, WITH IOL INSERTION 12.79 01:03.7;  Surgeon: Mittie Gaskin, MD;  Location: [facility] ORS;  Service: Ophthalmology;  Laterality: Right;   LOWER EXTREMITY ANGIOGRAPHY Right 12/16/2020   Procedure: LOWER EXTREMITY ANGIOGRAPHY;  Surgeon: Marea Selinda RAMAN, MD;  Location: [facility] INVASIVE CV LAB;  Service: Cardiovascular;  Laterality: Right;   Social History:  reports that she has quit smoking. She has never used  smokeless tobacco. She reports that she does not drink alcohol and does not use drugs.  Allergies  Allergen Reactions   Statins Swelling   Azithromycin  Other (See Comments)   Clindamycin Hcl Other (See Comments)   Demeclocycline Other (See Comments)   Erythromycin Ethylsuccinate Other (See Comments)   Penicillins Other (See Comments)   Shellfish Allergy Other (See Comments)    Other reaction(s): Headache Other reaction(s): Other (See Comments)   Triamcinolone Acetonide Other (See Comments)    History reviewed. No pertinent family history.  Prior to Admission medications   Medication Sig Start Date End Date Taking? Authorizing Provider  ascorbic acid  (VITAMIN C ) 500 MG tablet Take 500 mg by mouth daily.    [provider]  aspirin EC 81 MG tablet Take 81 mg by mouth daily. Swallow whole. Patient not taking: Reported on 12/04/2023    [provider]  carvedilol  (COREG ) 6.25 MG tablet Take 6.25 mg by mouth 2 (two) times daily with a meal.    [provider]  cetirizine (ZYRTEC) 10 MG tablet Take 10 mg by mouth daily.    [provider]  furosemide  (LASIX ) 40 MG tablet Take 40 mg by mouth daily.    [provider]  Lactobacillus-Inulin (CULTURELLE DIGESTIVE HEALTH) CAPS Take by mouth.    [provider]    Physical Exam: General: Elderly female laying in bed on 2 L of intranasal oxygen Musculoskeletal: Bilateral lower extremity pitting edema Respiratory: Basal crackles noted bilaterally no wheezes Cardiovascular: S1-S2 present Abdomen: Soft.  Nondistended nontender CNS: Alert and oriented x 3 Vitals:   12/17/23 1240  12/17/23 1245 12/17/23 1330 12/17/23 1400  BP: (!) 161/91  (!) 146/69 (!) 147/69  Pulse: 85  88 85  Resp: (!) 22  20 18   Temp: 98.3 F (36.8 C)     TempSrc: Oral     SpO2: (!) 80%  97% 94%  Weight:  70.3 kg    Height:  5' 2 (1.575 m)      Data Reviewed: I have reviewed patient chest x-ray as documented  above    Latest Ref Rng & Units 12/17/2023    1:15 PM 03/08/2016    8:31 AM  CBC  WBC 4.0 - 10.5 K/uL 8.8  11.5   Hemoglobin 12.0 - 15.0 g/dL 84.2  87.2   Hematocrit 36.0 - 46.0 % 48.9  37.0   Platelets 150 - 400 K/uL 234  180        Latest Ref Rng & Units 12/17/2023    1:15 PM 12/16/2020    8:54 AM 03/10/2016    3:20 AM  BMP  Glucose 70 - 99 mg/dL 890   864   BUN 8 - 23 mg/dL 19  15  18    Creatinine 0.44 - 1.00 mg/dL 9.35  9.47  9.44   Sodium 135 - 145 mmol/L 137   140   Potassium 3.5 - 5.1 mmol/L 4.4   3.6   Chloride 98 - 111 mmol/L 91   99   CO2 22 - 32 mmol/L 34   35   Calcium  8.9 - 10.3 mg/dL 9.7   9.1      Assessment and Plan: Acute on chronic diastolic congestive heart failure Acute hypoxemic respiratory failure secondary to CHF exacerbation BNP elevated 453 Continue supplemental oxygen Continue IV Lasix  Blood pressure control with losartan  Monitor input and output Daily weight   Chronic right middle lung lobe collapse Continue incentive spirometer Continue as needed nebulization  DVT prophylaxis-continue Lovenox   Advance Care Planning:   Code Status: Prior DNI DNR   Consults: None  Family Communication: Discussed with patient's brother-in-law present at bedside  Severity of Illness: The appropriate patient status for this patient is INPATIENT. Inpatient status is judged to be reasonable and necessary in order to provide the required intensity of service to ensure the patient's safety. The patient's presenting symptoms, physical exam findings, and initial radiographic and laboratory data in the context of their chronic comorbidities is felt to place them at high risk for further clinical deterioration. Furthermore, it is not anticipated that the patient will be medically stable for discharge from the hospital within 2 midnights of admission.   * I certify that at the point of admission it is my clinical judgment that the patient will require inpatient  hospital care spanning beyond 2 midnights from the point of admission due to high intensity of service, high risk for further deterioration and high frequency of surveillance required.*  Author: [name], MD 12/17/2023 2:51 PM  For on call review [email or web address].

## 2023-12-17 NOTE — ED Notes (Signed)
 Pt educated on fluid restriction.

## 2023-12-18 ENCOUNTER — Telehealth (HOSPITAL_COMMUNITY): Payer: Self-pay | Admitting: Pharmacy Technician

## 2023-12-18 ENCOUNTER — Inpatient Hospital Stay
Admit: 2023-12-18 | Discharge: 2023-12-18 | Disposition: A | Discharge status: Home / Self Care | Attending: Internal Medicine

## 2023-12-18 ENCOUNTER — Other Ambulatory Visit (HOSPITAL_COMMUNITY): Payer: Self-pay

## 2023-12-18 DIAGNOSIS — I5033 Acute on chronic diastolic (congestive) heart failure: Secondary | ICD-10-CM | POA: Diagnosis not present

## 2023-12-18 LAB — CBC WITH DIFFERENTIAL/PLATELET
Abs Immature Granulocytes: 0.03 K/uL (ref 0.00–0.07)
Basophils Absolute: 0 K/uL (ref 0.0–0.1)
Basophils Relative: 0 %
Eosinophils Absolute: 0.1 K/uL (ref 0.0–0.5)
Eosinophils Relative: 2 %
HCT: 48.7 % — ABNORMAL HIGH (ref 36.0–46.0)
Hemoglobin: 15.5 g/dL — ABNORMAL HIGH (ref 12.0–15.0)
Immature Granulocytes: 0 %
Lymphocytes Relative: 10 %
Lymphs Abs: 0.8 K/uL (ref 0.7–4.0)
MCH: 29.1 pg (ref 26.0–34.0)
MCHC: 31.8 g/dL (ref 30.0–36.0)
MCV: 91.4 fL (ref 80.0–100.0)
Monocytes Absolute: 0.8 K/uL (ref 0.1–1.0)
Monocytes Relative: 9 %
Neutro Abs: 6.5 K/uL (ref 1.7–7.7)
Neutrophils Relative %: 79 %
Platelets: 232 K/uL (ref 150–400)
RBC: 5.33 MIL/uL — ABNORMAL HIGH (ref 3.87–5.11)
RDW: 15.3 % (ref 11.5–15.5)
WBC: 8.2 K/uL (ref 4.0–10.5)
nRBC: 0 % (ref 0.0–0.2)

## 2023-12-18 LAB — BASIC METABOLIC PANEL WITH GFR
Anion gap: 14 (ref 5–15)
BUN: 16 mg/dL (ref 8–23)
CO2: 34 mmol/L — ABNORMAL HIGH (ref 22–32)
Calcium: 9.4 mg/dL (ref 8.9–10.3)
Chloride: 92 mmol/L — ABNORMAL LOW (ref 98–111)
Creatinine, Ser: 0.62 mg/dL (ref 0.44–1.00)
GFR, Estimated: >60 mL/min (ref >60)
Glucose, Bld: 114 mg/dL — ABNORMAL HIGH (ref 70–99)
Potassium: 3.8 mmol/L (ref 3.5–5.1)
Sodium: 140 mmol/L (ref 135–145)

## 2023-12-18 LAB — ECHOCARDIOGRAM COMPLETE
AR max vel: 2.22 cm2
AV Area VTI: 2.36 cm2
AV Area mean vel: 1.92 cm2
AV Mean grad: 3 mmHg
AV Peak grad: 5.4 mmHg
Ao pk vel: 1.17 m/s
Area-P 1/2: 4.36 cm2
Height: 62 in
MV VTI: 2.34 cm2
S' Lateral: 2.7 cm
Weight: 2419.77 [oz_av]

## 2023-12-18 MED ORDER — PREDNISOLON-MOXIFLOX-BROMFENAC 1-0.5-0.075 % OP SOLN
1.0000 [drp] | Freq: Three times a day (TID) | OPHTHALMIC | Status: DC
  Administered 2023-12-18 – 2023-12-20 (×8): 1 [drp] via OPHTHALMIC
  Filled 2023-12-18: qty 8

## 2023-12-18 MED ORDER — LORATADINE 10 MG PO TABS
10.0000 mg | ORAL_TABLET | Freq: Every day | ORAL | Status: DC
  Administered 2023-12-18 – 2023-12-25 (×7): 10 mg via ORAL
  Filled 2023-12-18 (×8): qty 1

## 2023-12-18 NOTE — Progress Notes (Signed)
 Heart Failure Navigator Progress Note  Assessed for Heart & Vascular TOC clinic readiness.  Patient does not meet criteria due to current [facility] patient.   Navigator will sign off at this time.  Roxy Horseman, RN, BSN [facility] Heart Failure Navigator Secure Chat Only

## 2023-12-18 NOTE — Consult Note (Signed)
 [facility] CLINIC CARDIOLOGY CONSULT NOTE       Patient ID: Gloria Grimes MRN: [id number] DOB/AGE: 88-28-1932 88 y.o.  Admit date: 12/17/2023 Referring Physician dr. Drue Potter Primary Physician Valora Agent, MD Primary Cardiologist Dr. Ammon Reason for Consultation CHF exacerbation   HPI: Gloria Grimes is a 89 y.o. female  with a past medical history of chronic HFpEF, hypertension, PVD, cervical cancer, recent cataracts surgery of right eye 1 week ago who presented to the ED on 12/17/2023 for worsening shortness of breath for the past week, and endorses orthopnea.  Patient denies chest pain.  Cardiology was consulted for further evaluation of acute on chronic HFpEF.  Patient presented to the ED with worsening shortness of breath. Work up in the ED notable for sodium 137, potassium 4.4, creatinine 0.64, hemoglobin 15.7, platelets 234.  Troponins negative x 2.  EKG without acute ischemic changes.  BNP elevated at 450.  CXR with trace bilateral pleural effusions, pulmonary vascular congestion.  Patient received 3 doses of IV Lasix  40 mg.  At the time of my evaluation this afternoon, patient was resting comfortably in hospital bed as an incline.  We discussed patient's symptoms in further detail.  Patient states she has been having worsening shortness of breath for the past week ever since she had her cataract surgery.  Patient endorses orthopnea and states she has been sleeping in a recliner for the past year.  Patient currently on 3 L of O2 however does not require O2 requirement at home.  Patient denies any chest pain, palpitations, lightheadedness or dizziness.  Patient also reports she had some nausea/diarrhea for 2 days over the weekend.  Patient states her shortness of breath feels better since admission s/p IV Lasix .   Review of systems complete and found to be negative unless listed above    Past Medical History:  Diagnosis Date   Cancer (HCC)    ovarian   CHF (congestive heart  failure) (HCC)     Past Surgical History:  Procedure Laterality Date   ABDOMINAL HYSTERECTOMY     CATARACT EXTRACTION W/PHACO Right 12/12/2023   Procedure: PHACOEMULSIFICATION, CATARACT, WITH IOL INSERTION 12.79 01:03.7;  Surgeon: Mittie Gaskin, MD;  Location: [facility] ORS;  Service: Ophthalmology;  Laterality: Right;   LOWER EXTREMITY ANGIOGRAPHY Right 12/16/2020   Procedure: LOWER EXTREMITY ANGIOGRAPHY;  Surgeon: Marea Selinda RAMAN, MD;  Location: [facility] INVASIVE CV LAB;  Service: Cardiovascular;  Laterality: Right;    Medications Prior to Admission  Medication Sig Dispense Refill Last Dose/Taking   ascorbic acid  (VITAMIN C ) 500 MG tablet Take 500 mg by mouth daily.   12/16/2023 Evening   carvedilol  (COREG ) 6.25 MG tablet Take 6.25 mg by mouth 2 (two) times daily with a meal.   12/16/2023 Evening   cetirizine (ZYRTEC) 10 MG tablet Take 10 mg by mouth daily.   12/16/2023 Evening   furosemide  (LASIX ) 40 MG tablet Take 40 mg by mouth daily.   12/17/2023 Morning   Lactobacillus-Inulin (CULTURELLE DIGESTIVE HEALTH) CAPS Take by mouth.   Unknown   aspirin EC 81 MG tablet Take 81 mg by mouth daily. Swallow whole. (Patient not taking: Reported on 12/04/2023)      Social History   Socioeconomic History   Marital status: Widowed    Spouse name: Not on file   Number of children: Not on file   Years of education: Not on file   Highest education level: Not on file  Occupational History   Occupation: retired  Tobacco Use  Smoking status: Former   Smokeless tobacco: Never  Substance and Sexual Activity   Alcohol use: No    Comment: wine   Drug use: No   Sexual activity: Never    Birth control/protection: None  Other Topics Concern   Not on file  Social History Narrative   Not on file   Social Drivers of Health   Financial Resource Strain: Patient Declined (12/20/2022)   Received from [facility]   Overall Financial Resource Strain (CARDIA)    Difficulty of Paying Living  Expenses: Patient declined  Food Insecurity: No Food Insecurity (12/18/2023)   Hunger Vital Sign    Worried About Running Out of Food in the Last Year: Never true    Ran Out of Food in the Last Year: Never true  Transportation Needs: No Transportation Needs (12/18/2023)   PRAPARE - Administrator, Civil Service (Medical): No    Lack of Transportation (Non-Medical): No  Physical Activity: Not on file  Stress: Not on file  Social Connections: Moderately Isolated (12/18/2023)   Social Connection and Isolation Panel    Frequency of Communication with Friends and Family: More than three times a week    Frequency of Social Gatherings with Friends and Family: Three times a week    Attends Religious Services: 1 to 4 times per year    Active Member of Clubs or Organizations: No    Attends Banker Meetings: Never    Marital Status: Widowed  Intimate Partner Violence: Not At Risk (12/18/2023)   Humiliation, Afraid, Rape, and Kick questionnaire    Fear of Current or Ex-Partner: No    Emotionally Abused: No    Physically Abused: No    Sexually Abused: No    History reviewed. No pertinent family history.   Vitals:   12/18/23 0738 12/18/23 0821 12/18/23 1104 12/18/23 1149  BP: 101/69 (!) 138/59 (!) 99/55 (!) 104/58  Pulse: (!) 107 93 92 89  Resp: 20 (!) 22 (!) 22 17  Temp: 98 F (36.7 C)  97.9 F (36.6 C) 97.8 F (36.6 C)  TempSrc: Oral   Oral  SpO2: 95% 98% 97% 99%  Weight:      Height:        PHYSICAL EXAM General: Well-appearing elderly female, well nourished, in no acute distress. HEENT: Normocephalic and atraumatic. Neck: Elevated JVD.   Lungs: Normal respiratory effort on 3L.  Diminished breath sounds bilaterally Heart: HRRR. Normal S1 and S2 without gallops or murmurs.  Abdomen: Non-distended appearing.  Msk: Normal strength and tone for age. Extremities: Warm and well perfused. No clubbing, cyanosis, +1 pedal edema.  Neuro: Alert and oriented X  3. Psych: Answers questions appropriately.   Labs: Basic Metabolic Panel: Recent Labs    12/17/23 1315 12/18/23 0931  NA 137 140  K 4.4 3.8  CL 91* 92*  CO2 34* 34*  GLUCOSE 109* 114*  BUN 19 16  CREATININE 0.64 0.62  CALCIUM  9.7 9.4   Liver Function Tests: No results for input(s): AST, ALT, ALKPHOS, BILITOT, PROT, ALBUMIN in the last 72 hours. No results for input(s): LIPASE, AMYLASE in the last 72 hours. CBC: Recent Labs    12/17/23 1315 12/18/23 0931  WBC 8.8 8.2  NEUTROABS  --  6.5  HGB 15.7* 15.5*  HCT 48.9* 48.7*  MCV 88.7 91.4  PLT 234 232   Cardiac Enzymes: Recent Labs    12/17/23 1315 12/17/23 1648  TROPONINIHS 10 10   BNP: Recent  Labs    12/17/23 1315  BNP 453.6*   D-Dimer: No results for input(s): DDIMER in the last 72 hours. Hemoglobin A1C: No results for input(s): HGBA1C in the last 72 hours. Fasting Lipid Panel: No results for input(s): CHOL, HDL, LDLCALC, TRIG, CHOLHDL, LDLDIRECT in the last 72 hours. Thyroid Function Tests: No results for input(s): TSH, T4TOTAL, T3FREE, THYROIDAB in the last 72 hours.  Invalid input(s): FREET3 Anemia Panel: No results for input(s): VITAMINB12, FOLATE, FERRITIN, TIBC, IRON, RETICCTPCT in the last 72 hours.   Radiology: ECHOCARDIOGRAM COMPLETE Result Date: 12/18/2023    ECHOCARDIOGRAM REPORT   Patient Name:   CELESTIA DUVA Date of Exam: 12/18/2023 Medical Rec #:  [id number]      Height:       62.0 in Accession #:    [id number]     Weight:       151.2 lb Date of Birth:  1930/11/02      BSA:          1.698 m Patient Age:    89 years       BP:           138/59 mmHg Patient Gender: F              HR:           93 bpm. Exam Location:  [facility] Procedure: 2D Echo, Cardiac Doppler and Color Doppler (Both Spectral and Color            Flow Doppler were utilized during procedure). Indications:     CHF-acute diastolic I50.31  History:         Patient has no prior history  of Echocardiogram examinations.                  CHF. Cancer.  Sonographer:     Christopher Furnace Referring Phys:  [id number] [name] Diagnosing Phys: [name] MD  Sonographer Comments: Suboptimal parasternal window and suboptimal apical window. Best view is subcostal. IMPRESSIONS  1. Left ventricular ejection fraction, by estimation, is 60 to 65%. The left ventricle has normal function. The left ventricle has no regional wall motion abnormalities. Left ventricular diastolic parameters are consistent with Grade I diastolic dysfunction (impaired relaxation).  2. Right ventricular systolic function is normal. The right ventricular size is normal.  3. The mitral valve is normal in structure. Mild mitral valve regurgitation. No evidence of mitral stenosis.  4. The aortic valve is normal in structure. Aortic valve regurgitation is not visualized. No aortic stenosis is present.  5. The inferior vena cava is normal in size with greater than 50% respiratory variability, suggesting right atrial pressure of 3 mmHg. FINDINGS  Left Ventricle: Left ventricular ejection fraction, by estimation, is 60 to 65%. The left ventricle has normal function. The left ventricle has no regional wall motion abnormalities. Strain was performed and the global longitudinal strain is indeterminate. The left ventricular internal cavity size was normal in size. There is no left ventricular hypertrophy. Left ventricular diastolic parameters are consistent with Grade I diastolic dysfunction (impaired relaxation). Right Ventricle: The right ventricular size is normal. No increase in right ventricular wall thickness. Right ventricular systolic function is normal. Left Atrium: Left atrial size was normal in size. Right Atrium: Right atrial size was normal in size. Pericardium: There is no evidence of pericardial effusion. Mitral Valve: The mitral valve is normal in structure. Mild mitral valve regurgitation. No evidence of mitral valve stenosis.  MV peak gradient, 6.5 mmHg. The  mean mitral valve gradient is 3.0 mmHg. Tricuspid Valve: The tricuspid valve is normal in structure. Tricuspid valve regurgitation is mild . No evidence of tricuspid stenosis. Aortic Valve: The aortic valve is normal in structure. Aortic valve regurgitation is not visualized. No aortic stenosis is present. Aortic valve mean gradient measures 3.0 mmHg. Aortic valve peak gradient measures 5.4 mmHg. Aortic valve area, by VTI measures 2.36 cm. Pulmonic Valve: The pulmonic valve was normal in structure. Pulmonic valve regurgitation is not visualized. No evidence of pulmonic stenosis. Aorta: The aortic root is normal in size and structure. Venous: The inferior vena cava is normal in size with greater than 50% respiratory variability, suggesting right atrial pressure of 3 mmHg. IAS/Shunts: No atrial level shunt detected by color flow Doppler. Additional Comments: 3D was performed not requiring image post processing on an independent workstation and was indeterminate.  LEFT VENTRICLE PLAX 2D LVIDd:         4.30 cm LVIDs:         2.70 cm LV PW:         1.00 cm LV IVS:        1.00 cm LVOT diam:     2.00 cm LV SV:         53 LV SV Index:   31 LVOT Area:     3.14 cm  RIGHT VENTRICLE RV Basal diam:  3.90 cm RV Mid diam:    2.80 cm LEFT ATRIUM             Index        RIGHT ATRIUM           Index LA diam:        3.10 cm 1.83 cm/m   RA Area:     15.90 cm LA Vol (A2C):   32.1 ml 18.91 ml/m  RA Volume:   42.10 ml  24.80 ml/m LA Vol (A4C):   27.1 ml 15.96 ml/m LA Biplane Vol: 31.2 ml 18.38 ml/m  AORTIC VALVE AV Area (Vmax):    2.22 cm AV Area (Vmean):   1.92 cm AV Area (VTI):     2.36 cm AV Vmax:           116.50 cm/s AV Vmean:          85.650 cm/s AV VTI:            0.225 m AV Peak Grad:      5.4 mmHg AV Mean Grad:      3.0 mmHg LVOT Vmax:         82.20 cm/s LVOT Vmean:        52.400 cm/s LVOT VTI:          0.169 m LVOT/AV VTI ratio: 0.75  AORTA Ao Root diam: 2.40 cm MITRAL VALVE                 TRICUSPID VALVE MV Area (PHT): 4.36 cm     TR Peak grad:   26.6 mmHg MV Area VTI:   2.34 cm     TR Vmax:        258.00 cm/s MV Peak grad:  6.5 mmHg MV Mean grad:  3.0 mmHg     SHUNTS MV Vmax:       1.27 m/s     Systemic VTI:  0.17 m MV Vmean:      84.8 cm/s    Systemic Diam: 2.00 cm MV Decel Time: 174 msec MV E velocity: 65.00 cm/s MV A velocity:  104.00 cm/s MV E/A ratio:  0.62 [name] MD Electronically signed by [name] MD Signature Date/Time: 12/18/2023/1:26:03 PM    Final    DG Chest 2 View Result Date: 12/17/2023 CLINICAL DATA:  Shortness of breath. EXAM: CHEST - 2 VIEW COMPARISON:  CT chest 03/05/2020 and chest radiograph 11/25/2019. FINDINGS: Trachea is midline. Heart size stable. Thoracic aorta is calcified. Chronic right middle lobe collapse lungs are otherwise clear. Trace bilateral pleural effusions. IMPRESSION: 1. Trace bilateral pleural effusions. 2. Chronic right middle lobe collapse. Electronically Signed   By: Newell Eke M.D.   On: 12/17/2023 13:25    ECHO as above.  TELEMETRY reviewed by me 12/18/2023: Sinus rhythm rate 80s  EKG reviewed by me: Sinus rhythm, rate 86 bpm without acute ischemic changes.  Data reviewed by me 12/18/2023: last 24h vitals tele labs imaging I/O ED provider note, admission H&P.  Principal Problem:   CHF (congestive heart failure) (HCC)    ASSESSMENT AND PLAN:   TAVIA STAVE is a 89 y.o. female  with a past medical history of chronic HFpEF, hypertension, PVD, cervical cancer, recent cataracts surgery of right eye 1 week ago who presented to the ED on 12/17/2023 for worsening shortness of breath for the past week, and endorses orthopnea.  Patient denies chest pain.  Cardiology was consulted for further evaluation of acute on chronic HFpEF.  # Acute on chronic HFpEF # Hypertension Patient with worsening SOB. NP elevated at 450.  CXR with trace bilateral pleural effusions, pulmonary vascular congestion.  Echo this admission  with preserved EF of 60-65%, no RWMA, grade 1 diastolic dysfunction, mild MR.  Of note patient states she has lot of medication allergies and does not like starting new ones due to this. - Continue IV Lasix  40 mg twice daily.  Closely monitor UOP and renal function. - Continue losartan  50 mg daily. - Continue Coreg  6.25 mg twice daily. - Will consider initiating spironolactone  tomorrow. - Would like to initiate Farxiga, however co-pay is high at $150.  Patient prefers we do not initiate.  This patient's plan of care was discussed and created with Dr. Dooms and he is in agreement.  Signed: Dorene Comfort, PA-C  12/18/2023, 2:04 PM [facility]

## 2023-12-18 NOTE — Progress Notes (Signed)
*  PRELIMINARY RESULTS* Echocardiogram 2D Echocardiogram has been performed.  Gloria Grimes 12/18/2023, 10:06 AM

## 2023-12-18 NOTE — Telephone Encounter (Signed)
 Patient Product/process development scientist completed.    The patient is insured through [employer]. Patient has Medicare and is not eligible for a copay card, but may be able to apply for patient assistance or Medicare RX Payment Plan (Patient Must reach out to their plan, if eligible for payment plan), if available.    Ran test claim for Farxiga 10 mg and the current 30 day co-pay is $151.05.   This test claim was processed through [facility]- copay amounts may vary at other pharmacies due to pharmacy/plan contracts, or as the patient moves through the different stages of their insurance plan.     Reyes Sharps, CPHT Pharmacy Technician III Certified Patient Advocate [facility] Patient Advocate Team Direct Number: [phone]  Fax: [phone]

## 2023-12-18 NOTE — Plan of Care (Signed)
  Problem: Clinical Measurements: Goal: Ability to maintain clinical measurements within normal limits will improve Outcome: Progressing   Problem: Activity: Goal: Risk for activity intolerance will decrease Outcome: Progressing   Problem: Coping: Goal: Level of anxiety will decrease Outcome: Progressing   Problem: Safety: Goal: Ability to remain free from injury will improve Outcome: Progressing   Problem: Skin Integrity: Goal: Risk for impaired skin integrity will decrease Outcome: Progressing   

## 2023-12-18 NOTE — Progress Notes (Signed)
 Progress Note   Patient: Gloria Grimes FMW:[id number] DOB: [birth year] DOA: 12/17/2023     1 DOS: the patient was seen and examined on 12/18/2023   Brief hospital course: Gloria Grimes is a 89 y.o. female with medical history significant of cervical cancer, CHF with EF greater than 55% based on echo in 2022 done at [facility].  Patient underwent cataract surgery involving the right eye about a week ago and according to her ever since the procedure she has been having progressive worsening of shortness of breath associated with PND. Denies cough, abdominal pain, nausea vomiting, chest pain, or urinary complaints Upon arrival to the emergency room patient was found to have saturation of 80%, this improved with 2 L of intranasal oxygen to the low 90s.  BNP elevated to 453.  Chest x-ray showing findings of vascular congestion.  As well as chronic right middle lung lobe collapse.  Hospitalist service was therefore contacted to admit patient for further management   Assessment and Plan: Acute on chronic diastolic congestive heart failure Acute hypoxemic respiratory failure secondary to CHF exacerbation BNP elevated 453 Continue supplemental oxygen and wean off as tolerated. Continue IV Lasix  Blood pressure control with losartan  and Coreg  Monitor input and output Daily weight Cardiology consulted.  Appreciate input     Chronic right middle lung lobe collapse Continue incentive spirometer Continue as needed nebulization   DVT prophylaxis-continue Lovenox    Advance Care Planning:   Code Status: Prior DNI DNR I discussed with patient and she wants to be DNR/DNI given that she has lived a very good life   Consults: Cardiology  Subjective:  Patient seen and examined still requiring 2 L of oxygen Respiratory function improved today Denies nausea abdominal pain chest pain  Physical Exam:  General: Elderly female laying in bed on 2 L of intranasal oxygen Musculoskeletal:  Bilateral lower extremity pitting edema Respiratory: Basal crackles noted bilaterally no wheezes Cardiovascular: S1-S2 present Abdomen: Soft.  Nondistended nontender CNS: Alert and oriented x 3  Vitals:   12/18/23 0821 12/18/23 1104 12/18/23 1149 12/18/23 1532  BP: (!) 138/59 (!) 99/55 (!) 104/58 (!) 113/44  Pulse: 93 92 89 88  Resp: (!) 22 (!) 22 17 18   Temp:  97.9 F (36.6 C) 97.8 F (36.6 C) 97.9 F (36.6 C)  TempSrc:   Oral   SpO2: 98% 97% 99% 93%  Weight:      Height:        Data Reviewed: Chest x-ray showing findings of minimal bilateral pleural effusion    Latest Ref Rng & Units 12/18/2023    9:31 AM 12/17/2023    1:15 PM 03/08/2016    8:31 AM  CBC  WBC 4.0 - 10.5 K/uL 8.2  8.8  11.5   Hemoglobin 12.0 - 15.0 g/dL 84.4  84.2  87.2   Hematocrit 36.0 - 46.0 % 48.7  48.9  37.0   Platelets 150 - 400 K/uL 232  234  180        Latest Ref Rng & Units 12/18/2023    9:31 AM 12/17/2023    1:15 PM 12/16/2020    8:54 AM  BMP  Glucose 70 - 99 mg/dL 885  890    BUN 8 - 23 mg/dL 16  19  15    Creatinine 0.44 - 1.00 mg/dL 9.37  9.35  9.47   Sodium 135 - 145 mmol/L 140  137    Potassium 3.5 - 5.1 mmol/L 3.8  4.4    Chloride  98 - 111 mmol/L 92  91    CO2 22 - 32 mmol/L 34  34    Calcium  8.9 - 10.3 mg/dL 9.4  9.7       Family Communication: Discussed with patient's son present at bedside  Disposition: Status is: Inpatient   Time spent: 70 minutes  Author: [name], MD 12/18/2023 4:42 PM  For on call review [email or web address].

## 2023-12-18 NOTE — Progress Notes (Signed)
*  PRELIMINARY RESULTS* Echocardiogram 2D Echocardiogram has been performed.  Gloria Grimes 12/18/2023, 10:05 AM

## 2023-12-18 NOTE — Plan of Care (Signed)
 Alert and oriented.  Lasix  IV BID continues. Ambulatory to bathroom.  Declined to start losartan  dose.  Cardiology consulted.   Problem: Education: Goal: Knowledge of General Education information will improve Description: Including pain rating scale, medication(s)/side effects and non-pharmacologic comfort measures Outcome: Progressing   Problem: Health Behavior/Discharge Planning: Goal: Ability to manage health-related needs will improve Outcome: Progressing   Problem: Clinical Measurements: Goal: Ability to maintain clinical measurements within normal limits will improve Outcome: Progressing Goal: Will remain free from infection Outcome: Progressing Goal: Diagnostic test results will improve Outcome: Progressing

## 2023-12-19 DIAGNOSIS — I5033 Acute on chronic diastolic (congestive) heart failure: Secondary | ICD-10-CM | POA: Diagnosis not present

## 2023-12-19 LAB — CBC
HCT: 48.3 % — ABNORMAL HIGH (ref 36.0–46.0)
Hemoglobin: 14.8 g/dL (ref 12.0–15.0)
MCH: 28.7 pg (ref 26.0–34.0)
MCHC: 30.6 g/dL (ref 30.0–36.0)
MCV: 93.6 fL (ref 80.0–100.0)
Platelets: 203 K/uL (ref 150–400)
RBC: 5.16 MIL/uL — ABNORMAL HIGH (ref 3.87–5.11)
RDW: 15.2 % (ref 11.5–15.5)
WBC: 8.2 K/uL (ref 4.0–10.5)
nRBC: 0 % (ref 0.0–0.2)

## 2023-12-19 LAB — BASIC METABOLIC PANEL WITH GFR
Anion gap: 12 (ref 5–15)
BUN: 20 mg/dL (ref 8–23)
CO2: 37 mmol/L — ABNORMAL HIGH (ref 22–32)
Calcium: 8.9 mg/dL (ref 8.9–10.3)
Chloride: 91 mmol/L — ABNORMAL LOW (ref 98–111)
Creatinine, Ser: 0.59 mg/dL (ref 0.44–1.00)
GFR, Estimated: >60 mL/min (ref >60)
Glucose, Bld: 116 mg/dL — ABNORMAL HIGH (ref 70–99)
Potassium: 3.5 mmol/L (ref 3.5–5.1)
Sodium: 140 mmol/L (ref 135–145)

## 2023-12-19 MED ORDER — SPIRONOLACTONE 12.5 MG HALF TABLET
12.5000 mg | ORAL_TABLET | Freq: Every day | ORAL | Status: DC
  Administered 2023-12-20: 12.5 mg via ORAL
  Filled 2023-12-19 (×2): qty 1

## 2023-12-19 MED ORDER — LOSARTAN POTASSIUM 25 MG PO TABS
12.5000 mg | ORAL_TABLET | Freq: Every day | ORAL | Status: DC
  Administered 2023-12-20 – 2023-12-26 (×7): 12.5 mg via ORAL
  Filled 2023-12-19 (×7): qty 1

## 2023-12-19 MED ORDER — SPIRONOLACTONE 25 MG PO TABS
25.0000 mg | ORAL_TABLET | Freq: Every day | ORAL | Status: DC
  Administered 2023-12-19: 25 mg via ORAL
  Filled 2023-12-19 (×2): qty 1

## 2023-12-19 MED ORDER — LOSARTAN POTASSIUM 25 MG PO TABS: 25.0000 mg | ORAL_TABLET | Freq: Every day | ORAL | Status: DC

## 2023-12-19 MED ORDER — POTASSIUM CHLORIDE CRYS ER 20 MEQ PO TBCR
40.0000 meq | EXTENDED_RELEASE_TABLET | Freq: Once | ORAL | Status: AC
  Administered 2023-12-19: 40 meq via ORAL
  Filled 2023-12-19: qty 2

## 2023-12-19 NOTE — Progress Notes (Signed)
 Physical Therapy Evaluation Patient Details Name: Gloria Grimes MRN: [id number] DOB: [birth year] Today's Date: 12/19/2023  History of Present Illness  Pt is a 89 y/o female admitted secondary to SOB and found to have acute on chronic HFpEF. PMH including but not limited to cervical cancer, CHF with EF greater than 55% based on echo in 2022 done at [facility].  Clinical Impression  Pt presented supine in bed with HOB elevated, awake and willing to participate in therapy session. Prior to admission, pt reported that she ambulated with use of a rollator for longer distances, ambulates within her home without an AD. Pt lives alone in an ILF ([facility]). At the time of evaluation, pt moving very well. She was able to complete bed mobility and transfers at a modified independent level. Additionally, she ambulated in the hallway with use of a RW and CGA for safety, progressing to supervision. Pt with no LOB or instability noted. PT will continue to follow-up with pt acutely to progress mobility as tolerated and to ensure a safe d/c home when medically appropriate.         If plan is discharge home, recommend the following: Assist for transportation   Can travel by private vehicle        Equipment Recommendations None recommended by PT  Recommendations for Other Services       Functional Status Assessment Patient has had a recent decline in their functional status and demonstrates the ability to make significant improvements in function in a reasonable and predictable amount of time.     Precautions / Restrictions Precautions Precautions: Fall Recall of Precautions/Restrictions: Intact Restrictions Weight Bearing Restrictions Per Provider Order: No      Mobility  Bed Mobility Overal bed mobility: Modified Independent                  Transfers Overall transfer level: Modified independent Equipment used: Rolling walker (2 wheels)                     Ambulation/Gait Ambulation/Gait assistance: Contact guard assist, Supervision Gait Distance (Feet): 250 Feet Assistive device: Rolling walker (2 wheels) Gait Pattern/deviations: Step-through pattern, Decreased stride length, Trunk flexed Gait velocity: decreased     General Gait Details: pt ambulating with a slow, steady gait with use of RW; unable to maintain safe proximity to RW despite cueing (typically uses a rollator); no LOB or need for external physical assistance  Stairs            Wheelchair Mobility     Tilt Bed    Modified Rankin (Stroke Patients Only)       Balance Overall balance assessment: Needs assistance Sitting-balance support: Feet supported Sitting balance-Leahy Scale: Good     Standing balance support: During functional activity, Single extremity supported, Bilateral upper extremity supported, No upper extremity supported Standing balance-Leahy Scale: Fair Standing balance comment: pt able to stand and complete pericare after toileting with supervision for safety                             Pertinent Vitals/Pain Pain Assessment Pain Assessment: No/denies pain    Home Living Family/patient expects to be discharged to:: Private residence Living Arrangements: Alone Available Help at Discharge: Family;Neighbor;Available PRN/intermittently Type of Home: Independent living facility [facility] [place]) Home Access: Level entry       Home Layout: One level Home Equipment: Rollator (4 wheels)  Prior Function Prior Level of Function : Independent/Modified Independent;Driving             Mobility Comments: ambulates with a rollator ADLs Comments: independent with ADLs, ILF provides meals     Extremity/Trunk Assessment   Upper Extremity Assessment Upper Extremity Assessment: Generalized weakness    Lower Extremity Assessment Lower Extremity Assessment: Generalized weakness       Communication    Communication Communication: No apparent difficulties    Cognition Arousal: Alert Behavior During Therapy: WFL for tasks assessed/performed   PT - Cognitive impairments: No apparent impairments                         Following commands: Intact       Cueing Cueing Techniques: Verbal cues     General Comments      Exercises     Assessment/Plan    PT Assessment Patient needs continued PT services  PT Problem List Decreased strength;Decreased activity tolerance;Decreased balance;Decreased mobility;Decreased coordination;Decreased knowledge of use of DME;Decreased safety awareness;Decreased knowledge of precautions;Cardiopulmonary status limiting activity       PT Treatment Interventions DME instruction;Gait training;Stair training;Functional mobility training;Therapeutic activities;Therapeutic exercise;Balance training;Neuromuscular re-education;Patient/family education    PT Goals (Current goals can be found in the Care Plan section)  Acute Rehab PT Goals Patient Stated Goal: return home soon PT Goal Formulation: With patient Time For Goal Achievement: 01/02/24 Potential to Achieve Goals: Good    Frequency Min 3X/week     Co-evaluation               AM-PAC PT 6 Clicks Mobility  Outcome Measure Help needed turning from your back to your side while in a flat bed without using bedrails?: None Help needed moving from lying on your back to sitting on the side of a flat bed without using bedrails?: None Help needed moving to and from a bed to a chair (including a wheelchair)?: None Help needed standing up from a chair using your arms (e.g., wheelchair or bedside chair)?: None Help needed to walk in hospital room?: A Little Help needed climbing 3-5 steps with a railing? : A Lot 6 Click Score: 21    End of Session   Activity Tolerance: Patient tolerated treatment well Patient left: in bed;with call bell/phone within reach;with bed alarm set;Other  (comment) (sitting EOB) Nurse Communication: Mobility status PT Visit Diagnosis: Other abnormalities of gait and mobility (R26.89)    Time: 8773-8744 PT Time Calculation (min) (ACUTE ONLY): 29 min   Charges:   PT Evaluation $PT Eval Moderate Complexity: 1 Mod PT Treatments $Gait Training: 8-22 mins PT General Charges $$ ACUTE PT VISIT: 1 Visit         Delon DELENA KLEIN, DPT  Acute Rehabilitation Services Office [phone]   Delon HERO Cammie Faulstich 12/19/2023, 1:07 PM

## 2023-12-19 NOTE — Plan of Care (Signed)
  Problem: Health Behavior/Discharge Planning: Goal: Ability to manage health-related needs will improve Outcome: Progressing   Problem: Clinical Measurements: Goal: Diagnostic test results will improve Outcome: Progressing   Problem: Clinical Measurements: Goal: Respiratory complications will improve Outcome: Progressing   Problem: Activity: Goal: Risk for activity intolerance will decrease Outcome: Progressing   Problem: Safety: Goal: Ability to remain free from injury will improve Outcome: Progressing

## 2023-12-19 NOTE — Progress Notes (Signed)
 [facility] CLINIC CARDIOLOGY PROGRESS NOTE       Patient ID: Gloria Grimes MRN: [id number] DOB/AGE: June 01, 1930 89 y.o.  Admit date: 12/17/2023 Referring Physician dr. Drue Potter Primary Physician Valora Agent, MD Primary Cardiologist Dr. Ammon Reason for Consultation CHF exacerbation   HPI: Gloria Grimes is a 89 y.o. female  with a past medical history of chronic HFpEF, hypertension, PVD, cervical cancer, recent cataracts surgery of right eye 1 week ago who presented to the ED on 12/17/2023 for worsening shortness of breath for the past week, and endorses orthopnea.  Patient denies chest pain.  Cardiology was consulted for further evaluation of acute on chronic HFpEF.  Interval History: -Patient seen and examined this AM and laying comfortably in hospital bed with son at bedside, laying nearly flat. Patient states she is feeling better today, breathing getting better and denies chest pain, palpitations or lightheadedness. LEE improved, near baseline. -Patients BP and HR stable this AM. Overnight Tele showed no significant events.  -Yesterday UOP 1.5L, with stable renal function. -Patient remains on 2L with stable SpO2.    Review of systems complete and found to be negative unless listed above    Past Medical History:  Diagnosis Date   Cancer (HCC)    ovarian   CHF (congestive heart failure) (HCC)     Past Surgical History:  Procedure Laterality Date   ABDOMINAL HYSTERECTOMY     CATARACT EXTRACTION W/PHACO Right 12/12/2023   Procedure: PHACOEMULSIFICATION, CATARACT, WITH IOL INSERTION 12.79 01:03.7;  Surgeon: Mittie Gaskin, MD;  Location: [facility] ORS;  Service: Ophthalmology;  Laterality: Right;   LOWER EXTREMITY ANGIOGRAPHY Right 12/16/2020   Procedure: LOWER EXTREMITY ANGIOGRAPHY;  Surgeon: Marea Selinda RAMAN, MD;  Location: [facility] INVASIVE CV LAB;  Service: Cardiovascular;  Laterality: Right;    Medications Prior to Admission  Medication Sig Dispense Refill Last Dose/Taking    ascorbic acid  (VITAMIN C ) 500 MG tablet Take 500 mg by mouth daily.   12/16/2023 Evening   carvedilol  (COREG ) 6.25 MG tablet Take 6.25 mg by mouth 2 (two) times daily with a meal.   12/16/2023 Evening   cetirizine (ZYRTEC) 10 MG tablet Take 10 mg by mouth daily.   12/16/2023 Evening   furosemide  (LASIX ) 40 MG tablet Take 40 mg by mouth daily.   12/17/2023 Morning   Lactobacillus-Inulin (CULTURELLE DIGESTIVE HEALTH) CAPS Take by mouth.   Unknown   aspirin EC 81 MG tablet Take 81 mg by mouth daily. Swallow whole. (Patient not taking: Reported on 12/04/2023)      Social History   Socioeconomic History   Marital status: Widowed    Spouse name: Not on file   Number of children: Not on file   Years of education: Not on file   Highest education level: Not on file  Occupational History   Occupation: retired  Tobacco Use   Smoking status: Former   Smokeless tobacco: Never  Substance and Sexual Activity   Alcohol use: No    Comment: wine   Drug use: No   Sexual activity: Never    Birth control/protection: None  Other Topics Concern   Not on file  Social History Narrative   Not on file   Social Drivers of Health   Financial Resource Strain: Patient Declined (12/20/2022)   Received from [facility]   Overall Financial Resource Strain (CARDIA)    Difficulty of Paying Living Expenses: Patient declined  Food Insecurity: No Food Insecurity (12/18/2023)   Hunger Vital Sign  Worried About Programme researcher, broadcasting/film/video in the Last Year: Never true    Ran Out of Food in the Last Year: Never true  Transportation Needs: No Transportation Needs (12/18/2023)   PRAPARE - Administrator, Civil Service (Medical): No    Lack of Transportation (Non-Medical): No  Physical Activity: Not on file  Stress: Not on file  Social Connections: Moderately Isolated (12/18/2023)   Social Connection and Isolation Panel    Frequency of Communication with Friends and Family: More than three times a  week    Frequency of Social Gatherings with Friends and Family: Three times a week    Attends Religious Services: 1 to 4 times per year    Active Member of Clubs or Organizations: No    Attends Banker Meetings: Never    Marital Status: Widowed  Intimate Partner Violence: Not At Risk (12/18/2023)   Humiliation, Afraid, Rape, and Kick questionnaire    Fear of Current or Ex-Partner: No    Emotionally Abused: No    Physically Abused: No    Sexually Abused: No    History reviewed. No pertinent family history.   Vitals:   12/19/23 0500 12/19/23 0743 12/19/23 1052 12/19/23 1056  BP:  (!) 118/50 (!) 91/37 (!) 93/36  Pulse:  94 94 87  Resp:      Temp:  97.7 F (36.5 C) 98.2 F (36.8 C)   TempSrc:   Oral   SpO2:   93%   Weight: 68.3 kg     Height:        PHYSICAL EXAM General: Well-appearing elderly female, well nourished, in no acute distress. HEENT: Normocephalic and atraumatic. Neck: Elevated JVD.   Lungs: Normal respiratory effort on 2L.  Diminished breath sounds bilaterally Heart: HRRR. Normal S1 and S2 without gallops or murmurs.  Abdomen: Non-distended appearing.  Msk: Normal strength and tone for age. Extremities: Warm and well perfused. No clubbing, cyanosis, Trace pedal edema (near baseline).  Neuro: Alert and oriented X 3. Psych: Answers questions appropriately.   Labs: Basic Metabolic Panel: Recent Labs    12/18/23 0931 12/19/23 0433  NA 140 140  K 3.8 3.5  CL 92* 91*  CO2 34* 37*  GLUCOSE 114* 116*  BUN 16 20  CREATININE 0.62 0.59  CALCIUM  9.4 8.9   Liver Function Tests: No results for input(s): AST, ALT, ALKPHOS, BILITOT, PROT, ALBUMIN in the last 72 hours. No results for input(s): LIPASE, AMYLASE in the last 72 hours. CBC: Recent Labs    12/18/23 0931 12/19/23 0433  WBC 8.2 8.2  NEUTROABS 6.5  --   HGB 15.5* 14.8  HCT 48.7* 48.3*  MCV 91.4 93.6  PLT 232 203   Cardiac Enzymes: Recent Labs    12/17/23 1315  12/17/23 1648  TROPONINIHS 10 10   BNP: Recent Labs    12/17/23 1315  BNP 453.6*   D-Dimer: No results for input(s): DDIMER in the last 72 hours. Hemoglobin A1C: No results for input(s): HGBA1C in the last 72 hours. Fasting Lipid Panel: No results for input(s): CHOL, HDL, LDLCALC, TRIG, CHOLHDL, LDLDIRECT in the last 72 hours. Thyroid Function Tests: No results for input(s): TSH, T4TOTAL, T3FREE, THYROIDAB in the last 72 hours.  Invalid input(s): FREET3 Anemia Panel: No results for input(s): VITAMINB12, FOLATE, FERRITIN, TIBC, IRON, RETICCTPCT in the last 72 hours.   Radiology: ECHOCARDIOGRAM COMPLETE Result Date: 12/18/2023    ECHOCARDIOGRAM REPORT   Patient Name:   Gloria Grimes Date of Exam:  12/18/2023 Medical Rec #:  [id number]      Height:       62.0 in Accession #:    [id number]     Weight:       151.2 lb Date of Birth:  [birth year]      BSA:          1.698 m Patient Age:    89 years       BP:           138/59 mmHg Patient Gender: F              HR:           93 bpm. Exam Location:  [facility] Procedure: 2D Echo, Cardiac Doppler and Color Doppler (Both Spectral and Color            Flow Doppler were utilized during procedure). Indications:     CHF-acute diastolic I50.31  History:         Patient has no prior history of Echocardiogram examinations.                  CHF. Cancer.  Sonographer:     Christopher Furnace Referring Phys:  [id number] [name] Diagnosing Phys: [name] MD  Sonographer Comments: Suboptimal parasternal window and suboptimal apical window. Best view is subcostal. IMPRESSIONS  1. Left ventricular ejection fraction, by estimation, is 60 to 65%. The left ventricle has normal function. The left ventricle has no regional wall motion abnormalities. Left ventricular diastolic parameters are consistent with Grade I diastolic dysfunction (impaired relaxation).  2. Right ventricular systolic function is normal. The right ventricular size is  normal.  3. The mitral valve is normal in structure. Mild mitral valve regurgitation. No evidence of mitral stenosis.  4. The aortic valve is normal in structure. Aortic valve regurgitation is not visualized. No aortic stenosis is present.  5. The inferior vena cava is normal in size with greater than 50% respiratory variability, suggesting right atrial pressure of 3 mmHg. FINDINGS  Left Ventricle: Left ventricular ejection fraction, by estimation, is 60 to 65%. The left ventricle has normal function. The left ventricle has no regional wall motion abnormalities. Strain was performed and the global longitudinal strain is indeterminate. The left ventricular internal cavity size was normal in size. There is no left ventricular hypertrophy. Left ventricular diastolic parameters are consistent with Grade I diastolic dysfunction (impaired relaxation). Right Ventricle: The right ventricular size is normal. No increase in right ventricular wall thickness. Right ventricular systolic function is normal. Left Atrium: Left atrial size was normal in size. Right Atrium: Right atrial size was normal in size. Pericardium: There is no evidence of pericardial effusion. Mitral Valve: The mitral valve is normal in structure. Mild mitral valve regurgitation. No evidence of mitral valve stenosis. MV peak gradient, 6.5 mmHg. The mean mitral valve gradient is 3.0 mmHg. Tricuspid Valve: The tricuspid valve is normal in structure. Tricuspid valve regurgitation is mild . No evidence of tricuspid stenosis. Aortic Valve: The aortic valve is normal in structure. Aortic valve regurgitation is not visualized. No aortic stenosis is present. Aortic valve mean gradient measures 3.0 mmHg. Aortic valve peak gradient measures 5.4 mmHg. Aortic valve area, by VTI measures 2.36 cm. Pulmonic Valve: The pulmonic valve was normal in structure. Pulmonic valve regurgitation is not visualized. No evidence of pulmonic stenosis. Aorta: The aortic root is normal  in size and structure. Venous: The inferior vena cava is normal in size with greater than 50% respiratory variability,  suggesting right atrial pressure of 3 mmHg. IAS/Shunts: No atrial level shunt detected by color flow Doppler. Additional Comments: 3D was performed not requiring image post processing on an independent workstation and was indeterminate.  LEFT VENTRICLE PLAX 2D LVIDd:         4.30 cm LVIDs:         2.70 cm LV PW:         1.00 cm LV IVS:        1.00 cm LVOT diam:     2.00 cm LV SV:         53 LV SV Index:   31 LVOT Area:     3.14 cm  RIGHT VENTRICLE RV Basal diam:  3.90 cm RV Mid diam:    2.80 cm LEFT ATRIUM             Index        RIGHT ATRIUM           Index LA diam:        3.10 cm 1.83 cm/m   RA Area:     15.90 cm LA Vol (A2C):   32.1 ml 18.91 ml/m  RA Volume:   42.10 ml  24.80 ml/m LA Vol (A4C):   27.1 ml 15.96 ml/m LA Biplane Vol: 31.2 ml 18.38 ml/m  AORTIC VALVE AV Area (Vmax):    2.22 cm AV Area (Vmean):   1.92 cm AV Area (VTI):     2.36 cm AV Vmax:           116.50 cm/s AV Vmean:          85.650 cm/s AV VTI:            0.225 m AV Peak Grad:      5.4 mmHg AV Mean Grad:      3.0 mmHg LVOT Vmax:         82.20 cm/s LVOT Vmean:        52.400 cm/s LVOT VTI:          0.169 m LVOT/AV VTI ratio: 0.75  AORTA Ao Root diam: 2.40 cm MITRAL VALVE                TRICUSPID VALVE MV Area (PHT): 4.36 cm     TR Peak grad:   26.6 mmHg MV Area VTI:   2.34 cm     TR Vmax:        258.00 cm/s MV Peak grad:  6.5 mmHg MV Mean grad:  3.0 mmHg     SHUNTS MV Vmax:       1.27 m/s     Systemic VTI:  0.17 m MV Vmean:      84.8 cm/s    Systemic Diam: 2.00 cm MV Decel Time: 174 msec MV E velocity: 65.00 cm/s MV A velocity: 104.00 cm/s MV E/A ratio:  0.62 [name] MD Electronically signed by [name] MD Signature Date/Time: 12/18/2023/1:26:03 PM    Final    DG Chest 2 View Result Date: 12/17/2023 CLINICAL DATA:  Shortness of breath. EXAM: CHEST - 2 VIEW COMPARISON:  CT chest 03/05/2020 and  chest radiograph 11/25/2019. FINDINGS: Trachea is midline. Heart size stable. Thoracic aorta is calcified. Chronic right middle lobe collapse lungs are otherwise clear. Trace bilateral pleural effusions. IMPRESSION: 1. Trace bilateral pleural effusions. 2. Chronic right middle lobe collapse. Electronically Signed   By: Newell Eke M.D.   On: 12/17/2023 13:25    ECHO as above.  TELEMETRY reviewed by  me 12/19/2023: Sinus rhythm rate 90s  EKG reviewed by me: Sinus rhythm, rate 86 bpm without acute ischemic changes.  Data reviewed by me 12/19/2023: last 24h vitals tele labs imaging I/O hospitalist progress notes.  Principal Problem:   CHF (congestive heart failure) (HCC)    ASSESSMENT AND PLAN:   Gloria Grimes is a 89 y.o. female  with a past medical history of chronic HFpEF, hypertension, PVD, cervical cancer, recent cataracts surgery of right eye 1 week ago who presented to the ED on 12/17/2023 for worsening shortness of breath for the past week, and endorses orthopnea.  Patient denies chest pain.  Cardiology was consulted for further evaluation of acute on chronic HFpEF.  # Acute on chronic HFpEF # Hypertension Patient with worsening SOB. NP elevated at 450.  CXR with trace bilateral pleural effusions, pulmonary vascular congestion.  Echo this admission with preserved EF of 60-65%, no RWMA, grade 1 diastolic dysfunction, mild MR.  Of note patient states she has lot of medication allergies and does not like starting new ones due to this.  Patient has been refusing losartan  and spironolactone , had long discussion regarding these heart failure related medications and patient is now agreeable. - Continue IV Lasix  40 mg twice daily.  Closely monitor UOP and renal function. - Decreased losartan   to 12.5 mg daily. Will uptitrate as BP allows - Continue Coreg  6.25 mg twice daily. - Ordered spironolactone  12.5 mg daily.  Will uptitrate as BP allows - Would like to initiate Farxiga, however co-pay is  high at $150.  Patient prefers we do not initiate. - Recommend patient ambulate today.  This patient's plan of care was discussed and created with Dr. Ammon and he is in agreement.  Signed: Dorene Comfort, PA-C  12/19/2023, 1:02 PM [facility]

## 2023-12-19 NOTE — Progress Notes (Addendum)
 PROGRESS NOTE    Gloria Grimes  FMW:[id number] DOB: 30-Aug-1930 DOA: 12/17/2023 PCP: Valora Agent, MD  Chief Complaint  Patient presents with   Shortness of Breath    Hospital Course:  [name] is a 89 year old female with history of cervical cancer, CHF with preserved EF, recent cataract surgery involving the right eye, who presented with progressive shortness of breath and PND.  On arrival patient was found to be hypoxic to 80%, BNP 453, CXR with vascular congestion and chronic right middle lung collapse.  Patient was admitted for acute on chronic diastolic heart failure exacerbation  Subjective: She reports she is feeling much better this morning though generalized weakness is persistent.  She is anxious to get out of bed.  She reports she is usually much more ambulatory at home. Her son is at bedside and reports that this is the best her legs have ever looked.  He reports they are chronically swollen.   Objective: Vitals:   12/19/23 0743 12/19/23 1052 12/19/23 1056 12/19/23 1548  BP: (!) 118/50 (!) 91/37 (!) 93/36 (!) 110/92  Pulse: 94 94 87 86  Resp:      Temp: 97.7 F (36.5 C) 98.2 F (36.8 C)  98.1 F (36.7 C)  TempSrc:  Oral    SpO2:  93%  100%  Weight:      Height:        Intake/Output Summary (Last 24 hours) at 12/19/2023 1643 Last data filed at 12/19/2023 1300 Gross per 24 hour  Intake 1110 ml  Output 1200 ml  Net -90 ml   Filed Weights   12/17/23 2300 12/18/23 0500 12/19/23 0500  Weight: 69.3 kg 68.6 kg 68.3 kg    Examination: General exam: Appears calm and comfortable, NAD  Respiratory system: No work of breathing, symmetric chest wall expansion, crackles left lung base Cardiovascular system: S1 & S2 heard, RRR.  Gastrointestinal system: Abdomen is nondistended, soft and nontender.  Neuro: Alert and oriented. No focal neurological deficits. Extremities: 1+ edema bilaterally Skin: No rashes, lesions Psychiatry: Demonstrates appropriate judgement  and insight. Mood & affect appropriate for situation.   Assessment & Plan:  Principal Problem:   CHF (congestive heart failure) (HCC)   Acute on chronic diastolic CHF - BNP elevated to 453 on arrival - Echo this admission: LVEF 60 to 65%, no RWMA, grade 1 diastolic dysfunction.  No significant valvular disease noted. - Continue supplemental O2 and wean as tolerated - Continue with IV Lasix  - Continue strict I's and O's - Currently on losartan  and Coreg , decreasing these doses to allow for better diuresis - Continue GDMT as BP tolerates - Cardiology consulted  Hypoxia - Secondary to vascular congestion. - Continue weaning O2 - Patient does have noted chronic right middle lung collapse which may be playing a role the patient was stable on room air and her recent cataract surgery - PT evals  Chronic right middle lung collapse - Encourage incentive spirometry  Hypertension - Resume meds and titrate as needed  DVT prophylaxis: Lovenox    Code Status: Limited: Do not attempt resuscitation (DNR) -DNR-LIMITED -Do Not Intubate/DNI  Disposition: Inpatient pending further diuresis and weaning off O2.  Likely discharge with home health versus rehab back to her living facility  Consultants:  Treatment Team:  Consulting Physician: Ammon Blunt, MD  Procedures:    Antimicrobials:  Anti-infectives (From admission, onward)    None       Data Reviewed: I have personally reviewed following labs and imaging studies CBC:  Recent Labs  Lab 12/17/23 1315 12/18/23 0931 12/19/23 0433  WBC 8.8 8.2 8.2  NEUTROABS  --  6.5  --   HGB 15.7* 15.5* 14.8  HCT 48.9* 48.7* 48.3*  MCV 88.7 91.4 93.6  PLT 234 232 203   Basic Metabolic Panel: Recent Labs  Lab 12/17/23 1315 12/18/23 0931 12/19/23 0433  NA 137 140 140  K 4.4 3.8 3.5  CL 91* 92* 91*  CO2 34* 34* 37*  GLUCOSE 109* 114* 116*  BUN 19 16 20   CREATININE 0.64 0.62 0.59  CALCIUM  9.7 9.4 8.9   GFR: Estimated  Creatinine Clearance: 41.5 mL/min (by C-G formula based on SCr of 0.59 mg/dL). Liver Function Tests: No results for input(s): AST, ALT, ALKPHOS, BILITOT, PROT, ALBUMIN in the last 168 hours. CBG: No results for input(s): GLUCAP in the last 168 hours.  No results found for this or any previous visit (from the past 240 hours).   Radiology Studies: ECHOCARDIOGRAM COMPLETE Result Date: 12/18/2023    ECHOCARDIOGRAM REPORT   Patient Name:   Gloria Grimes Date of Exam: 12/18/2023 Medical Rec #:  [id number]      Height:       62.0 in Accession #:    [id number]     Weight:       151.2 lb Date of Birth:  1931/01/01      BSA:          1.698 m Patient Age:    89 years       BP:           138/59 mmHg Patient Gender: F              HR:           93 bpm. Exam Location:  [facility] Procedure: 2D Echo, Cardiac Doppler and Color Doppler (Both Spectral and Color            Flow Doppler were utilized during procedure). Indications:     CHF-acute diastolic I50.31  History:         Patient has no prior history of Echocardiogram examinations.                  CHF. Cancer.  Sonographer:     Christopher Furnace Referring Phys:  [id number] [name] Diagnosing Phys: [name] MD  Sonographer Comments: Suboptimal parasternal window and suboptimal apical window. Best view is subcostal. IMPRESSIONS  1. Left ventricular ejection fraction, by estimation, is 60 to 65%. The left ventricle has normal function. The left ventricle has no regional wall motion abnormalities. Left ventricular diastolic parameters are consistent with Grade I diastolic dysfunction (impaired relaxation).  2. Right ventricular systolic function is normal. The right ventricular size is normal.  3. The mitral valve is normal in structure. Mild mitral valve regurgitation. No evidence of mitral stenosis.  4. The aortic valve is normal in structure. Aortic valve regurgitation is not visualized. No aortic stenosis is present.  5. The inferior vena cava is normal  in size with greater than 50% respiratory variability, suggesting right atrial pressure of 3 mmHg. FINDINGS  Left Ventricle: Left ventricular ejection fraction, by estimation, is 60 to 65%. The left ventricle has normal function. The left ventricle has no regional wall motion abnormalities. Strain was performed and the global longitudinal strain is indeterminate. The left ventricular internal cavity size was normal in size. There is no left ventricular hypertrophy. Left ventricular diastolic parameters are consistent with Grade I diastolic dysfunction (impaired relaxation).  Right Ventricle: The right ventricular size is normal. No increase in right ventricular wall thickness. Right ventricular systolic function is normal. Left Atrium: Left atrial size was normal in size. Right Atrium: Right atrial size was normal in size. Pericardium: There is no evidence of pericardial effusion. Mitral Valve: The mitral valve is normal in structure. Mild mitral valve regurgitation. No evidence of mitral valve stenosis. MV peak gradient, 6.5 mmHg. The mean mitral valve gradient is 3.0 mmHg. Tricuspid Valve: The tricuspid valve is normal in structure. Tricuspid valve regurgitation is mild . No evidence of tricuspid stenosis. Aortic Valve: The aortic valve is normal in structure. Aortic valve regurgitation is not visualized. No aortic stenosis is present. Aortic valve mean gradient measures 3.0 mmHg. Aortic valve peak gradient measures 5.4 mmHg. Aortic valve area, by VTI measures 2.36 cm. Pulmonic Valve: The pulmonic valve was normal in structure. Pulmonic valve regurgitation is not visualized. No evidence of pulmonic stenosis. Aorta: The aortic root is normal in size and structure. Venous: The inferior vena cava is normal in size with greater than 50% respiratory variability, suggesting right atrial pressure of 3 mmHg. IAS/Shunts: No atrial level shunt detected by color flow Doppler. Additional Comments: 3D was performed not  requiring image post processing on an independent workstation and was indeterminate.  LEFT VENTRICLE PLAX 2D LVIDd:         4.30 cm LVIDs:         2.70 cm LV PW:         1.00 cm LV IVS:        1.00 cm LVOT diam:     2.00 cm LV SV:         53 LV SV Index:   31 LVOT Area:     3.14 cm  RIGHT VENTRICLE RV Basal diam:  3.90 cm RV Mid diam:    2.80 cm LEFT ATRIUM             Index        RIGHT ATRIUM           Index LA diam:        3.10 cm 1.83 cm/m   RA Area:     15.90 cm LA Vol (A2C):   32.1 ml 18.91 ml/m  RA Volume:   42.10 ml  24.80 ml/m LA Vol (A4C):   27.1 ml 15.96 ml/m LA Biplane Vol: 31.2 ml 18.38 ml/m  AORTIC VALVE AV Area (Vmax):    2.22 cm AV Area (Vmean):   1.92 cm AV Area (VTI):     2.36 cm AV Vmax:           116.50 cm/s AV Vmean:          85.650 cm/s AV VTI:            0.225 m AV Peak Grad:      5.4 mmHg AV Mean Grad:      3.0 mmHg LVOT Vmax:         82.20 cm/s LVOT Vmean:        52.400 cm/s LVOT VTI:          0.169 m LVOT/AV VTI ratio: 0.75  AORTA Ao Root diam: 2.40 cm MITRAL VALVE                TRICUSPID VALVE MV Area (PHT): 4.36 cm     TR Peak grad:   26.6 mmHg MV Area VTI:   2.34 cm     TR Vmax:  258.00 cm/s MV Peak grad:  6.5 mmHg MV Mean grad:  3.0 mmHg     SHUNTS MV Vmax:       1.27 m/s     Systemic VTI:  0.17 m MV Vmean:      84.8 cm/s    Systemic Diam: 2.00 cm MV Decel Time: 174 msec MV E velocity: 65.00 cm/s MV A velocity: 104.00 cm/s MV E/A ratio:  0.62 [name] MD Electronically signed by [name] MD Signature Date/Time: 12/18/2023/1:26:03 PM    Final     Scheduled Meds:  carvedilol   6.25 mg Oral BID WC   enoxaparin  (LOVENOX ) injection  40 mg Subcutaneous Q24H   furosemide   40 mg Intravenous BID   loratadine   10 mg Oral Daily   [START ON 12/20/2023] losartan   12.5 mg Oral Daily   Prednisolon-Moxiflox-Bromfenac   1 drop Right Eye TID   [START ON 12/20/2023] spironolactone   12.5 mg Oral Daily   Continuous Infusions:   LOS: 2 days  MDM: Patient is  high risk for one or more organ failure.  They necessitate ongoing hospitalization for continued IV therapies and subsequent lab monitoring. Total time spent interpreting labs and vitals, reviewing the medical record, coordinating care amongst consultants and care team members, directly assessing and discussing care with the patient and/or family: 55 min  Emmabelle Fear, DO [facility]  To contact the attending physician between 7A-7P please use Epic Chat. To contact the covering physician during after hours 7P-7A, please review Amion.  12/19/2023, 4:43 PM   *This document has been created with the assistance of dictation software. Please excuse typographical errors. *

## 2023-12-19 NOTE — TOC Progression Note (Signed)
 Transition of Care [facility]) - Progression Note    Patient Details  Name: Gloria Grimes MRN: [id number] Date of Birth: 13-Jun-1930  Transition of Care [facility]) CM/SW Contact  Tomasa JAYSON Childes, RN Phone Number: 12/19/2023, 4:21 PM  Clinical Narrative:    Retrieved call from Luke PIES at [facility] stating patient son is requesting SNF. Kim advised admissions would likely be private pay.                      Expected Discharge Plan and Services                                               Social Drivers of Health (SDOH) Interventions SDOH Screenings   Food Insecurity: No Food Insecurity (12/18/2023)  Housing: Low Risk  (12/18/2023)  Transportation Needs: No Transportation Needs (12/18/2023)  Utilities: Not At Risk (12/18/2023)  Financial Resource Strain: Patient Declined (12/20/2022)   Received from [facility]  Social Connections: Moderately Isolated (12/18/2023)  Tobacco Use: Medium Risk (12/17/2023)    Readmission Risk Interventions     No data to display

## 2023-12-19 NOTE — Care Management Important Message (Signed)
 Important Message  Patient Details  Name: Gloria Grimes MRN: [id number] Date of Birth: [birth year]   Important Message Given:  Yes - Medicare IM     [name] 12/19/2023, 12:35 PM

## 2023-12-19 NOTE — Progress Notes (Signed)
 Attempted to wean patient from 2L O2 [place] and patient noted to drop to 85% on 1L. Pt O2 turned up to 2L [place] and patient able to maintain 90% on 2L.

## 2023-12-20 ENCOUNTER — Inpatient Hospital Stay

## 2023-12-20 DIAGNOSIS — I5033 Acute on chronic diastolic (congestive) heart failure: Secondary | ICD-10-CM | POA: Diagnosis not present

## 2023-12-20 LAB — CBC
HCT: 46 % (ref 36.0–46.0)
Hemoglobin: 14.1 g/dL (ref 12.0–15.0)
MCH: 28.7 pg (ref 26.0–34.0)
MCHC: 30.7 g/dL (ref 30.0–36.0)
MCV: 93.5 fL (ref 80.0–100.0)
Platelets: 202 K/uL (ref 150–400)
RBC: 4.92 MIL/uL (ref 3.87–5.11)
RDW: 15 % (ref 11.5–15.5)
WBC: 7.1 K/uL (ref 4.0–10.5)
nRBC: 0 % (ref 0.0–0.2)

## 2023-12-20 LAB — BLOOD GAS, VENOUS
Acid-Base Excess: 23.6 mmol/L — ABNORMAL HIGH (ref 0.0–2.0)
Bicarbonate: 55.9 mmol/L — ABNORMAL HIGH (ref 20.0–28.0)
O2 Saturation: 98.1 %
Patient temperature: 37
pCO2, Ven: 106 mmHg (ref 44–60)
pH, Ven: 7.33 (ref 7.25–7.43)
pO2, Ven: 84 mmHg — ABNORMAL HIGH (ref 32–45)

## 2023-12-20 LAB — COMPREHENSIVE METABOLIC PANEL WITH GFR
ALT: 15 U/L (ref 0–44)
AST: 14 U/L — ABNORMAL LOW (ref 15–41)
Albumin: 3.2 g/dL — ABNORMAL LOW (ref 3.5–5.0)
Alkaline Phosphatase: 42 U/L (ref 38–126)
Anion gap: 9 (ref 5–15)
BUN: 18 mg/dL (ref 8–23)
CO2: 44 mmol/L — ABNORMAL HIGH (ref 22–32)
Calcium: 9.5 mg/dL (ref 8.9–10.3)
Chloride: 89 mmol/L — ABNORMAL LOW (ref 98–111)
Creatinine, Ser: 0.53 mg/dL (ref 0.44–1.00)
GFR, Estimated: >60 mL/min (ref >60)
Glucose, Bld: 149 mg/dL — ABNORMAL HIGH (ref 70–99)
Potassium: 3.9 mmol/L (ref 3.5–5.1)
Sodium: 142 mmol/L (ref 135–145)
Total Bilirubin: 0.9 mg/dL (ref 0.0–1.2)
Total Protein: 6 g/dL — ABNORMAL LOW (ref 6.5–8.1)

## 2023-12-20 LAB — BASIC METABOLIC PANEL WITH GFR
Anion gap: 11 (ref 5–15)
BUN: 17 mg/dL (ref 8–23)
CO2: 38 mmol/L — ABNORMAL HIGH (ref 22–32)
Calcium: 9.4 mg/dL (ref 8.9–10.3)
Chloride: 93 mmol/L — ABNORMAL LOW (ref 98–111)
Creatinine, Ser: 0.53 mg/dL (ref 0.44–1.00)
GFR, Estimated: >60 mL/min (ref >60)
Glucose, Bld: 114 mg/dL — ABNORMAL HIGH (ref 70–99)
Potassium: 4.2 mmol/L (ref 3.5–5.1)
Sodium: 142 mmol/L (ref 135–145)

## 2023-12-20 LAB — GLUCOSE, CAPILLARY: Glucose-Capillary: 147 mg/dL — ABNORMAL HIGH (ref 70–99)

## 2023-12-20 LAB — D-DIMER, QUANTITATIVE: D-Dimer, Quant: 0.37 ug{FEU}/mL (ref 0.00–0.50)

## 2023-12-20 MED ORDER — DM-GUAIFENESIN ER 30-600 MG PO TB12: 1.0000 | ORAL_TABLET | Freq: Two times a day (BID) | ORAL | Status: DC | PRN

## 2023-12-20 MED ORDER — CARVEDILOL 6.25 MG PO TABS
6.2500 mg | ORAL_TABLET | Freq: Two times a day (BID) | ORAL | Status: AC
  Administered 2023-12-20: 6.25 mg via ORAL
  Filled 2023-12-20: qty 1

## 2023-12-20 MED ORDER — METOPROLOL SUCCINATE ER 25 MG PO TB24
25.0000 mg | ORAL_TABLET | Freq: Every day | ORAL | Status: DC
  Administered 2023-12-21 – 2023-12-26 (×6): 25 mg via ORAL
  Filled 2023-12-20 (×6): qty 1

## 2023-12-20 NOTE — Progress Notes (Signed)
 Physical Therapy Treatment Patient Details Name: Gloria Grimes MRN: [id number] DOB: November 21, 1930 Today's Date: 12/20/2023   History of Present Illness Pt is a 89 y/o female admitted secondary to SOB and found to have acute on chronic HFpEF. PMH including but not limited to cervical cancer, CHF with EF greater than 55% based on echo in 2022 done at [facility].    PT Comments  Today's tx session involved continuation of ambulation with RW to improve activity tolerance. Pt SpO2 monitored throughout session, pt on 2L in supine, checked it in RA and remained at 93%, sat up and it dropped to 91%, stood and dropped to 80%, bumped pt up to 4L to stay above 90%. Ambulation distance limited this date to 43' d/t pt feeling weak, returned to bed. Pt currently showing a decline from yesterday's session and dependent on O2, therefore changing d/c recs to SNF, TOC contacted. PT to follow acutely as appropriate.    If plan is discharge home, recommend the following: A little help with walking and/or transfers;A little help with bathing/dressing/bathroom;Assistance with cooking/housework;Assist for transportation;Help with stairs or ramp for entrance   Can travel by private vehicle     Yes  Equipment Recommendations  Rolling walker (2 wheels)    Recommendations for Other Services       Precautions / Restrictions Precautions Precautions: Fall Recall of Precautions/Restrictions: Intact Restrictions Weight Bearing Restrictions Per Provider Order: No     Mobility  Bed Mobility Overal bed mobility: Modified Independent             General bed mobility comments: mod i with minimal verbal cues initially to initate mobility    Transfers Overall transfer level: Needs assistance Equipment used: Rolling walker (2 wheels) Transfers: Sit to/from Stand Sit to Stand: Contact guard assist           General transfer comment: CGA for transfer, educated on manipulation of RW, no LOB,  verbal/tactile cues    Ambulation/Gait Ambulation/Gait assistance: Contact guard assist Gait Distance (Feet): 40 Feet Assistive device: Rolling walker (2 wheels) Gait Pattern/deviations: Step-through pattern, Decreased stride length, Trunk flexed Gait velocity: decreased     General Gait Details: pt ambulating with a slow, steady gait with use of RW; unable to maintain safe proximity to RW despite cueing, no LOB, pt needing to return to room d/t feeling weak   Stairs             Wheelchair Mobility     Tilt Bed    Modified Rankin (Stroke Patients Only)       Balance Overall balance assessment: Needs assistance Sitting-balance support: Feet supported Sitting balance-Leahy Scale: Good Sitting balance - Comments: sitting EOB   Standing balance support: Bilateral upper extremity supported Standing balance-Leahy Scale: Fair Standing balance comment: use of RW                            Communication Communication Communication: Impaired Factors Affecting Communication: Hearing impaired  Cognition Arousal: Alert Behavior During Therapy: WFL for tasks assessed/performed   PT - Cognitive impairments: No apparent impairments                         Following commands: Intact      Cueing Cueing Techniques: Verbal cues, Tactile cues  Exercises      General Comments        Pertinent Vitals/Pain Pain Assessment Pain Assessment: No/denies pain  Home Living                          Prior Function            PT Goals (current goals can now be found in the care plan section) Acute Rehab PT Goals Patient Stated Goal: return home soon PT Goal Formulation: With patient Time For Goal Achievement: 01/02/24 Potential to Achieve Goals: Good Progress towards PT goals: Progressing toward goals    Frequency    Min 2X/week      PT Plan      Co-evaluation              AM-PAC PT 6 Clicks Mobility   Outcome  Measure  Help needed turning from your back to your side while in a flat bed without using bedrails?: None Help needed moving from lying on your back to sitting on the side of a flat bed without using bedrails?: None Help needed moving to and from a bed to a chair (including a wheelchair)?: A Little Help needed standing up from a chair using your arms (e.g., wheelchair or bedside chair)?: A Little Help needed to walk in hospital room?: A Little Help needed climbing 3-5 steps with a railing? : A Lot 6 Click Score: 19    End of Session Equipment Utilized During Treatment: Oxygen Activity Tolerance: Patient limited by fatigue Patient left: in bed;with call bell/phone within reach;with bed alarm set;with family/visitor present Nurse Communication: Mobility status PT Visit Diagnosis: Other abnormalities of gait and mobility (R26.89)     Time: 8651-8595 PT Time Calculation (min) (ACUTE ONLY): 16 min  Charges:                               Mitsue Peery Romero-Perozo, SPT  12/20/2023, 2:20 PM

## 2023-12-20 NOTE — Progress Notes (Signed)
 Rapid Response Event Note   Reason for Call :  Decrease alertness, not responding  Initial Focused Assessment:   Neuro assessment by [name] NP at bedside    Interventions:  ABG, labs, CT  Plan of Care:   Remain on 2A until results return then re-evaluate if needed  Event Summary:   MD Notified: at bedside Call Time: Arrival Time: End Time:  Suzen LITTIE Primer, RN

## 2023-12-20 NOTE — Significant Event (Signed)
       CROSS COVER NOTE  NAME: Gloria Grimes MRN: [id number] DOB : [birth year] ATTENDING PHYSICIAN: Dezii, Alexandra, DO    Date of Service   12/20/2023   HPI/Events of Note   Rapid response called for patient with altered mental status  HPI admitted with CHF exacerbation with BNP 453 EF 60-65% grade 1 diastolic dysfunction. Hypoxia on admission (also intermittently chronic) felt to be secondary to fluid overload. Also has Emphysema and chonic right middle lobe collapse   Interventions   Assessment/Plan:    12/20/2023    9:56 PM 12/20/2023    8:25 PM 12/20/2023    4:04 PM  Vitals with BMI  Systolic 115 115 875  Diastolic 61 46 49  Pulse 88 85 90   Patient lethargic responded when i called her name . Speech slightly garbled initially cleared as more awake. Was able to follow commands. No focal deficits.  VBG  Latest Reference Range & Units 12/20/23 22:18  pH, Ven 7.25 - 7.43  7.33  pCO2, Ven 44 - 60 mmHg 106 (HH)  pO2, Ven 32 - 45 mmHg 84 (H)  Acid-Base Excess 0.0 - 2.0 mmol/L 23.6 (H)  Bicarbonate 20.0 - 28.0 mmol/L 55.9 (H)  O2 Saturation % 98.1  Patient temperature  37.0  Collection site  VEIN    Latest Reference Range & Units 12/20/23 22:17  Sodium 135 - 145 mmol/L 142  Potassium 3.5 - 5.1 mmol/L 3.9  Chloride 98 - 111 mmol/L 89 (L)  CO2 22 - 32 mmol/L 44 (H)  Glucose 70 - 99 mg/dL 850 (H)  BUN 8 - 23 mg/dL 18  Creatinine 9.55 - 8.99 mg/dL 9.46  Calcium  8.9 - 10.3 mg/dL 9.5  Anion gap 5 - 15  9  Alkaline Phosphatase 38 - 126 U/L 42  Albumin 3.5 - 5.0 g/dL 3.2 (L)  AST 15 - 41 U/L 14 (L)  ALT 0 - 44 U/L 15  Total Protein 6.5 - 8.1 g/dL 6.0 (L)  Total Bilirubin 0.0 - 1.2 mg/dL 0.9  GFR, Estimated >39 mL/min >60  (L): Data is abnormally low (H): Data is abnormally high  EXAM: CT HEAD WITHOUT CONTRAST IMPRESSION: No acute abnormality.   EXAM: CT CHEST WITHOUT CONTRAST 12/21/2023 01:48:42 AM   IMPRESSION: 1. No acute findings. 2. Trace left pleural  effusion with left basilar scarring/atelectasis. 3. Complete right middle lobe collapse, chronic.   Latest Reference Range & Units 12/20/23 22:17  D-Dimer, Quant 0.00 - 0.50 ug/mL-FEU 0.37    Acute respiratory failure worsening now with hypercapnea multifactorial  - patient with chronic emphysema in weakened state several days of IV loop diuresis contributing to severe metabolic alkalosis;  Giving age cannot rule out FTT either as a normal trajectory for her or other cause    BIPAP repeat vbg 0400 Dicontinue IV lasix  - ordered one time diamox for am [place] tsh with am labs  Daughter called and updated regarding physical findings labs and plan of care      Erminio LITTIE Cone NP [facility] Cross Cover 7pm-7am - check amion for availability Pager [phone]

## 2023-12-20 NOTE — Consult Note (Signed)
 WOC Nurse Consult Note: Reason for Consult: Electrical engineer boot bilateral legs to treat swelling. No open wound. Pressure Injury POA: NA Dressing procedure/placement/frequency: Applied Unna boot, wrap with Coban.  Notes: Edema 2+/1+ on the left side, 1+/1+ on the right. Teleangiectasis present.  Change in 7 days. The WOC team will follow.  Please reconsult if further assistance is needed. Thank-you,  Lela Holm BSN, CNS, RN, [employer], WOCN  (Phone [phone])

## 2023-12-20 NOTE — Progress Notes (Signed)
 Pt VBG results  pH 7.33 pCo2 106 pO2 84 HCO3  55.6  [facility] will not accept results at this time. Notified NP [name] per secure chat.

## 2023-12-20 NOTE — Progress Notes (Signed)
 PROGRESS NOTE    [name]  FMW:[id number] DOB: June 07, 1930 DOA: 12/17/2023 PCP: Valora Agent, MD  Chief Complaint  Patient presents with   Shortness of Breath    Hospital Course:  Gloria Grimes is a 89 year old female with history of cervical cancer, CHF with preserved EF, recent cataract surgery involving the right eye, who presented with progressive shortness of breath and PND.  On arrival patient was found to be hypoxic to 80%, BNP 453, CXR with vascular congestion and chronic right middle lung collapse.  Patient was admitted for acute on chronic diastolic heart failure exacerbation  Subjective: Patient is increasingly dyspneic today.  She demonstrates desaturation with minimal exertion.  Also requiring oxygen at rest.  She also reports that her lower extremities appear to be more swollen.  Her son, at bedside, endorses concerns of acute confusion.  Objective: Vitals:   12/20/23 0402 12/20/23 0459 12/20/23 1117 12/20/23 1604  BP: (!) 123/46  (!) 103/43 (!) 124/49  Pulse: 97  86 90  Resp: 17     Temp: 99.3 F (37.4 C)  98.2 F (36.8 C) 98.1 F (36.7 C)  TempSrc: Oral     SpO2: 92%  92% 97%  Weight:  73 kg    Height:        Intake/Output Summary (Last 24 hours) at 12/20/2023 1612 Last data filed at 12/20/2023 1547 Gross per 24 hour  Intake 110 ml  Output 1100 ml  Net -990 ml   Filed Weights   12/18/23 0500 12/19/23 0500 12/20/23 0459  Weight: 68.6 kg 68.3 kg 73 kg    Examination: General exam: Appears calm and comfortable, NAD  Respiratory system: No work of breathing, symmetric chest wall expansion, crackles left lung base Cardiovascular system: S1 & S2 heard, RRR.  Gastrointestinal system: Abdomen is nondistended, soft and nontender.  Neuro: Alert and oriented. No focal neurological deficits. Extremities: 1+ edema bilaterally, no significant erythema or tenderness to palpation in either calf Skin: No rashes, lesions Psychiatry: Demonstrates appropriate  judgement and insight. Mood & affect appropriate for situation.   Assessment & Plan:  Principal Problem:   CHF (congestive heart failure) (HCC)   Acute on chronic diastolic CHF - BNP elevated to 453 on arrival - Echo this admission: LVEF 60 to 65%, no RWMA, grade 1 diastolic dysfunction.  No significant valvular disease noted. - Continue with IV Lasix  - Continue strict I's and O's - Continue with losartan , though in setting of preserved EF can consider discontinuation to allow for greater diuresis -Cardiology following  Hypoxia - This is multifactorial.  Patient does have noted chronic right middle lung collapse.  Has previously seen pulmonology Dr. Aleskerov.  Has previously required home O2 in the past.  As recently as 7/15 patient was stable on room air for cataract surgery though her daughter does note some desaturation postop.  Presently acute hypoxia appears to be worsened by vascular congestion, atelectasis, and mild pleural effusions.  We will continue with diuresis. - Continue weaning oxygen as tolerated - We will consider home O2 if patient appears to be euvolemic and still dyspneic with exertion.  Lower extremity swelling - This is chronic.  In some part exacerbated by acute CHF exacerbation.  Patient does have stents in the right leg and is followed by [facility]. - Doppler ordered by Cardiology PA to assess for DVT today.  Still pending results.  No impressive tenderness or erythema on my exam.  Chronic right middle lung collapse - Encourage incentive spirometry and  flutter valve - As needed DuoNebs - Mucinex  DM for mucous plugging  Tobacco abuse - Quit smoking 40 years prior.  Hypertension - Resume meds and titrate as needed  Generalized weakness - Physical therapy currently recommending SNF.  TOC has been consulted to assist in these arrangements  DVT prophylaxis: Lovenox    Code Status: Limited: Do not attempt resuscitation (DNR) -DNR-LIMITED -Do Not Intubate/DNI   Disposition: Physical therapy currently recommending SNF  Consultants:  Treatment Team:  Consulting Physician: Ammon Blunt, MD  Procedures:    Antimicrobials:  Anti-infectives (From admission, onward)    None       Data Reviewed: I have personally reviewed following labs and imaging studies CBC: Recent Labs  Lab 12/17/23 1315 12/18/23 0931 12/19/23 0433 12/20/23 0312  WBC 8.8 8.2 8.2 7.1  NEUTROABS  --  6.5  --   --   HGB 15.7* 15.5* 14.8 14.1  HCT 48.9* 48.7* 48.3* 46.0  MCV 88.7 91.4 93.6 93.5  PLT 234 232 203 202   Basic Metabolic Panel: Recent Labs  Lab 12/17/23 1315 12/18/23 0931 12/19/23 0433 12/20/23 0312  NA 137 140 140 142  K 4.4 3.8 3.5 4.2  CL 91* 92* 91* 93*  CO2 34* 34* 37* 38*  GLUCOSE 109* 114* 116* 114*  BUN 19 16 20 17   CREATININE 0.64 0.62 0.59 0.53  CALCIUM  9.7 9.4 8.9 9.4   GFR: Estimated Creatinine Clearance: 42.9 mL/min (by C-G formula based on SCr of 0.53 mg/dL). Liver Function Tests: No results for input(s): AST, ALT, ALKPHOS, BILITOT, PROT, ALBUMIN in the last 168 hours. CBG: No results for input(s): GLUCAP in the last 168 hours.  No results found for this or any previous visit (from the past 240 hours).   Radiology Studies: DG Chest Port 1 View Result Date: 12/20/2023 CLINICAL DATA:  Hypoxia.  Congestive heart failure. EXAM: PORTABLE CHEST 1 VIEW COMPARISON:  12/17/2023 FINDINGS: Stable heart size and mediastinal contours. Increase in peribronchial thickening. There may be small pleural effusions. Atelectasis at the lung bases, more so on the left. No pneumothorax IMPRESSION: 1. Increase in peribronchial thickening, may represent bronchitis or pulmonary edema. 2. Possible small pleural effusions. Bibasilar atelectasis. Electronically Signed   By: Andrea Gasman M.D.   On: 12/20/2023 14:21    Scheduled Meds:  carvedilol   6.25 mg Oral BID WC   enoxaparin  (LOVENOX ) injection  40 mg Subcutaneous Q24H    furosemide   40 mg Intravenous BID   loratadine   10 mg Oral Daily   losartan   12.5 mg Oral Daily   [START ON 12/21/2023] metoprolol  succinate  25 mg Oral Daily   Prednisolon-Moxiflox-Bromfenac   1 drop Right Eye TID   spironolactone   12.5 mg Oral Daily   Continuous Infusions:   LOS: 3 days  MDM: Patient is high risk for one or more organ failure.  They necessitate ongoing hospitalization for continued IV therapies and subsequent lab monitoring. Total time spent interpreting labs and vitals, reviewing the medical record, coordinating care amongst consultants and care team members, directly assessing and discussing care with the patient and/or family: 55 min  Juleah Paradise, DO [facility]  To contact the attending physician between 7A-7P please use Epic Chat. To contact the covering physician during after hours 7P-7A, please review Amion.  12/20/2023, 4:12 PM   *This document has been created with the assistance of dictation software. Please excuse typographical errors. *

## 2023-12-20 NOTE — Consult Note (Addendum)
 WOC Nurse Consult Note: Reason for Consult: requested to apply Unna boot bilateral. Wound type: Venous insufficiency. Pressure Injury POA: NA  I spoke with the nurse, and the pt will do the US  before the application.  The WOC team is aware and has added the patient to the following list. Plans to apply Unna boot when the exam is ready, after 1300.  Thanks to [facility] for all support.  Thank-you,  Lela Holm BSN, CNS, RN, [employer], WOCN  (Phone [phone])

## 2023-12-20 NOTE — Progress Notes (Signed)
 SATURATION QUALIFICATIONS: (This note is used to comply with regulatory documentation for home oxygen)  Patient Saturations on Room Air at Rest = 88%  Patient Saturations on Room Air while Ambulating = 75%  Patient Saturations on 2 Liters of oxygen while Ambulating = 90%  Please briefly explain why patient needs home oxygen: unable to maintain adequate O2 saturations on Room air.

## 2023-12-20 NOTE — Progress Notes (Signed)
 [facility] CLINIC CARDIOLOGY PROGRESS NOTE       Patient ID: Gloria Grimes MRN: [id number] DOB/AGE: 1930/06/18 89 y.o.  Admit date: 12/17/2023 Referring Physician dr. Drue Potter Primary Physician Valora Agent, MD Primary Cardiologist Dr. Ammon Reason for Consultation CHF exacerbation   HPI: Gloria Grimes is a 89 y.o. female  with a past medical history of chronic HFpEF, hypertension, PVD, cervical cancer, recent cataracts surgery of right eye 1 week ago who presented to the ED on 12/17/2023 for worsening shortness of breath for the past week, and endorses orthopnea.  Patient denies chest pain.  Cardiology was consulted for further evaluation of acute on chronic HFpEF.  Interval History: -Patient seen and examined this AM and laying comfortably in hospital bed with son at bedside, laying nearly flat. Patient states she feels okay today. Reports breathing getting better and ambulated well with O2 yesterday. Continues to deny chest pain, palpitations or lightheadedness.  -LEE worsening today, patient states feel tight and reports pain with palpation. -Patients BP and HR stable this AM. Overnight Tele showed no significant events.  -Yesterday UOP 1.1L, with stable renal function. -Patient remains on 2L with stable SpO2.    Review of systems complete and found to be negative unless listed above    Past Medical History:  Diagnosis Date   Cancer (HCC)    ovarian   CHF (congestive heart failure) (HCC)     Past Surgical History:  Procedure Laterality Date   ABDOMINAL HYSTERECTOMY     CATARACT EXTRACTION W/PHACO Right 12/12/2023   Procedure: PHACOEMULSIFICATION, CATARACT, WITH IOL INSERTION 12.79 01:03.7;  Surgeon: Mittie Gaskin, MD;  Location: [facility] ORS;  Service: Ophthalmology;  Laterality: Right;   LOWER EXTREMITY ANGIOGRAPHY Right 12/16/2020   Procedure: LOWER EXTREMITY ANGIOGRAPHY;  Surgeon: Marea Selinda RAMAN, MD;  Location: [facility] INVASIVE CV LAB;  Service: Cardiovascular;   Laterality: Right;    Medications Prior to Admission  Medication Sig Dispense Refill Last Dose/Taking   ascorbic acid  (VITAMIN C ) 500 MG tablet Take 500 mg by mouth daily.   12/16/2023 Evening   carvedilol  (COREG ) 6.25 MG tablet Take 6.25 mg by mouth 2 (two) times daily with a meal.   12/16/2023 Evening   cetirizine (ZYRTEC) 10 MG tablet Take 10 mg by mouth daily.   12/16/2023 Evening   furosemide  (LASIX ) 40 MG tablet Take 40 mg by mouth daily.   12/17/2023 Morning   Lactobacillus-Inulin (CULTURELLE DIGESTIVE HEALTH) CAPS Take by mouth.   Unknown   aspirin EC 81 MG tablet Take 81 mg by mouth daily. Swallow whole. (Patient not taking: Reported on 12/04/2023)      Social History   Socioeconomic History   Marital status: Widowed    Spouse name: Not on file   Number of children: Not on file   Years of education: Not on file   Highest education level: Not on file  Occupational History   Occupation: retired  Tobacco Use   Smoking status: Former   Smokeless tobacco: Never  Substance and Sexual Activity   Alcohol use: No    Comment: wine   Drug use: No   Sexual activity: Never    Birth control/protection: None  Other Topics Concern   Not on file  Social History Narrative   Not on file   Social Drivers of Health   Financial Resource Strain: Patient Declined (12/20/2022)   [facility]   Overall Financial Resource Strain (CARDIA)    Difficulty of Paying Living Expenses: Patient  declined  Food Insecurity: No Food Insecurity (12/18/2023)   Hunger Vital Sign    Worried About Running Out of Food in the Last Year: Never true    Ran Out of Food in the Last Year: Never true  Transportation Needs: No Transportation Needs (12/18/2023)   PRAPARE - Administrator, Civil Service (Medical): No    Lack of Transportation (Non-Medical): No  Physical Activity: Not on file  Stress: Not on file  Social Connections: Moderately Isolated (12/18/2023)   Social  Connection and Isolation Panel    Frequency of Communication with Friends and Family: More than three times a week    Frequency of Social Gatherings with Friends and Family: Three times a week    Attends Religious Services: 1 to 4 times per year    Active Member of Clubs or Organizations: No    Attends Banker Meetings: Never    Marital Status: Widowed  Intimate Partner Violence: Not At Risk (12/18/2023)   Humiliation, Afraid, Rape, and Kick questionnaire    Fear of Current or Ex-Partner: No    Emotionally Abused: No    Physically Abused: No    Sexually Abused: No    History reviewed. No pertinent family history.   Vitals:   12/20/23 0240 12/20/23 0245 12/20/23 0402 12/20/23 0459  BP:   (!) 123/46   Pulse:   97   Resp:   17   Temp:   99.3 F (37.4 C)   TempSrc:   Oral   SpO2: (!) 87% 96% 92%   Weight:    73 kg  Height:        PHYSICAL EXAM General: Well-appearing elderly female, well nourished, in no acute distress. HEENT: Normocephalic and atraumatic. Neck: Elevated JVD.   Lungs: Normal respiratory effort on 2L.  Diminished breath sounds bilaterally Heart: HRRR. Normal S1 and S2 without gallops or murmurs.  Abdomen: Non-distended appearing.  Msk: Normal strength and tone for age. Extremities: Warm and well perfused. No clubbing, cyanosis, + pedal edema Neuro: Alert and oriented X 3. Psych: Answers questions appropriately.   Labs: Basic Metabolic Panel: Recent Labs    12/19/23 0433 12/20/23 0312  NA 140 142  K 3.5 4.2  CL 91* 93*  CO2 37* 38*  GLUCOSE 116* 114*  BUN 20 17  CREATININE 0.59 0.53  CALCIUM  8.9 9.4   Liver Function Tests: No results for input(s): AST, ALT, ALKPHOS, BILITOT, PROT, ALBUMIN in the last 72 hours. No results for input(s): LIPASE, AMYLASE in the last 72 hours. CBC: Recent Labs    12/18/23 0931 12/19/23 0433 12/20/23 0312  WBC 8.2 8.2 7.1  NEUTROABS 6.5  --   --   HGB 15.5* 14.8 14.1  HCT 48.7* 48.3*  46.0  MCV 91.4 93.6 93.5  PLT 232 203 202   Cardiac Enzymes: Recent Labs    12/17/23 1315 12/17/23 1648  TROPONINIHS 10 10   BNP: Recent Labs    12/17/23 1315  BNP 453.6*   D-Dimer: No results for input(s): DDIMER in the last 72 hours. Hemoglobin A1C: No results for input(s): HGBA1C in the last 72 hours. Fasting Lipid Panel: No results for input(s): CHOL, HDL, LDLCALC, TRIG, CHOLHDL, LDLDIRECT in the last 72 hours. Thyroid Function Tests: No results for input(s): TSH, T4TOTAL, T3FREE, THYROIDAB in the last 72 hours.  Invalid input(s): FREET3 Anemia Panel: No results for input(s): VITAMINB12, FOLATE, FERRITIN, TIBC, IRON, RETICCTPCT in the last 72 hours.   Radiology: ECHOCARDIOGRAM COMPLETE Result  Date: 12/18/2023    ECHOCARDIOGRAM REPORT   Patient Name:   AKASHA MELENA Date of Exam: 12/18/2023 Medical Rec #:  [id number]      Height:       62.0 in Accession #:    [id number]     Weight:       151.2 lb Date of Birth:  04/06/1931      BSA:          1.698 m Patient Age:    89 years       BP:           138/59 mmHg Patient Gender: F              HR:           93 bpm. Exam Location:  [facility] Procedure: 2D Echo, Cardiac Doppler and Color Doppler (Both Spectral and Color            Flow Doppler were utilized during procedure). Indications:     CHF-acute diastolic I50.31  History:         Patient has no prior history of Echocardiogram examinations.                  CHF. Cancer.  Sonographer:     Christopher Furnace Referring Phys:  [id number] [name] Diagnosing Phys: [name] MD  Sonographer Comments: Suboptimal parasternal window and suboptimal apical window. Best view is subcostal. IMPRESSIONS  1. Left ventricular ejection fraction, by estimation, is 60 to 65%. The left ventricle has normal function. The left ventricle has no regional wall motion abnormalities. Left ventricular diastolic parameters are consistent with Grade I diastolic dysfunction  (impaired relaxation).  2. Right ventricular systolic function is normal. The right ventricular size is normal.  3. The mitral valve is normal in structure. Mild mitral valve regurgitation. No evidence of mitral stenosis.  4. The aortic valve is normal in structure. Aortic valve regurgitation is not visualized. No aortic stenosis is present.  5. The inferior vena cava is normal in size with greater than 50% respiratory variability, suggesting right atrial pressure of 3 mmHg. FINDINGS  Left Ventricle: Left ventricular ejection fraction, by estimation, is 60 to 65%. The left ventricle has normal function. The left ventricle has no regional wall motion abnormalities. Strain was performed and the global longitudinal strain is indeterminate. The left ventricular internal cavity size was normal in size. There is no left ventricular hypertrophy. Left ventricular diastolic parameters are consistent with Grade I diastolic dysfunction (impaired relaxation). Right Ventricle: The right ventricular size is normal. No increase in right ventricular wall thickness. Right ventricular systolic function is normal. Left Atrium: Left atrial size was normal in size. Right Atrium: Right atrial size was normal in size. Pericardium: There is no evidence of pericardial effusion. Mitral Valve: The mitral valve is normal in structure. Mild mitral valve regurgitation. No evidence of mitral valve stenosis. MV peak gradient, 6.5 mmHg. The mean mitral valve gradient is 3.0 mmHg. Tricuspid Valve: The tricuspid valve is normal in structure. Tricuspid valve regurgitation is mild . No evidence of tricuspid stenosis. Aortic Valve: The aortic valve is normal in structure. Aortic valve regurgitation is not visualized. No aortic stenosis is present. Aortic valve mean gradient measures 3.0 mmHg. Aortic valve peak gradient measures 5.4 mmHg. Aortic valve area, by VTI measures 2.36 cm. Pulmonic Valve: The pulmonic valve was normal in structure. Pulmonic  valve regurgitation is not visualized. No evidence of pulmonic stenosis. Aorta: The aortic root is normal  in size and structure. Venous: The inferior vena cava is normal in size with greater than 50% respiratory variability, suggesting right atrial pressure of 3 mmHg. IAS/Shunts: No atrial level shunt detected by color flow Doppler. Additional Comments: 3D was performed not requiring image post processing on an independent workstation and was indeterminate.  LEFT VENTRICLE PLAX 2D LVIDd:         4.30 cm LVIDs:         2.70 cm LV PW:         1.00 cm LV IVS:        1.00 cm LVOT diam:     2.00 cm LV SV:         53 LV SV Index:   31 LVOT Area:     3.14 cm  RIGHT VENTRICLE RV Basal diam:  3.90 cm RV Mid diam:    2.80 cm LEFT ATRIUM             Index        RIGHT ATRIUM           Index LA diam:        3.10 cm 1.83 cm/m   RA Area:     15.90 cm LA Vol (A2C):   32.1 ml 18.91 ml/m  RA Volume:   42.10 ml  24.80 ml/m LA Vol (A4C):   27.1 ml 15.96 ml/m LA Biplane Vol: 31.2 ml 18.38 ml/m  AORTIC VALVE AV Area (Vmax):    2.22 cm AV Area (Vmean):   1.92 cm AV Area (VTI):     2.36 cm AV Vmax:           116.50 cm/s AV Vmean:          85.650 cm/s AV VTI:            0.225 m AV Peak Grad:      5.4 mmHg AV Mean Grad:      3.0 mmHg LVOT Vmax:         82.20 cm/s LVOT Vmean:        52.400 cm/s LVOT VTI:          0.169 m LVOT/AV VTI ratio: 0.75  AORTA Ao Root diam: 2.40 cm MITRAL VALVE                TRICUSPID VALVE MV Area (PHT): 4.36 cm     TR Peak grad:   26.6 mmHg MV Area VTI:   2.34 cm     TR Vmax:        258.00 cm/s MV Peak grad:  6.5 mmHg MV Mean grad:  3.0 mmHg     SHUNTS MV Vmax:       1.27 m/s     Systemic VTI:  0.17 m MV Vmean:      84.8 cm/s    Systemic Diam: 2.00 cm MV Decel Time: 174 msec MV E velocity: 65.00 cm/s MV A velocity: 104.00 cm/s MV E/A ratio:  0.62 [name] MD Electronically signed by [name] MD Signature Date/Time: 12/18/2023/1:26:03 PM    Final    DG Chest 2 View Result Date:  12/17/2023 CLINICAL DATA:  Shortness of breath. EXAM: CHEST - 2 VIEW COMPARISON:  CT chest 03/05/2020 and chest radiograph 11/25/2019. FINDINGS: Trachea is midline. Heart size stable. Thoracic aorta is calcified. Chronic right middle lobe collapse lungs are otherwise clear. Trace bilateral pleural effusions. IMPRESSION: 1. Trace bilateral pleural effusions. 2. Chronic right middle lobe collapse. Electronically Signed   By:  Newell Eke M.D.   On: 12/17/2023 13:25    ECHO as above.  TELEMETRY reviewed by me 12/20/2023: Sinus rhythm rate 90s  EKG reviewed by me: Sinus rhythm, rate 86 bpm without acute ischemic changes.  Data reviewed by me 12/20/2023: last 24h vitals tele labs imaging I/O hospitalist progress notes.  Principal Problem:   CHF (congestive heart failure) (HCC)    ASSESSMENT AND PLAN:   POSEY JASMIN is a 89 y.o. female  with a past medical history of chronic HFpEF, hypertension, PVD, cervical cancer, recent cataracts surgery of right eye 1 week ago who presented to the ED on 12/17/2023 for worsening shortness of breath for the past week, and endorses orthopnea.  Patient denies chest pain.  Cardiology was consulted for further evaluation of acute on chronic HFpEF.  # Acute on chronic HFpEF # Hypertension Patient with worsening SOB. NP elevated at 450.  CXR with trace bilateral pleural effusions, pulmonary vascular congestion.  Echo this admission with preserved EF of 60-65%, no RWMA, grade 1 diastolic dysfunction, mild MR.  Of note patient states she has lot of medication allergies and does not like starting new meds due to this.  Patient has been refusing losartan  and spironolactone , had long discussion regarding these heart failure related medications and patient is now agreeable. - Continue IV Lasix  40 mg twice daily.  Closely monitor UOP and renal function. - Continue losartan   to 12.5 mg daily. Will uptitrate as BP allows - Continue Coreg  6.25 mg twice daily. Will transition  to PO metoprolol  succinate 25 mg tomorrow to allow more room for BP. Patient is agreeable to medication change.  - Continues spironolactone  12.5 mg daily.  Will uptitrate as BP allows - Would like to initiate Farxiga, however co-pay is high at $150.  Patient prefers we do not initiate. - Recommend patient ambulate today. - Recommend wrapping legs to help with LEE.  - Ordered DVT US  study due to worsening LEE and pain on palpation despite diuresis. Patient has also refused DVT prophylaxis.   This patient's plan of care was discussed and created with Dr. Ammon and he is in agreement.  Signed: Dorene Comfort, PA-C  12/20/2023, 10:56 AM [facility] Cardiology

## 2023-12-20 NOTE — TOC Progression Note (Addendum)
 Transition of Care [facility]) - Progression Note    Patient Details  Name: Gloria Grimes MRN: [id number] Date of Birth: Jul 31, 1930  [facility] CM/SW Contact  Tomasa JAYSON Childes, RN Phone Number: 12/20/2023, 1:29 PM  Clinical Narrative:    Spoke with patient's son, [name] regarding discharge plan. Patient son was advised patient will not likely qualify for SNF due to being able to ambulate 250 ft and no therapy recommendation. [name] stated he would prefer she go to skill due to patient new oxygen requirement and needing additional toileting assistance. [name] was educated about SNF loc vs HH  vs home care. He was advised home care is not reimbursed by insurance. [name] stated his frustrations with patient paying for insurance for years but not having a sufficient benefit to pay for care requested. He is requesting patient be reevaluated by therapy. [name] stated he can pay out of pocket for skilled care if needed but feels the insurance should pay. [name] stated planning for discharge at this time is premature. RNCM educated about proactive discharge planning from admission. He was advised TOC will follow up after PT reeval. RNCM contact information provided.   Message left for Luke Oman at [facility] to  inquire about weekend discharges. No answer. Left a message.   3:28pm  Retrieved incoming call from [facility]. Patient can be accepted over the weekend provided discharge summary is received by tomorrow at 1:30pm. She was                       Expected Discharge Plan and Services                                               Social Drivers of Health (SDOH) Interventions SDOH Screenings   Food Insecurity: No Food Insecurity (12/18/2023)  Housing: Low Risk  (12/18/2023)  Transportation Needs: No Transportation Needs (12/18/2023)  Utilities: Not At Risk (12/18/2023)  Financial Resource Strain: Patient Declined (12/20/2022)   Received from [facility]   Social Connections: Moderately Isolated (12/18/2023)  Tobacco Use: Medium Risk (12/17/2023)    Readmission Risk Interventions     No data to display

## 2023-12-20 NOTE — Plan of Care (Signed)

## 2023-12-21 ENCOUNTER — Inpatient Hospital Stay

## 2023-12-21 DIAGNOSIS — I5033 Acute on chronic diastolic (congestive) heart failure: Secondary | ICD-10-CM | POA: Diagnosis not present

## 2023-12-21 LAB — BASIC METABOLIC PANEL WITH GFR
Anion gap: 8 (ref 5–15)
BUN: 17 mg/dL (ref 8–23)
CO2: 44 mmol/L — ABNORMAL HIGH (ref 22–32)
Calcium: 9.4 mg/dL (ref 8.9–10.3)
Chloride: 91 mmol/L — ABNORMAL LOW (ref 98–111)
Creatinine, Ser: 0.5 mg/dL (ref 0.44–1.00)
GFR, Estimated: >60 mL/min (ref >60)
Glucose, Bld: 90 mg/dL (ref 70–99)
Potassium: 3.7 mmol/L (ref 3.5–5.1)
Sodium: 143 mmol/L (ref 135–145)

## 2023-12-21 LAB — BLOOD GAS, VENOUS
Acid-Base Excess: 18.7 mmol/L — ABNORMAL HIGH (ref 0.0–2.0)
Bicarbonate: 48.5 mmol/L — ABNORMAL HIGH (ref 20.0–28.0)
O2 Saturation: 89.2 %
Patient temperature: 37
pCO2, Ven: 82 mmHg (ref 44–60)
pH, Ven: 7.38 (ref 7.25–7.43)
pO2, Ven: 55 mmHg — ABNORMAL HIGH (ref 32–45)

## 2023-12-21 LAB — TSH: TSH: 1.262 u[IU]/mL (ref 0.350–4.500)

## 2023-12-21 MED ORDER — PREDNISOLON-MOXIFLOX-BROMFENAC 1-0.5-0.075 % OP SOLN
1.0000 [drp] | Freq: Two times a day (BID) | OPHTHALMIC | Status: DC
  Administered 2023-12-21 – 2023-12-25 (×9): 1 [drp] via OPHTHALMIC

## 2023-12-21 MED ORDER — ACETAMINOPHEN 10 MG/ML IV SOLN
1000.0000 mg | Freq: Once | INTRAVENOUS | Status: AC
  Filled 2023-12-21: qty 100

## 2023-12-21 MED ORDER — METHYLPREDNISOLONE SODIUM SUCC 40 MG IJ SOLR
40.0000 mg | Freq: Every day | INTRAMUSCULAR | Status: DC
  Administered 2023-12-21 – 2023-12-22 (×2): 40 mg via INTRAVENOUS
  Filled 2023-12-21 (×2): qty 1

## 2023-12-21 MED ORDER — LEVOFLOXACIN IN D5W 500 MG/100ML IV SOLN
500.0000 mg | Freq: Once | INTRAVENOUS | Status: AC
  Administered 2023-12-21: 500 mg via INTRAVENOUS
  Filled 2023-12-21: qty 100

## 2023-12-21 MED ORDER — POTASSIUM CHLORIDE CRYS ER 20 MEQ PO TBCR
30.0000 meq | EXTENDED_RELEASE_TABLET | Freq: Once | ORAL | Status: AC
  Administered 2023-12-21: 30 meq via ORAL
  Filled 2023-12-21: qty 1

## 2023-12-21 MED ORDER — LEVOFLOXACIN 250 MG PO TABS
250.0000 mg | ORAL_TABLET | Freq: Every day | ORAL | Status: AC
  Administered 2023-12-22 – 2023-12-25 (×4): 250 mg via ORAL
  Filled 2023-12-21 (×4): qty 1

## 2023-12-21 MED ORDER — ALBUTEROL SULFATE (2.5 MG/3ML) 0.083% IN NEBU
2.5000 mg | INHALATION_SOLUTION | Freq: Three times a day (TID) | RESPIRATORY_TRACT | Status: DC
  Filled 2023-12-21: qty 3

## 2023-12-21 MED ORDER — ACETAZOLAMIDE SODIUM 500 MG IJ SOLR
500.0000 mg | Freq: Once | INTRAMUSCULAR | Status: AC
  Administered 2023-12-21: 500 mg via INTRAVENOUS
  Filled 2023-12-21: qty 500

## 2023-12-21 MED ORDER — SPIRONOLACTONE 25 MG PO TABS
25.0000 mg | ORAL_TABLET | Freq: Every day | ORAL | Status: DC
  Administered 2023-12-21 – 2023-12-25 (×5): 25 mg via ORAL
  Filled 2023-12-21 (×6): qty 1

## 2023-12-21 MED ORDER — LEVOFLOXACIN IN D5W 750 MG/150ML IV SOLN: 750.0000 mg | INTRAVENOUS | Status: DC

## 2023-12-21 MED ORDER — IPRATROPIUM-ALBUTEROL 0.5-2.5 (3) MG/3ML IN SOLN
3.0000 mL | Freq: Three times a day (TID) | RESPIRATORY_TRACT | Status: DC
  Administered 2023-12-21 – 2023-12-25 (×12): 3 mL via RESPIRATORY_TRACT
  Filled 2023-12-21 (×11): qty 3

## 2023-12-21 NOTE — Progress Notes (Signed)
 PROGRESS NOTE    Gloria Grimes  FMW:[id number] DOB: 01/30/1931 DOA: 12/17/2023 PCP: Valora Agent, MD  Chief Complaint  Patient presents with   Shortness of Breath    Hospital Course:  Gloria Grimes is a 89 year old female with history of cervical cancer, CHF with preserved EF, recent cataract surgery involving the right eye, who presented with progressive shortness of breath and PND.  On arrival patient was found to be hypoxic to 80%, BNP 453, CXR with vascular congestion and chronic right middle lung collapse.  Patient was admitted for acute on chronic diastolic heart failure exacerbation  Subjective: Overnight patient became acutely confused and was found to be hypercapnic.  She was started on CPAP.  Chest CT performed which was mostly unremarkable.  Head CT without acute changes.    This morning patient is still on BiPAP but VBG is much improved.  She is requesting to discontinue BiPAP therapy.  Objective: Vitals:   12/21/23 0519 12/21/23 0726 12/21/23 0852 12/21/23 1117  BP:  (!) 130/53 (!) 130/53 (!) 106/47  Pulse:  91 91 79  Resp:  (!) 23  (!) 21  Temp:  98.3 F (36.8 C)  98.2 F (36.8 C)  TempSrc:      SpO2:  97%  97%  Weight: 65.8 kg     Height:        Intake/Output Summary (Last 24 hours) at 12/21/2023 1452 Last data filed at 12/21/2023 1300 Gross per 24 hour  Intake 430 ml  Output 200 ml  Net 230 ml   Filed Weights   12/19/23 0500 12/20/23 0459 12/21/23 0519  Weight: 68.3 kg 73 kg 65.8 kg    Examination: General exam: Appears calm and comfortable, NAD  Respiratory system: No work of breathing, symmetric chest wall expansion, crackles left lung base Cardiovascular system: S1 & S2 heard, RRR.  Gastrointestinal system: Abdomen is nondistended, soft and nontender.  Neuro: Alert and oriented. No focal neurological deficits. Extremities: 1+ edema bilaterally, no significant erythema or tenderness to palpation in either calf Skin: No rashes,  lesions Psychiatry: Demonstrates appropriate judgement and insight. Mood & affect appropriate for situation.   Assessment & Plan:  Principal Problem:   CHF (congestive heart failure) (HCC)    Hypercapnic and hypoxic respiratory failure Chronic right middle lung collapse - Hypercapnia likely multifactorial, perhaps some component of contraction alkalosis, complicated by chronic CO2 retention, suspected underlying OSA, chronic lung disease, +/- COPD flare - Wean off BiPAP this a.m.  Now stable on nasal cannula again - Lasix  discontinued, clinically euvolemic - Received Diamox x 1. - Patient follows outpatient with pulmonology Dr. Aleskerov, he was consulted today - Patient has been initiated on steroids, MetaNebs, and Levaquin  given concern for overlying COPD exacerbation. - Has required home O2 in the past though she was stable on room air as recently as 2 weeks ago - Encourage incentive spirometry and flutter valve - If no improvement with new therapies we will set up for home O2 vs DC to SNF  Acute on chronic diastolic CHF - BNP elevated to 453 on arrival - Echo this admission: LVEF 60 to 65%, no RWMA, grade 1 diastolic dysfunction.  No significant valvular disease noted. - Have discontinued IV Lasix .  Patient is clinically euvolemic. - Continue with losartan , though in setting of preserved EF can consider discontinuation to allow for greater diuresis as needed -Cardiology following  Lower extremity swelling - This is chronic.  In some part exacerbated by acute CHF exacerbation.  Patient  does have stents in the right leg and is followed by [facility]. - Dopplers negative for DVT - Per family patient report this is the best the patient's legs have looked in years  Tobacco abuse - Quit smoking 40 years prior. - Emphysematous changes on CT though patient does not carry a diagnosis of COPD  Hypertension - Resume meds and titrate as needed  Generalized weakness - Physical therapy  currently recommending SNF.  TOC has been consulted to assist in these arrangements  DVT prophylaxis: Lovenox    Code Status: Limited: Do not attempt resuscitation (DNR) -DNR-LIMITED -Do Not Intubate/DNI  Disposition: Physical therapy currently recommending SNF, pending placement  Consultants:  Treatment Team:  Consulting Physician: Ammon Blunt, MD Consulting Physician: Parris Manna, MD  Procedures:    Antimicrobials:  Anti-infectives (From admission, onward)    Start     Dose/Rate Route Frequency Ordered Stop   12/22/23 1000  levofloxacin  (LEVAQUIN ) tablet 250 mg       Placed in Followed by Linked Group   250 mg Oral Daily 12/21/23 1417 12/26/23 0959   12/21/23 1600  levofloxacin  (LEVAQUIN ) IVPB 500 mg       Placed in Followed by Linked Group   500 mg 100 mL/hr over 60 Minutes Intravenous  Once 12/21/23 1417     12/21/23 1445  levofloxacin  (LEVAQUIN ) IVPB 750 mg  Status:  Discontinued        750 mg 100 mL/hr over 90 Minutes Intravenous Every 24 hours 12/21/23 1346 12/21/23 1417       Data Reviewed: I have personally reviewed following labs and imaging studies CBC: Recent Labs  Lab 12/17/23 1315 12/18/23 0931 12/19/23 0433 12/20/23 0312  WBC 8.8 8.2 8.2 7.1  NEUTROABS  --  6.5  --   --   HGB 15.7* 15.5* 14.8 14.1  HCT 48.9* 48.7* 48.3* 46.0  MCV 88.7 91.4 93.6 93.5  PLT 234 232 203 202   Basic Metabolic Panel: Recent Labs  Lab 12/18/23 0931 12/19/23 0433 12/20/23 0312 12/20/23 2217 12/21/23 0419  NA 140 140 142 142 143  K 3.8 3.5 4.2 3.9 3.7  CL 92* 91* 93* 89* 91*  CO2 34* 37* 38* 44* 44*  GLUCOSE 114* 116* 114* 149* 90  BUN 16 20 17 18 17   CREATININE 0.62 0.59 0.53 0.53 0.50  CALCIUM  9.4 8.9 9.4 9.5 9.4   GFR: Estimated Creatinine Clearance: 40.8 mL/min (by C-G formula based on SCr of 0.5 mg/dL). Liver Function Tests: Recent Labs  Lab 12/20/23 2217  AST 14*  ALT 15  ALKPHOS 42  BILITOT 0.9  PROT 6.0*  ALBUMIN 3.2*    CBG: Recent Labs  Lab 12/20/23 2158  GLUCAP 147*    No results found for this or any previous visit (from the past 240 hours).   Radiology Studies: CT HEAD WO CONTRAST ( ) Result Date: 12/21/2023 CLINICAL DATA:  Mental status change, unknown cause EXAM: CT HEAD WITHOUT CONTRAST TECHNIQUE: Contiguous axial images were obtained from the base of the skull through the vertex without intravenous contrast. RADIATION DOSE REDUCTION: This exam was performed according to the departmental dose-optimization program which includes automated exposure control, adjustment of the mA and/or kV according to patient size and/or use of iterative reconstruction technique. COMPARISON:  CT head November 24, 2019. FINDINGS: Brain: No evidence of acute infarction, hemorrhage, hydrocephalus, extra-axial collection or mass lesion/mass effect. Vascular: No hyperdense vessel or unexpected calcification. Skull: Normal. Negative for fracture or focal lesion. Sinuses/Orbits: No acute finding. IMPRESSION: No acute  abnormality. Electronically Signed   By: Gilmore GORMAN Molt M.D.   On: 12/21/2023 02:23   CT CHEST WO CONTRAST Result Date: 12/21/2023 EXAM: CT CHEST WITHOUT CONTRAST 12/21/2023 01:48:42 AM TECHNIQUE: CT of the chest was performed without the administration of intravenous contrast. Multiplanar reformatted images are provided for review. Automated exposure control, iterative reconstruction, and/or weight based adjustment of the mA/kV was utilized to reduce the radiation dose to as low as reasonably achievable. COMPARISON: 03/05/2020 CLINICAL HISTORY: Respiratory illness, nondiagnostic xray; acute resp failure with hypercapnea. FINDINGS: MEDIASTINUM: Heart and pericardium are unremarkable. The central airways are clear. Mild 3-vessel coronary atherosclerosis. Thoracic aortic atherosclerosis. LYMPH NODES: No mediastinal, hilar or axillary lymphadenopathy. LUNGS AND PLEURA: Moderate centrilobular and paraseptal emphysematous  changes, upper lung predominant. Trace left pleural effusion with left basilar scarring/atelectasis. Complete right middle lobe collapse, chronic. No focal consolidation or pulmonary edema. No pneumothorax. SOFT TISSUES/BONES: Mild degenerative changes of the visualized thoracolumbar spine. No acute abnormality of the bones or soft tissues. UPPER ABDOMEN: Limited images of the upper abdomen demonstrates no acute abnormality. IMPRESSION: 1. No acute findings. 2. Trace left pleural effusion with left basilar scarring/atelectasis. 3. Complete right middle lobe collapse, chronic. Electronically signed by: Pinkie Pebbles MD 12/21/2023 01:54 AM EDT RP Workstation: [id number]   US  Venous Img Lower Bilateral (DVT) Result Date: 12/20/2023 CLINICAL DATA:  Lower extremity edema. EXAM: BILATERAL LOWER EXTREMITY VENOUS DOPPLER ULTRASOUND TECHNIQUE: Gray-scale sonography with graded compression, as well as color Doppler and duplex ultrasound were performed to evaluate the lower extremity deep venous systems from the level of the common femoral vein and including the common femoral, femoral, profunda femoral, popliteal and calf veins including the posterior tibial, peroneal and gastrocnemius veins when visible. The superficial great saphenous vein was also interrogated. Spectral Doppler was utilized to evaluate flow at rest and with distal augmentation maneuvers in the common femoral, femoral and popliteal veins. COMPARISON:  None Available. FINDINGS: RIGHT LOWER EXTREMITY Common Femoral Vein: No evidence of thrombus. Normal compressibility, respiratory phasicity and response to augmentation. Saphenofemoral Junction: No evidence of thrombus. Normal compressibility and flow on color Doppler imaging. Profunda Femoral Vein: No evidence of thrombus. Normal compressibility and flow on color Doppler imaging. Femoral Vein: No evidence of thrombus. Normal compressibility, respiratory phasicity and response to augmentation. Popliteal  Vein: No evidence of thrombus. Normal compressibility, respiratory phasicity and response to augmentation. Calf Veins: No evidence of thrombus. Normal compressibility and flow on color Doppler imaging. Superficial Great Saphenous Vein: No evidence of thrombus. Normal compressibility. Venous Reflux:  None. Other Findings: No evidence of superficial thrombophlebitis or abnormal fluid collection. LEFT LOWER EXTREMITY Common Femoral Vein: No evidence of thrombus. Normal compressibility, respiratory phasicity and response to augmentation. Saphenofemoral Junction: No evidence of thrombus. Normal compressibility and flow on color Doppler imaging. Profunda Femoral Vein: No evidence of thrombus. Normal compressibility and flow on color Doppler imaging. Femoral Vein: No evidence of thrombus. Normal compressibility, respiratory phasicity and response to augmentation. Popliteal Vein: No evidence of thrombus. Normal compressibility, respiratory phasicity and response to augmentation. Calf Veins: No evidence of thrombus. Normal compressibility and flow on color Doppler imaging. Superficial Great Saphenous Vein: No evidence of thrombus. Normal compressibility. Venous Reflux:  None. Other Findings: No evidence of superficial thrombophlebitis or abnormal fluid collection. IMPRESSION: No evidence of deep venous thrombosis in either lower extremity. Electronically Signed   By: Marcey Moan M.D.   On: 12/20/2023 16:25   DG Chest Port 1 View Result Date: 12/20/2023 CLINICAL DATA:  Hypoxia.  Congestive  heart failure. EXAM: PORTABLE CHEST 1 VIEW COMPARISON:  12/17/2023 FINDINGS: Stable heart size and mediastinal contours. Increase in peribronchial thickening. There may be small pleural effusions. Atelectasis at the lung bases, more so on the left. No pneumothorax IMPRESSION: 1. Increase in peribronchial thickening, may represent bronchitis or pulmonary edema. 2. Possible small pleural effusions. Bibasilar atelectasis. Electronically  Signed   By: Andrea Gasman M.D.   On: 12/20/2023 14:21    Scheduled Meds:  enoxaparin  (LOVENOX ) injection  40 mg Subcutaneous Q24H   ipratropium-albuterol   3 mL Nebulization TID   [START ON 12/22/2023] levofloxacin   250 mg Oral Daily   loratadine   10 mg Oral Daily   losartan   12.5 mg Oral Daily   methylPREDNISolone  (SOLU-MEDROL ) injection  40 mg Intravenous Daily   metoprolol  succinate  25 mg Oral Daily   Prednisolon-Moxiflox-Bromfenac   1 drop Right Eye BID   spironolactone   25 mg Oral Daily   Continuous Infusions:  acetaminophen      levofloxacin  (LEVAQUIN ) IV       LOS: 4 days  MDM: Patient is high risk for one or more organ failure.  They necessitate ongoing hospitalization for continued IV therapies and subsequent lab monitoring. Total time spent interpreting labs and vitals, reviewing the medical record, coordinating care amongst consultants and care team members, directly assessing and discussing care with the patient and/or family: 55 min  Morocco Gipe, DO [facility]  To contact the attending physician between 7A-7P please use Epic Chat. To contact the covering physician during after hours 7P-7A, please review Amion.  12/21/2023, 2:52 PM   *This document has been created with the assistance of dictation software. Please excuse typographical errors. *

## 2023-12-21 NOTE — Progress Notes (Signed)
 This RN found pt to be extremely lethargic during nightly med pass at 2145. Pt responds to pain/shaking, but only keeps her eyes open for a second, incomprehensible speech, not following commands, and poor attention/concentration. VSS and BG 147. [name], NP notified. [name], charge RN notified and after assessing the pt, requested to call a rapid. 2200 meds held.  Rapid [id number]: Cone, NP, and rapid team at bedside. Pt responds to voice, speech is garbled, disoriented and responding to questions with letters or counting, following commands, and states I feel funny. Pt's strength, face, and pupils are symmetrical. Labs, head CT, chest CT, and EKG ordered. BiPAP placed based on VBG. Pt made NPO.

## 2023-12-21 NOTE — H&P (Signed)
 PHARMACY NOTE:  ANTIMICROBIAL RENAL DOSAGE ADJUSTMENT  Current antimicrobial regimen includes a mismatch between antimicrobial dosage and estimated renal function.  As per policy approved by the Pharmacy & Therapeutics and Medical Executive Committees, the antimicrobial dosage will be adjusted accordingly.  Current antimicrobial dosage:  750mg  IV q24h  Indication: Acute exacerbation of COPD  Renal Function:  Estimated Creatinine Clearance: 40.8 mL/min (by C-G formula based on SCr of 0.5 mg/dL).     Antimicrobial dosage has been changed to:  250mg  IV q24h  Additional comments:   Thank you for allowing pharmacy to be a part of this patient's care.  Leonor JAYSON Argyle, PharmD Pharmacy Resident  12/21/2023 2:06 PM

## 2023-12-21 NOTE — Consult Note (Signed)
 PULMONOLOGY         Date: 12/21/2023,   MRN# [id number] Gloria Grimes 09/26/1930     AdmissionWeight: 70.3 kg                 CurrentWeight: 65.8 kg  Referring provider: Dr Lillette   CHIEF COMPLAINT:   Complete right lung collapse    HISTORY OF PRESENT ILLNESS   Gloria Grimes is a 89 y.o. female with medical history significant of cervical cancer,diastolic CHF, cataracts came in for worsening SOB.  Denies cough, abdominal pain, nausea vomiting, chest pain, or urinary complaints Upon arrival to the emergency room patient was found to have saturation of 80%, this improved with 2 L of intranasal oxygen to the low 90s.  BNP elevated to 453.  Chest x-ray showing findings of vascular congestion.  As well as chronic right middle lung lobe collapse.  pCCM consultation for further evaluation and management.    PAST MEDICAL HISTORY   Past Medical History:  Diagnosis Date   Cancer (HCC)    ovarian   CHF (congestive heart failure) (HCC)      SURGICAL HISTORY   Past Surgical History:  Procedure Laterality Date   ABDOMINAL HYSTERECTOMY     CATARACT EXTRACTION W/PHACO Right 12/12/2023   Procedure: PHACOEMULSIFICATION, CATARACT, WITH IOL INSERTION 12.79 01:03.7;  Surgeon: Mittie Gaskin, MD;  Location: [facility] OR[facility];  Service: Ophthalmology;  Laterality: Right;   LOWER EXTREMITY ANGIOGRAPHY Right 12/16/2020   Procedure: LOWER EXTREMITY ANGIOGRAPHY;  Surgeon: Marea Selinda RAMAN, MD;  Location: [facility];  Service: Cardiovascular;  Laterality: Right;     FAMILY HISTORY   History reviewed. No pertinent family history.   SOCIAL HISTORY   Social History   Tobacco Use   Smoking status: Former   Smokeless tobacco: Never  Substance Use Topics   Alcohol use: No    Comment: wine   Drug use: No     MEDICATIONS    Home Medication:    Current Medication:  Current Facility-Administered Medications:    acetaminophen  (OFIRMEV ) IV 1,000 mg, 1,000 mg,  Intravenous, Once, Jesus America, NP   dextromethorphan -guaiFENesin  (MUCINEX  DM) 30-600 MG per 12 hr tablet 1 tablet, 1 tablet, Oral, BID PRN, Dezii, Alexandra, DO   enoxaparin  (LOVENOX ) injection 40 mg, 40 mg, Subcutaneous, Q24H, Djan, Prince T, MD, 40 mg at 12/19/23 2114   ipratropium-albuterol  (DUONEB) 0.5-2.5 (3) MG/3ML nebulizer solution 3 mL, 3 mL, Nebulization, Q4H PRN, Djan, Prince T, MD   loratadine  (CLARITIN ) tablet 10 mg, 10 mg, Oral, Daily, Mansy, Jan A, MD, 10 mg at 12/19/23 2113   losartan  (COZAAR ) tablet 12.5 mg, 12.5 mg, Oral, Daily, Decoste, Gabriella, PA-C, 12.5 mg at 12/21/23 9147   metoprolol  succinate (TOPROL -XL) 24 hr tablet 25 mg, 25 mg, Oral, Daily, Decoste, Gabriella, PA-C, 25 mg at 12/21/23 9147   Prednisolon-Moxiflox-Bromfenac  1-0.5-0.075 % SOLN 1 drop, 1 drop, Right Eye, BID, Merrill, Kristin A, RPH   spironolactone  (ALDACTONE ) tablet 25 mg, 25 mg, Oral, Daily, Decoste, Gabriella, PA-C, 25 mg at 12/21/23 9146    ALLERGIES   Statins, Azithromycin , Clindamycin hcl, Demeclocycline, Erythromycin ethylsuccinate, Penicillins, Shellfish allergy, and Triamcinolone acetonide     REVIEW OF SYSTEMS    Review of Systems:  Gen:  Denies  fever, sweats, chills weigh loss  HEENT: Denies blurred vision, double vision, ear pain, eye pain, hearing loss, nose bleeds, sore throat Cardiac:  No dizziness, chest pain or heaviness, chest tightness,edema Resp:   reports dyspnea chronically  Gi: Denies swallowing difficulty, stomach pain, nausea or vomiting, diarrhea, constipation, bowel incontinence Gu:  Denies bladder incontinence, burning urine Ext:   Denies Joint pain, stiffness or swelling Skin: Denies  skin rash, easy bruising or bleeding or hives Endoc:  Denies polyuria, polydipsia , polyphagia or weight change Psych:   Denies depression, insomnia or hallucinations   Other:  All other systems negative   VS: BP (!) 106/47 (BP Location: Right Arm)   Pulse 79   Temp  98.2 F (36.8 C)   Resp (!) 21   Ht 5' 2 (1.575 m)   Wt 65.8 kg   SpO2 97%   BMI 26.53 kg/m      PHYSICAL EXAM    GENERAL:NAD, no fevers, chills, no weakness no fatigue HEAD: Normocephalic, atraumatic.  EYES: Pupils equal, round, reactive to light. Extraocular muscles intact. No scleral icterus.  MOUTH: Moist mucosal membrane. Dentition intact. No abscess noted.  EAR, NOSE, THROAT: Clear without exudates. No external lesions.  NECK: Supple. No thyromegaly. No nodules. No JVD.  PULMONARY: decreased breath sounds with mild rhonchi worse at bases bilaterally.  CARDIOVASCULAR: S1 and S2. Regular rate and rhythm. No murmurs, rubs, or gallops. No edema. Pedal pulses 2+ bilaterally.  GASTROINTESTINAL: Soft, nontender, nondistended. No masses. Positive bowel sounds. No hepatosplenomegaly.  MUSCULOSKELETAL: No swelling, clubbing, or edema. Range of motion full in all extremities.  NEUROLOGIC: Cranial nerves II through XII are intact. No gross focal neurological deficits. Sensation intact. Reflexes intact.  SKIN: No ulceration, lesions, rashes, or cyanosis. Skin warm and dry. Turgor intact.  PSYCHIATRIC: Mood, affect within normal limits. The patient is awake, alert and oriented x 3. Insight, judgment intact.       IMAGING   Narrative & Impression  EXAM: CT CHEST WITHOUT CONTRAST 12/21/2023 01:48:42 AM   TECHNIQUE: CT of the chest was performed without the administration of intravenous contrast. Multiplanar reformatted images are provided for review. Automated exposure control, iterative reconstruction, and/or weight based adjustment of the mA/kV was utilized to reduce the radiation dose to as low as reasonably achievable.   COMPARISON: 03/05/2020   CLINICAL HISTORY: Respiratory illness, nondiagnostic xray; acute resp failure with hypercapnea.   FINDINGS:   MEDIASTINUM: Heart and pericardium are unremarkable. The central airways are clear. Mild 3-vessel coronary  atherosclerosis. Thoracic aortic atherosclerosis.   LYMPH NODES: No mediastinal, hilar or axillary lymphadenopathy.   LUNGS AND PLEURA: Moderate centrilobular and paraseptal emphysematous changes, upper lung predominant. Trace left pleural effusion with left basilar scarring/atelectasis. Complete right middle lobe collapse, chronic. No focal consolidation or pulmonary edema. No pneumothorax.   SOFT TISSUES/BONES: Mild degenerative changes of the visualized thoracolumbar spine. No acute abnormality of the bones or soft tissues.   UPPER ABDOMEN: Limited images of the upper abdomen demonstrates no acute abnormality.   IMPRESSION: 1. No acute findings. 2. Trace left pleural effusion with left basilar scarring/atelectasis. 3. Complete right middle lobe collapse, chronic.   Electronically signed by: Pinkie Pebbles MD 12/21/2023 01:54 AM EDT RP Workstation: [id number]    ASSESSMENT/PLAN   Right middle lobe syndrome   - likely related to chronic bronchiectatic changes and mucoid impaction    - query regarding atypical mycobacterial infection   - will treat with steroids solumedrol 40mg  daily IV and duoneb TID as well as levolfloxacin given overlying COPD exacerbation  - her EKG does not show QTC prolongation   - continue PT/OT    - VEST therapy is generally helpful but in her case I think metaneb would be  more beneficial and I have ordered this with nebs TID   Complete atelectasis of RML   Flutter valve   - nebulizer therapy    - PT/OT   - metaneb recruitment maneuvers with RT TID   Centrilobular emphysema with COPD    - continue current COPD care path    - levofloxacin      Duoneb q6h    - solumedrol 40 iv daily             Thank you for allowing me to participate in the care of this patient.   Patient/Family are satisfied with care plan and all questions have been answered.    Provider disclosure: Patient with at least one acute or chronic illness or injury  that poses a threat to life or bodily function and is being managed actively during this encounter.  All of the below services have been performed independently by signing provider:  review of prior documentation from internal and or external health records.  Review of previous and current lab results.  Interview and comprehensive assessment during patient visit today. Review of current and previous chest radiographs/CT scans. Discussion of management and test interpretation with health care team and patient/family.   This document was prepared using Dragon voice recognition software and may include unintentional dictation errors.     Yuritza Paulhus, M.D.  Division of Pulmonary & Critical Care Medicine

## 2023-12-21 NOTE — Plan of Care (Signed)

## 2023-12-21 NOTE — Progress Notes (Signed)
 [facility] CLINIC CARDIOLOGY PROGRESS NOTE       Patient ID: Gloria Grimes MRN: [id number] DOB/AGE: [birth year] 89 y.o.  Admit date: 12/17/2023 Referring Physician dr. Drue Potter Primary Physician Valora Agent, MD Primary Cardiologist Dr. Ammon Reason for Consultation CHF exacerbation   HPI: Gloria Grimes is a 89 y.o. female  with a past medical history of chronic HFpEF, hypertension, PVD, cervical cancer, recent cataracts surgery of right eye 1 week ago who presented to the ED on 12/17/2023 for worsening shortness of breath for the past week, and endorses orthopnea.  Patient denies chest pain.  Cardiology was consulted for further evaluation of acute on chronic HFpEF.  Interval History: -Patient seen and examined this AM and laying comfortably with BiPAP in place, daughter at bedside. Reports she felt SOB last night, this AM is much improved. Continues to deny chest pain, palpitations or lightheadedness. Saw pt again this afternoon and patient was on 2L [place] and states she feels a lot better and denies SOB.  -Trace LEE today, around baseline, patient states feel less tight today -Patients BP and HR stable this AM. Overnight Tele showed no significant events.  -Patient weaned off BiPAP around 11 AM today to 2L [place] with stable SpO2.     Review of systems complete and found to be negative unless listed above    Past Medical History:  Diagnosis Date   Cancer (HCC)    ovarian   CHF (congestive heart failure) (HCC)     Past Surgical History:  Procedure Laterality Date   ABDOMINAL HYSTERECTOMY     CATARACT EXTRACTION W/PHACO Right 12/12/2023   Procedure: PHACOEMULSIFICATION, CATARACT, WITH IOL INSERTION 12.79 01:03.7;  Surgeon: Mittie Gaskin, MD;  Location: [facility] ORS;  Service: Ophthalmology;  Laterality: Right;   LOWER EXTREMITY ANGIOGRAPHY Right 12/16/2020   Procedure: LOWER EXTREMITY ANGIOGRAPHY;  Surgeon: Marea Selinda RAMAN, MD;  Location: [facility] INVASIVE CV LAB;  Service:  Cardiovascular;  Laterality: Right;    Medications Prior to Admission  Medication Sig Dispense Refill Last Dose/Taking   ascorbic acid  (VITAMIN C ) 500 MG tablet Take 500 mg by mouth daily.   12/16/2023 Evening   carvedilol  (COREG ) 6.25 MG tablet Take 6.25 mg by mouth 2 (two) times daily with a meal.   12/16/2023 Evening   cetirizine (ZYRTEC) 10 MG tablet Take 10 mg by mouth daily.   12/16/2023 Evening   furosemide  (LASIX ) 40 MG tablet Take 40 mg by mouth daily.   12/17/2023 Morning   Lactobacillus-Inulin (CULTURELLE DIGESTIVE HEALTH) CAPS Take by mouth.   Unknown   aspirin EC 81 MG tablet Take 81 mg by mouth daily. Swallow whole. (Patient not taking: Reported on 12/04/2023)      Social History   Socioeconomic History   Marital status: Widowed    Spouse name: Not on file   Number of children: Not on file   Years of education: Not on file   Highest education level: Not on file  Occupational History   Occupation: retired  Tobacco Use   Smoking status: Former   Smokeless tobacco: Never  Substance and Sexual Activity   Alcohol use: No    Comment: wine   Drug use: No   Sexual activity: Never    Birth control/protection: None  Other Topics Concern   Not on file  Social History Narrative   Not on file   Social Drivers of Health   Financial Resource Strain: Patient Declined (12/20/2022)   Received from [facility]   Overall  Financial Resource Strain (CARDIA)    Difficulty of Paying Living Expenses: Patient declined  Food Insecurity: No Food Insecurity (12/18/2023)   Hunger Vital Sign    Worried About Running Out of Food in the Last Year: Never true    Ran Out of Food in the Last Year: Never true  Transportation Needs: No Transportation Needs (12/18/2023)   PRAPARE - Administrator, Civil Service (Medical): No    Lack of Transportation (Non-Medical): No  Physical Activity: Not on file  Stress: Not on file  Social Connections: Moderately Isolated  (12/18/2023)   Social Connection and Isolation Panel    Frequency of Communication with Friends and Family: More than three times a week    Frequency of Social Gatherings with Friends and Family: Three times a week    Attends Religious Services: 1 to 4 times per year    Active Member of Clubs or Organizations: No    Attends Banker Meetings: Never    Marital Status: Widowed  Intimate Partner Violence: Not At Risk (12/18/2023)   Humiliation, Afraid, Rape, and Kick questionnaire    Fear of Current or Ex-Partner: No    Emotionally Abused: No    Physically Abused: No    Sexually Abused: No    History reviewed. No pertinent family history.   Vitals:   12/21/23 0519 12/21/23 0726 12/21/23 0852 12/21/23 1117  BP:  (!) 130/53 (!) 130/53 (!) 106/47  Pulse:  91 91 79  Resp:  (!) 23  (!) 21  Temp:  98.3 F (36.8 C)  98.2 F (36.8 C)  TempSrc:      SpO2:  97%  97%  Weight: 65.8 kg     Height:        PHYSICAL EXAM General: Well-appearing elderly female, well nourished, in no acute distress. HEENT: Normocephalic and atraumatic. Neck: Elevated JVD.   Lungs: Normal respiratory effort on 2L.  Diminished breath sounds bilaterally Heart: HRRR. Normal S1 and S2 without gallops or murmurs.  Abdomen: Non-distended appearing.  Msk: Normal strength and tone for age. Extremities: Warm and well perfused. No clubbing, cyanosis,  pedal edema around baseline with TED hose in place. Neuro: Alert and oriented X 3. Psych: Answers questions appropriately.   Labs: Basic Metabolic Panel: Recent Labs    12/20/23 2217 12/21/23 0419  NA 142 143  K 3.9 3.7  CL 89* 91*  CO2 44* 44*  GLUCOSE 149* 90  BUN 18 17  CREATININE 0.53 0.50  CALCIUM  9.5 9.4   Liver Function Tests: Recent Labs    12/20/23 2217  AST 14*  ALT 15  ALKPHOS 42  BILITOT 0.9  PROT 6.0*  ALBUMIN 3.2*   No results for input(s): LIPASE, AMYLASE in the last 72 hours. CBC: Recent Labs    12/19/23 0433  12/20/23 0312  WBC 8.2 7.1  HGB 14.8 14.1  HCT 48.3* 46.0  MCV 93.6 93.5  PLT 203 202   Cardiac Enzymes: No results for input(s): CKTOTAL, CKMB, CKMBINDEX, TROPONINIHS in the last 72 hours.  BNP: No results for input(s): BNP in the last 72 hours.  D-Dimer: Recent Labs    12/20/23 2217  DDIMER 0.37   Hemoglobin A1C: No results for input(s): HGBA1C in the last 72 hours. Fasting Lipid Panel: No results for input(s): CHOL, HDL, LDLCALC, TRIG, CHOLHDL, LDLDIRECT in the last 72 hours. Thyroid Function Tests: Recent Labs    12/21/23 0419  TSH 1.262   Anemia Panel: No results for input(s):  VITAMINB12, FOLATE, FERRITIN, TIBC, IRON, RETICCTPCT in the last 72 hours.   Radiology: CT HEAD WO CONTRAST ( ) Result Date: 12/21/2023 CLINICAL DATA:  Mental status change, unknown cause EXAM: CT HEAD WITHOUT CONTRAST TECHNIQUE: Contiguous axial images were obtained from the base of the skull through the vertex without intravenous contrast. RADIATION DOSE REDUCTION: This exam was performed according to the departmental dose-optimization program which includes automated exposure control, adjustment of the mA and/or kV according to patient size and/or use of iterative reconstruction technique. COMPARISON:  CT head November 24, 2019. FINDINGS: Brain: No evidence of acute infarction, hemorrhage, hydrocephalus, extra-axial collection or mass lesion/mass effect. Vascular: No hyperdense vessel or unexpected calcification. Skull: Normal. Negative for fracture or focal lesion. Sinuses/Orbits: No acute finding. IMPRESSION: No acute abnormality. Electronically Signed   By: Gilmore GORMAN Molt M.D.   On: 12/21/2023 02:23   CT CHEST WO CONTRAST Result Date: 12/21/2023 EXAM: CT CHEST WITHOUT CONTRAST 12/21/2023 01:48:42 AM TECHNIQUE: CT of the chest was performed without the administration of intravenous contrast. Multiplanar reformatted images are provided for review. Automated  exposure control, iterative reconstruction, and/or weight based adjustment of the mA/kV was utilized to reduce the radiation dose to as low as reasonably achievable. COMPARISON: 03/05/2020 CLINICAL HISTORY: Respiratory illness, nondiagnostic xray; acute resp failure with hypercapnea. FINDINGS: MEDIASTINUM: Heart and pericardium are unremarkable. The central airways are clear. Mild 3-vessel coronary atherosclerosis. Thoracic aortic atherosclerosis. LYMPH NODES: No mediastinal, hilar or axillary lymphadenopathy. LUNGS AND PLEURA: Moderate centrilobular and paraseptal emphysematous changes, upper lung predominant. Trace left pleural effusion with left basilar scarring/atelectasis. Complete right middle lobe collapse, chronic. No focal consolidation or pulmonary edema. No pneumothorax. SOFT TISSUES/BONES: Mild degenerative changes of the visualized thoracolumbar spine. No acute abnormality of the bones or soft tissues. UPPER ABDOMEN: Limited images of the upper abdomen demonstrates no acute abnormality. IMPRESSION: 1. No acute findings. 2. Trace left pleural effusion with left basilar scarring/atelectasis. 3. Complete right middle lobe collapse, chronic. Electronically signed by: Pinkie Pebbles MD 12/21/2023 01:54 AM EDT RP Workstation: [id number]   US  Venous Img Lower Bilateral (DVT) Result Date: 12/20/2023 CLINICAL DATA:  Lower extremity edema. EXAM: BILATERAL LOWER EXTREMITY VENOUS DOPPLER ULTRASOUND TECHNIQUE: Gray-scale sonography with graded compression, as well as color Doppler and duplex ultrasound were performed to evaluate the lower extremity deep venous systems from the level of the common femoral vein and including the common femoral, femoral, profunda femoral, popliteal and calf veins including the posterior tibial, peroneal and gastrocnemius veins when visible. The superficial great saphenous vein was also interrogated. Spectral Doppler was utilized to evaluate flow at rest and with distal  augmentation maneuvers in the common femoral, femoral and popliteal veins. COMPARISON:  None Available. FINDINGS: RIGHT LOWER EXTREMITY Common Femoral Vein: No evidence of thrombus. Normal compressibility, respiratory phasicity and response to augmentation. Saphenofemoral Junction: No evidence of thrombus. Normal compressibility and flow on color Doppler imaging. Profunda Femoral Vein: No evidence of thrombus. Normal compressibility and flow on color Doppler imaging. Femoral Vein: No evidence of thrombus. Normal compressibility, respiratory phasicity and response to augmentation. Popliteal Vein: No evidence of thrombus. Normal compressibility, respiratory phasicity and response to augmentation. Calf Veins: No evidence of thrombus. Normal compressibility and flow on color Doppler imaging. Superficial Great Saphenous Vein: No evidence of thrombus. Normal compressibility. Venous Reflux:  None. Other Findings: No evidence of superficial thrombophlebitis or abnormal fluid collection. LEFT LOWER EXTREMITY Common Femoral Vein: No evidence of thrombus. Normal compressibility, respiratory phasicity and response to augmentation. Saphenofemoral Junction: No evidence of thrombus.  Normal compressibility and flow on color Doppler imaging. Profunda Femoral Vein: No evidence of thrombus. Normal compressibility and flow on color Doppler imaging. Femoral Vein: No evidence of thrombus. Normal compressibility, respiratory phasicity and response to augmentation. Popliteal Vein: No evidence of thrombus. Normal compressibility, respiratory phasicity and response to augmentation. Calf Veins: No evidence of thrombus. Normal compressibility and flow on color Doppler imaging. Superficial Great Saphenous Vein: No evidence of thrombus. Normal compressibility. Venous Reflux:  None. Other Findings: No evidence of superficial thrombophlebitis or abnormal fluid collection. IMPRESSION: No evidence of deep venous thrombosis in either lower extremity.  Electronically Signed   By: Marcey Moan M.D.   On: 12/20/2023 16:25   DG Chest Port 1 View Result Date: 12/20/2023 CLINICAL DATA:  Hypoxia.  Congestive heart failure. EXAM: PORTABLE CHEST 1 VIEW COMPARISON:  12/17/2023 FINDINGS: Stable heart size and mediastinal contours. Increase in peribronchial thickening. There may be small pleural effusions. Atelectasis at the lung bases, more so on the left. No pneumothorax IMPRESSION: 1. Increase in peribronchial thickening, may represent bronchitis or pulmonary edema. 2. Possible small pleural effusions. Bibasilar atelectasis. Electronically Signed   By: Andrea Gasman M.D.   On: 12/20/2023 14:21   ECHOCARDIOGRAM COMPLETE Result Date: 12/18/2023    ECHOCARDIOGRAM REPORT   Patient Name:   Gloria Grimes Date of Exam: 12/18/2023 Medical Rec #:  [id number]      Height:       62.0 in Accession #:    [id number]     Weight:       151.2 lb Date of Birth:  10-May-1931      BSA:          1.698 m Patient Age:    89 years       BP:           138/59 mmHg Patient Gender: F              HR:           93 bpm. Exam Location:  [facility] Procedure: 2D Echo, Cardiac Doppler and Color Doppler (Both Spectral and Color            Flow Doppler were utilized during procedure). Indications:     CHF-acute diastolic I50.31  History:         Patient has no prior history of Echocardiogram examinations.                  CHF. Cancer.  Sonographer:     Christopher Furnace Referring Phys:  [id number] [name] Diagnosing Phys: [name] MD  Sonographer Comments: Suboptimal parasternal window and suboptimal apical window. Best view is subcostal. IMPRESSIONS  1. Left ventricular ejection fraction, by estimation, is 60 to 65%. The left ventricle has normal function. The left ventricle has no regional wall motion abnormalities. Left ventricular diastolic parameters are consistent with Grade I diastolic dysfunction (impaired relaxation).  2. Right ventricular systolic function is normal. The right  ventricular size is normal.  3. The mitral valve is normal in structure. Mild mitral valve regurgitation. No evidence of mitral stenosis.  4. The aortic valve is normal in structure. Aortic valve regurgitation is not visualized. No aortic stenosis is present.  5. The inferior vena cava is normal in size with greater than 50% respiratory variability, suggesting right atrial pressure of 3 mmHg. FINDINGS  Left Ventricle: Left ventricular ejection fraction, by estimation, is 60 to 65%. The left ventricle has normal function. The left ventricle has no regional wall  motion abnormalities. Strain was performed and the global longitudinal strain is indeterminate. The left ventricular internal cavity size was normal in size. There is no left ventricular hypertrophy. Left ventricular diastolic parameters are consistent with Grade I diastolic dysfunction (impaired relaxation). Right Ventricle: The right ventricular size is normal. No increase in right ventricular wall thickness. Right ventricular systolic function is normal. Left Atrium: Left atrial size was normal in size. Right Atrium: Right atrial size was normal in size. Pericardium: There is no evidence of pericardial effusion. Mitral Valve: The mitral valve is normal in structure. Mild mitral valve regurgitation. No evidence of mitral valve stenosis. MV peak gradient, 6.5 mmHg. The mean mitral valve gradient is 3.0 mmHg. Tricuspid Valve: The tricuspid valve is normal in structure. Tricuspid valve regurgitation is mild . No evidence of tricuspid stenosis. Aortic Valve: The aortic valve is normal in structure. Aortic valve regurgitation is not visualized. No aortic stenosis is present. Aortic valve mean gradient measures 3.0 mmHg. Aortic valve peak gradient measures 5.4 mmHg. Aortic valve area, by VTI measures 2.36 cm. Pulmonic Valve: The pulmonic valve was normal in structure. Pulmonic valve regurgitation is not visualized. No evidence of pulmonic stenosis. Aorta: The  aortic root is normal in size and structure. Venous: The inferior vena cava is normal in size with greater than 50% respiratory variability, suggesting right atrial pressure of 3 mmHg. IAS/Shunts: No atrial level shunt detected by color flow Doppler. Additional Comments: 3D was performed not requiring image post processing on an independent workstation and was indeterminate.  LEFT VENTRICLE PLAX 2D LVIDd:         4.30 cm LVIDs:         2.70 cm LV PW:         1.00 cm LV IVS:        1.00 cm LVOT diam:     2.00 cm LV SV:         53 LV SV Index:   31 LVOT Area:     3.14 cm  RIGHT VENTRICLE RV Basal diam:  3.90 cm RV Mid diam:    2.80 cm LEFT ATRIUM             Index        RIGHT ATRIUM           Index LA diam:        3.10 cm 1.83 cm/m   RA Area:     15.90 cm LA Vol (A2C):   32.1 ml 18.91 ml/m  RA Volume:   42.10 ml  24.80 ml/m LA Vol (A4C):   27.1 ml 15.96 ml/m LA Biplane Vol: 31.2 ml 18.38 ml/m  AORTIC VALVE AV Area (Vmax):    2.22 cm AV Area (Vmean):   1.92 cm AV Area (VTI):     2.36 cm AV Vmax:           116.50 cm/s AV Vmean:          85.650 cm/s AV VTI:            0.225 m AV Peak Grad:      5.4 mmHg AV Mean Grad:      3.0 mmHg LVOT Vmax:         82.20 cm/s LVOT Vmean:        52.400 cm/s LVOT VTI:          0.169 m LVOT/AV VTI ratio: 0.75  AORTA Ao Root diam: 2.40 cm MITRAL VALVE  TRICUSPID VALVE MV Area (PHT): 4.36 cm     TR Peak grad:   26.6 mmHg MV Area VTI:   2.34 cm     TR Vmax:        258.00 cm/s MV Peak grad:  6.5 mmHg MV Mean grad:  3.0 mmHg     SHUNTS MV Vmax:       1.27 m/s     Systemic VTI:  0.17 m MV Vmean:      84.8 cm/s    Systemic Diam: 2.00 cm MV Decel Time: 174 msec MV E velocity: 65.00 cm/s MV A velocity: 104.00 cm/s MV E/A ratio:  0.62 [name] MD Electronically signed by [name] MD Signature Date/Time: 12/18/2023/1:26:03 PM    Final    DG Chest 2 View Result Date: 12/17/2023 CLINICAL DATA:  Shortness of breath. EXAM: CHEST - 2 VIEW COMPARISON:  CT  chest 03/05/2020 and chest radiograph 11/25/2019. FINDINGS: Trachea is midline. Heart size stable. Thoracic aorta is calcified. Chronic right middle lobe collapse lungs are otherwise clear. Trace bilateral pleural effusions. IMPRESSION: 1. Trace bilateral pleural effusions. 2. Chronic right middle lobe collapse. Electronically Signed   By: Newell Eke M.D.   On: 12/17/2023 13:25    ECHO as above.  TELEMETRY reviewed by me 12/21/2023: Sinus rhythm rate 90s   EKG reviewed by me: Sinus rhythm, rate 86 bpm without acute ischemic changes.  Data reviewed by me 12/21/2023: last 24h vitals tele labs imaging I/O hospitalist progress notes.  Principal Problem:   CHF (congestive heart failure) (HCC)    ASSESSMENT AND PLAN:   Gloria Grimes is a 89 y.o. female  with a past medical history of chronic HFpEF, hypertension, PVD, cervical cancer, recent cataracts surgery of right eye 1 week ago who presented to the ED on 12/17/2023 for worsening shortness of breath for the past week, and endorses orthopnea.  Patient denies chest pain.  Cardiology was consulted for further evaluation of acute on chronic HFpEF.  # Acute on chronic HFpEF # Hypertension Patient with worsening SOB. NP elevated at 450.  CXR with trace bilateral pleural effusions, pulmonary vascular congestion.  Echo this admission with preserved EF of 60-65%, no RWMA, grade 1 diastolic dysfunction, mild MR.  Of note patient states she has lot of medication allergies and does not like starting new meds due to this.  Patient has been refusing losartan  and spironolactone , had long discussion regarding these heart failure related medications and patient is now agreeable. - Discontinue IV Lasix  due to worsening alkalosis. Will resume PO lasix  at lower dose 20 mg daily when alkalosis resolves. - Continue losartan   to 12.5 mg daily. Will uptitrate as BP allows - Continue PO metoprolol  succinate 25. - Continues spironolactone  12.5 mg daily.  Will  uptitrate as BP allows - Would like to initiate Farxiga, however co-pay is high at $150.  Patient prefers we do not initiate. - Recommend patient ambulate today.  # right middle lobe syndrome # Centrilobar emphysema with COPD -Pulmonology consulted, appreciate recommendations.   This patient's plan of care was discussed and created with Dr. Dooms and he is in agreement.  Signed: Dorene Comfort, PA-C  12/21/2023, 1:51 PM [facility]

## 2023-12-22 DIAGNOSIS — I5033 Acute on chronic diastolic (congestive) heart failure: Secondary | ICD-10-CM | POA: Diagnosis not present

## 2023-12-22 LAB — BASIC METABOLIC PANEL WITH GFR
Anion gap: 7 (ref 5–15)
BUN: 21 mg/dL (ref 8–23)
CO2: 33 mmol/L — ABNORMAL HIGH (ref 22–32)
Calcium: 10.6 mg/dL — ABNORMAL HIGH (ref 8.9–10.3)
Chloride: 96 mmol/L — ABNORMAL LOW (ref 98–111)
Creatinine, Ser: 0.53 mg/dL (ref 0.44–1.00)
GFR, Estimated: >60 mL/min (ref >60)
Glucose, Bld: 117 mg/dL — ABNORMAL HIGH (ref 70–99)
Potassium: 4.1 mmol/L (ref 3.5–5.1)
Sodium: 136 mmol/L (ref 135–145)

## 2023-12-22 MED ORDER — PREDNISONE 20 MG PO TABS
20.0000 mg | ORAL_TABLET | Freq: Every day | ORAL | Status: AC
  Administered 2023-12-23 – 2023-12-26 (×4): 20 mg via ORAL
  Filled 2023-12-22 (×4): qty 1

## 2023-12-22 MED ORDER — NAPHAZOLINE-GLYCERIN 0.012-0.25 % OP SOLN
1.0000 [drp] | Freq: Four times a day (QID) | OPHTHALMIC | Status: DC | PRN
  Administered 2023-12-23: 1 [drp] via OPHTHALMIC
  Filled 2023-12-22: qty 15

## 2023-12-22 NOTE — Plan of Care (Signed)

## 2023-12-22 NOTE — Progress Notes (Signed)
 PROGRESS NOTE    Gloria Grimes  FMW:[id number] DOB: 26-Jun-1930 DOA: 12/17/2023 PCP: Valora Agent, MD  Chief Complaint  Patient presents with   Shortness of Breath    Hospital Course:  Gloria Grimes is a 89 year old female with history of cervical cancer, CHF with preserved EF, recent cataract surgery involving the right eye, who presented with progressive shortness of breath and PND.  On arrival patient was found to be hypoxic to 80%, BNP 453, CXR with vascular congestion and chronic right middle lung collapse.  Patient was admitted for acute on chronic diastolic heart failure exacerbation  Subjective: No acute issues overnight.  Patient is doing much better this morning.  She is down to 2 L.  She does report that she wore CPAP briefly overnight but does not want to do it again.  She endorses discomfort with the mask and skin breakdown on her nose.  She may be more amendable to this outpatient if she can get a nasal cushion instead.  Objective: Vitals:   12/22/23 0738 12/22/23 0749 12/22/23 0945 12/22/23 1117  BP: (!) 128/48  (!) 128/48 (!) 111/56  Pulse: 86  86 72  Resp: 12   16  Temp: 97.9 F (36.6 C)   98 F (36.7 C)  TempSrc:      SpO2: 95% 93%  98%  Weight:      Height:        Intake/Output Summary (Last 24 hours) at 12/22/2023 1348 Last data filed at 12/22/2023 0900 Gross per 24 hour  Intake 480 ml  Output 400 ml  Net 80 ml   Filed Weights   12/20/23 0459 12/21/23 0519 12/22/23 0500  Weight: 73 kg 65.8 kg 66.6 kg    Examination: General exam: Appears calm and comfortable, NAD  Respiratory system: No work of breathing, symmetric chest wall expansion, no significant wheezing.  On 2 L without evidence of respiratory distress. Cardiovascular system: S1 & S2 heard, RRR.  Gastrointestinal system: Abdomen is nondistended, soft and nontender.  Neuro: Alert and oriented. No focal neurological deficits. Extremities: Legs wrapped bilaterally.  No obvious erythema or  edema  skin: No rashes, lesions Psychiatry: Demonstrates appropriate judgement and insight. Mood & affect appropriate for situation.   Assessment & Plan:  Principal Problem:   CHF (congestive heart failure) (HCC)    Hypercapnic and hypoxic respiratory failure Chronic right middle lung collapse - Hypercapnia likely multifactorial, perhaps some component of contraction alkalosis, complicated by chronic CO2 retention, suspected underlying OSA, chronic lung disease, +/- COPD flare - Back on nasal cannula, Lasix  has been discontinued as she is clinically euvolemic - Received Diamox  x 1. - Patient follows outpatient with pulmonology Dr. Aleskerov, he was consulted during this admission - Patient has been initiated on steroids, MetaNebs, and Levaquin  given concern for overlying COPD exacerbation. - No wheezing on exam today and significant improvement in O2 status.  Will taper steroids - Has required home O2 in the past though she was stable on room air as recently as 2 weeks ago - Encouraged incentive spirometry and flutter valve - If no improvement with new therapies we will set up for home O2 vs DC to SNF  Acute on chronic diastolic CHF - BNP elevated to 453 on arrival - Echo this admission: LVEF 60 to 65%, no RWMA, grade 1 diastolic dysfunction.  No significant valvular disease noted. - Have discontinued IV Lasix .  Patient is clinically euvolemic. - Continue with losartan , though in setting of preserved EF can consider  discontinuation to allow for greater diuresis as needed - As needed Lasix . -Cardiology following  Lower extremity swelling - This is chronic.  In some part exacerbated by acute CHF exacerbation.  Patient does have stents in the right leg and is followed by [facility]. - Dopplers negative for DVT - Per family patient report this is the best the patient's legs have looked in years  Tobacco abuse - Quit smoking 40 years prior. - Emphysematous changes on CT though patient does  not carry a diagnosis of COPD  Hypertension - Resume meds and titrate as needed  Generalized weakness - Physical therapy currently recommending SNF.  TOC has been consulted to assist in these arrangements - Extensive conversation with patient's daughter, Luke, at bedside today regarding overall goals and focus on quality of life in setting of advanced age and comorbidities.  Will continue all aggressive care as above.  Planning to discharge directly to rehab with hopes to return to baseline level of strength and allow for independence at home after SNF stay  DVT prophylaxis: Lovenox    Code Status: Limited: Do not attempt resuscitation (DNR) -DNR-LIMITED -Do Not Intubate/DNI  Disposition: Physical therapy currently recommending SNF, pending placement  Consultants:  Treatment Team:  Consulting Physician: Ammon Blunt, MD Consulting Physician: Parris Manna, MD  Procedures:    Antimicrobials:  Anti-infectives (From admission, onward)    Start     Dose/Rate Route Frequency Ordered Stop   12/22/23 1000  levofloxacin  (LEVAQUIN ) tablet 250 mg       Placed in Followed by Linked Group   250 mg Oral Daily 12/21/23 1417 12/26/23 0959   12/21/23 1600  levofloxacin  (LEVAQUIN ) IVPB 500 mg       Placed in Followed by Linked Group   500 mg 100 mL/hr over 60 Minutes Intravenous  Once 12/21/23 1417 12/21/23 1818   12/21/23 1445  levofloxacin  (LEVAQUIN ) IVPB 750 mg  Status:  Discontinued        750 mg 100 mL/hr over 90 Minutes Intravenous Every 24 hours 12/21/23 1346 12/21/23 1417       Data Reviewed: I have personally reviewed following labs and imaging studies CBC: Recent Labs  Lab 12/17/23 1315 12/18/23 0931 12/19/23 0433 12/20/23 0312  WBC 8.8 8.2 8.2 7.1  NEUTROABS  --  6.5  --   --   HGB 15.7* 15.5* 14.8 14.1  HCT 48.9* 48.7* 48.3* 46.0  MCV 88.7 91.4 93.6 93.5  PLT 234 232 203 202   Basic Metabolic Panel: Recent Labs  Lab 12/19/23 0433 12/20/23 0312  12/20/23 2217 12/21/23 0419 12/22/23 0232  NA 140 142 142 143 136  K 3.5 4.2 3.9 3.7 4.1  CL 91* 93* 89* 91* 96*  CO2 37* 38* 44* 44* 33*  GLUCOSE 116* 114* 149* 90 117*  BUN 20 17 18 17 21   CREATININE 0.59 0.53 0.53 0.50 0.53  CALCIUM  8.9 9.4 9.5 9.4 10.6*   GFR: Estimated Creatinine Clearance: 41 mL/min (by C-G formula based on SCr of 0.53 mg/dL). Liver Function Tests: Recent Labs  Lab 12/20/23 2217  AST 14*  ALT 15  ALKPHOS 42  BILITOT 0.9  PROT 6.0*  ALBUMIN 3.2*   CBG: Recent Labs  Lab 12/20/23 2158  GLUCAP 147*    No results found for this or any previous visit (from the past 240 hours).   Radiology Studies: CT HEAD WO CONTRAST ( ) Result Date: 12/21/2023 CLINICAL DATA:  Mental status change, unknown cause EXAM: CT HEAD WITHOUT CONTRAST TECHNIQUE: Contiguous axial images  were obtained from the base of the skull through the vertex without intravenous contrast. RADIATION DOSE REDUCTION: This exam was performed according to the departmental dose-optimization program which includes automated exposure control, adjustment of the mA and/or kV according to patient size and/or use of iterative reconstruction technique. COMPARISON:  CT head November 24, 2019. FINDINGS: Brain: No evidence of acute infarction, hemorrhage, hydrocephalus, extra-axial collection or mass lesion/mass effect. Vascular: No hyperdense vessel or unexpected calcification. Skull: Normal. Negative for fracture or focal lesion. Sinuses/Orbits: No acute finding. IMPRESSION: No acute abnormality. Electronically Signed   By: Gilmore GORMAN Molt M.D.   On: 12/21/2023 02:23   CT CHEST WO CONTRAST Result Date: 12/21/2023 EXAM: CT CHEST WITHOUT CONTRAST 12/21/2023 01:48:42 AM TECHNIQUE: CT of the chest was performed without the administration of intravenous contrast. Multiplanar reformatted images are provided for review. Automated exposure control, iterative reconstruction, and/or weight based adjustment of the mA/kV was  utilized to reduce the radiation dose to as low as reasonably achievable. COMPARISON: 03/05/2020 CLINICAL HISTORY: Respiratory illness, nondiagnostic xray; acute resp failure with hypercapnea. FINDINGS: MEDIASTINUM: Heart and pericardium are unremarkable. The central airways are clear. Mild 3-vessel coronary atherosclerosis. Thoracic aortic atherosclerosis. LYMPH NODES: No mediastinal, hilar or axillary lymphadenopathy. LUNGS AND PLEURA: Moderate centrilobular and paraseptal emphysematous changes, upper lung predominant. Trace left pleural effusion with left basilar scarring/atelectasis. Complete right middle lobe collapse, chronic. No focal consolidation or pulmonary edema. No pneumothorax. SOFT TISSUES/BONES: Mild degenerative changes of the visualized thoracolumbar spine. No acute abnormality of the bones or soft tissues. UPPER ABDOMEN: Limited images of the upper abdomen demonstrates no acute abnormality. IMPRESSION: 1. No acute findings. 2. Trace left pleural effusion with left basilar scarring/atelectasis. 3. Complete right middle lobe collapse, chronic. Electronically signed by: Pinkie Pebbles MD 12/21/2023 01:54 AM EDT RP Workstation: [id number]    Scheduled Meds:  enoxaparin  (LOVENOX ) injection  40 mg Subcutaneous Q24H   ipratropium-albuterol   3 mL Nebulization TID   levofloxacin   250 mg Oral Daily   loratadine   10 mg Oral Daily   losartan   12.5 mg Oral Daily   methylPREDNISolone  (SOLU-MEDROL ) injection  40 mg Intravenous Daily   metoprolol  succinate  25 mg Oral Daily   Prednisolon-Moxiflox-Bromfenac   1 drop Right Eye BID   spironolactone   25 mg Oral Daily   Continuous Infusions:     LOS: 5 days  MDM: Patient is high risk for one or more organ failure.  They necessitate ongoing hospitalization for continued IV therapies and subsequent lab monitoring. Total time spent interpreting labs and vitals, reviewing the medical record, coordinating care amongst consultants and care team members,  directly assessing and discussing care with the patient and/or family: 55 min  Jodette Wik, DO [facility]  To contact the attending physician between 7A-7P please use Epic Chat. To contact the covering physician during after hours 7P-7A, please review Amion.  12/22/2023, 1:48 PM   *This document has been created with the assistance of dictation software. Please excuse typographical errors. *

## 2023-12-22 NOTE — Progress Notes (Signed)
 [facility] CLINIC CARDIOLOGY PROGRESS NOTE       Patient ID: Gloria Grimes MRN: [id number] DOB/AGE: [birth year] 89 y.o.  Admit date: 12/17/2023 Referring Physician dr. Drue Potter Primary Physician Valora Agent, MD Primary Cardiologist Dr. Ammon Reason for Consultation CHF exacerbation   HPI: Gloria Grimes is a 89 y.o. female  with a past medical history of chronic HFpEF, hypertension, PVD, cervical cancer, recent cataracts surgery of right eye 1 week ago who presented to the ED on 12/17/2023 for worsening shortness of breath for the past week, and endorses orthopnea.  Patient denies chest pain.  Cardiology was consulted for further evaluation of acute on chronic HFpEF.  Interval History: -Patient seen and examined this AM sitting upright on side of hospital bed. SOB is stable. Continues to deny chest pain, palpitations or lightheadedness.  -Trace LEE today, around baseline, wraps in place. -Patients BP and HR stable this AM. Overnight Tele showed no significant events.  -Patient weaned off BiPAP around 11 AM 7/25 to 2L [place] with stable SpO2.     Review of systems complete and found to be negative unless listed above    Past Medical History:  Diagnosis Date   Cancer (HCC)    ovarian   CHF (congestive heart failure) (HCC)     Past Surgical History:  Procedure Laterality Date   ABDOMINAL HYSTERECTOMY     CATARACT EXTRACTION W/PHACO Right 12/12/2023   Procedure: PHACOEMULSIFICATION, CATARACT, WITH IOL INSERTION 12.79 01:03.7;  Surgeon: Mittie Gaskin, MD;  Location: [facility] ORS;  Service: Ophthalmology;  Laterality: Right;   LOWER EXTREMITY ANGIOGRAPHY Right 12/16/2020   Procedure: LOWER EXTREMITY ANGIOGRAPHY;  Surgeon: Marea Selinda RAMAN, MD;  Location: [facility] INVASIVE CV LAB;  Service: Cardiovascular;  Laterality: Right;    Medications Prior to Admission  Medication Sig Dispense Refill Last Dose/Taking   ascorbic acid  (VITAMIN C ) 500 MG tablet Take 500 mg by mouth daily.    12/16/2023 Evening   carvedilol  (COREG ) 6.25 MG tablet Take 6.25 mg by mouth 2 (two) times daily with a meal.   12/16/2023 Evening   cetirizine (ZYRTEC) 10 MG tablet Take 10 mg by mouth daily.   12/16/2023 Evening   furosemide  (LASIX ) 40 MG tablet Take 40 mg by mouth daily.   12/17/2023 Morning   Lactobacillus-Inulin (CULTURELLE DIGESTIVE HEALTH) CAPS Take by mouth.   Unknown   aspirin EC 81 MG tablet Take 81 mg by mouth daily. Swallow whole. (Patient not taking: Reported on 12/04/2023)      Social History   Socioeconomic History   Marital status: Widowed    Spouse name: Not on file   Number of children: Not on file   Years of education: Not on file   Highest education level: Not on file  Occupational History   Occupation: retired  Tobacco Use   Smoking status: Former   Smokeless tobacco: Never  Substance and Sexual Activity   Alcohol use: No    Comment: wine   Drug use: No   Sexual activity: Never    Birth control/protection: None  Other Topics Concern   Not on file  Social History Narrative   Not on file   Social Drivers of Health   Financial Resource Strain: Patient Declined (12/20/2022)   Received from [facility]   Overall Financial Resource Strain (CARDIA)    Difficulty of Paying Living Expenses: Patient declined  Food Insecurity: No Food Insecurity (12/18/2023)   Hunger Vital Sign    Worried About Running Out of Food  in the Last Year: Never true    Ran Out of Food in the Last Year: Never true  Transportation Needs: No Transportation Needs (12/18/2023)   PRAPARE - Administrator, Civil Service (Medical): No    Lack of Transportation (Non-Medical): No  Physical Activity: Not on file  Stress: Not on file  Social Connections: Moderately Isolated (12/18/2023)   Social Connection and Isolation Panel    Frequency of Communication with Friends and Family: More than three times a week    Frequency of Social Gatherings with Friends and Family: Three  times a week    Attends Religious Services: 1 to 4 times per year    Active Member of Clubs or Organizations: No    Attends Banker Meetings: Never    Marital Status: Widowed  Intimate Partner Violence: Not At Risk (12/18/2023)   Humiliation, Afraid, Rape, and Kick questionnaire    Fear of Current or Ex-Partner: No    Emotionally Abused: No    Physically Abused: No    Sexually Abused: No    History reviewed. No pertinent family history.   Vitals:   12/22/23 0500 12/22/23 0738 12/22/23 0749 12/22/23 0945  BP:  (!) 128/48  (!) 128/48  Pulse:  86  86  Resp:  12    Temp:  97.9 F (36.6 C)    TempSrc:      SpO2:  95% 93%   Weight: 66.6 kg     Height:        PHYSICAL EXAM General: Well-appearing elderly female, well nourished, in no acute distress. HEENT: Normocephalic and atraumatic. Neck: Elevated JVD.   Lungs: Normal respiratory effort on 2L.  Diminished breath sounds bilaterally Heart: HRRR. Normal S1 and S2 without gallops or murmurs.  Abdomen: Non-distended appearing.  Msk: Normal strength and tone for age. Extremities: Warm and well perfused. No clubbing, cyanosis,  pedal edema around baseline with TED hose in place. Neuro: Alert and oriented X 3. Psych: Answers questions appropriately.   Labs: Basic Metabolic Panel: Recent Labs    12/21/23 0419 12/22/23 0232  NA 143 136  K 3.7 4.1  CL 91* 96*  CO2 44* 33*  GLUCOSE 90 117*  BUN 17 21  CREATININE 0.50 0.53  CALCIUM  9.4 10.6*   Liver Function Tests: Recent Labs    12/20/23 2217  AST 14*  ALT 15  ALKPHOS 42  BILITOT 0.9  PROT 6.0*  ALBUMIN 3.2*   No results for input(s): LIPASE, AMYLASE in the last 72 hours. CBC: Recent Labs    12/20/23 0312  WBC 7.1  HGB 14.1  HCT 46.0  MCV 93.5  PLT 202   Cardiac Enzymes: No results for input(s): CKTOTAL, CKMB, CKMBINDEX, TROPONINIHS in the last 72 hours.  BNP: No results for input(s): BNP in the last 72  hours.  D-Dimer: Recent Labs    12/20/23 2217  DDIMER 0.37   Hemoglobin A1C: No results for input(s): HGBA1C in the last 72 hours. Fasting Lipid Panel: No results for input(s): CHOL, HDL, LDLCALC, TRIG, CHOLHDL, LDLDIRECT in the last 72 hours. Thyroid Function Tests: Recent Labs    12/21/23 0419  TSH 1.262   Anemia Panel: No results for input(s): VITAMINB12, FOLATE, FERRITIN, TIBC, IRON, RETICCTPCT in the last 72 hours.   Radiology: CT HEAD WO CONTRAST ( ) Result Date: 12/21/2023 CLINICAL DATA:  Mental status change, unknown cause EXAM: CT HEAD WITHOUT CONTRAST TECHNIQUE: Contiguous axial images were obtained from the base of the skull through  the vertex without intravenous contrast. RADIATION DOSE REDUCTION: This exam was performed according to the departmental dose-optimization program which includes automated exposure control, adjustment of the mA and/or kV according to patient size and/or use of iterative reconstruction technique. COMPARISON:  CT head November 24, 2019. FINDINGS: Brain: No evidence of acute infarction, hemorrhage, hydrocephalus, extra-axial collection or mass lesion/mass effect. Vascular: No hyperdense vessel or unexpected calcification. Skull: Normal. Negative for fracture or focal lesion. Sinuses/Orbits: No acute finding. IMPRESSION: No acute abnormality. Electronically Signed   By: Gilmore GORMAN Molt M.D.   On: 12/21/2023 02:23   CT CHEST WO CONTRAST Result Date: 12/21/2023 EXAM: CT CHEST WITHOUT CONTRAST 12/21/2023 01:48:42 AM TECHNIQUE: CT of the chest was performed without the administration of intravenous contrast. Multiplanar reformatted images are provided for review. Automated exposure control, iterative reconstruction, and/or weight based adjustment of the mA/kV was utilized to reduce the radiation dose to as low as reasonably achievable. COMPARISON: 03/05/2020 CLINICAL HISTORY: Respiratory illness, nondiagnostic xray; acute resp failure  with hypercapnea. FINDINGS: MEDIASTINUM: Heart and pericardium are unremarkable. The central airways are clear. Mild 3-vessel coronary atherosclerosis. Thoracic aortic atherosclerosis. LYMPH NODES: No mediastinal, hilar or axillary lymphadenopathy. LUNGS AND PLEURA: Moderate centrilobular and paraseptal emphysematous changes, upper lung predominant. Trace left pleural effusion with left basilar scarring/atelectasis. Complete right middle lobe collapse, chronic. No focal consolidation or pulmonary edema. No pneumothorax. SOFT TISSUES/BONES: Mild degenerative changes of the visualized thoracolumbar spine. No acute abnormality of the bones or soft tissues. UPPER ABDOMEN: Limited images of the upper abdomen demonstrates no acute abnormality. IMPRESSION: 1. No acute findings. 2. Trace left pleural effusion with left basilar scarring/atelectasis. 3. Complete right middle lobe collapse, chronic. Electronically signed by: Pinkie Pebbles MD 12/21/2023 01:54 AM EDT RP Workstation: [id number]   US  Venous Img Lower Bilateral (DVT) Result Date: 12/20/2023 CLINICAL DATA:  Lower extremity edema. EXAM: BILATERAL LOWER EXTREMITY VENOUS DOPPLER ULTRASOUND TECHNIQUE: Gray-scale sonography with graded compression, as well as color Doppler and duplex ultrasound were performed to evaluate the lower extremity deep venous systems from the level of the common femoral vein and including the common femoral, femoral, profunda femoral, popliteal and calf veins including the posterior tibial, peroneal and gastrocnemius veins when visible. The superficial great saphenous vein was also interrogated. Spectral Doppler was utilized to evaluate flow at rest and with distal augmentation maneuvers in the common femoral, femoral and popliteal veins. COMPARISON:  None Available. FINDINGS: RIGHT LOWER EXTREMITY Common Femoral Vein: No evidence of thrombus. Normal compressibility, respiratory phasicity and response to augmentation. Saphenofemoral  Junction: No evidence of thrombus. Normal compressibility and flow on color Doppler imaging. Profunda Femoral Vein: No evidence of thrombus. Normal compressibility and flow on color Doppler imaging. Femoral Vein: No evidence of thrombus. Normal compressibility, respiratory phasicity and response to augmentation. Popliteal Vein: No evidence of thrombus. Normal compressibility, respiratory phasicity and response to augmentation. Calf Veins: No evidence of thrombus. Normal compressibility and flow on color Doppler imaging. Superficial Great Saphenous Vein: No evidence of thrombus. Normal compressibility. Venous Reflux:  None. Other Findings: No evidence of superficial thrombophlebitis or abnormal fluid collection. LEFT LOWER EXTREMITY Common Femoral Vein: No evidence of thrombus. Normal compressibility, respiratory phasicity and response to augmentation. Saphenofemoral Junction: No evidence of thrombus. Normal compressibility and flow on color Doppler imaging. Profunda Femoral Vein: No evidence of thrombus. Normal compressibility and flow on color Doppler imaging. Femoral Vein: No evidence of thrombus. Normal compressibility, respiratory phasicity and response to augmentation. Popliteal Vein: No evidence of thrombus. Normal compressibility, respiratory phasicity and  response to augmentation. Calf Veins: No evidence of thrombus. Normal compressibility and flow on color Doppler imaging. Superficial Great Saphenous Vein: No evidence of thrombus. Normal compressibility. Venous Reflux:  None. Other Findings: No evidence of superficial thrombophlebitis or abnormal fluid collection. IMPRESSION: No evidence of deep venous thrombosis in either lower extremity. Electronically Signed   By: Marcey Moan M.D.   On: 12/20/2023 16:25   DG Chest Port 1 View Result Date: 12/20/2023 CLINICAL DATA:  Hypoxia.  Congestive heart failure. EXAM: PORTABLE CHEST 1 VIEW COMPARISON:  12/17/2023 FINDINGS: Stable heart size and mediastinal  contours. Increase in peribronchial thickening. There may be small pleural effusions. Atelectasis at the lung bases, more so on the left. No pneumothorax IMPRESSION: 1. Increase in peribronchial thickening, may represent bronchitis or pulmonary edema. 2. Possible small pleural effusions. Bibasilar atelectasis. Electronically Signed   By: Andrea Gasman M.D.   On: 12/20/2023 14:21   ECHOCARDIOGRAM COMPLETE Result Date: 12/18/2023    ECHOCARDIOGRAM REPORT   Patient Name:   Gloria Grimes Date of Exam: 12/18/2023 Medical Rec #:  [id number]      Height:       62.0 in Accession #:    [id number]     Weight:       151.2 lb Date of Birth:  April 08, 1931      BSA:          1.698 m Patient Age:    89 years       BP:           138/59 mmHg Patient Gender: F              HR:           93 bpm. Exam Location:  [facility] Procedure: 2D Echo, Cardiac Doppler and Color Doppler (Both Spectral and Color            Flow Doppler were utilized during procedure). Indications:     CHF-acute diastolic I50.31  History:         Patient has no prior history of Echocardiogram examinations.                  CHF. Cancer.  Sonographer:     Christopher Furnace Referring Phys:  [id number] [name] Diagnosing Phys: [name] MD  Sonographer Comments: Suboptimal parasternal window and suboptimal apical window. Best view is subcostal. IMPRESSIONS  1. Left ventricular ejection fraction, by estimation, is 60 to 65%. The left ventricle has normal function. The left ventricle has no regional wall motion abnormalities. Left ventricular diastolic parameters are consistent with Grade I diastolic dysfunction (impaired relaxation).  2. Right ventricular systolic function is normal. The right ventricular size is normal.  3. The mitral valve is normal in structure. Mild mitral valve regurgitation. No evidence of mitral stenosis.  4. The aortic valve is normal in structure. Aortic valve regurgitation is not visualized. No aortic stenosis is present.  5. The inferior  vena cava is normal in size with greater than 50% respiratory variability, suggesting right atrial pressure of 3 mmHg. FINDINGS  Left Ventricle: Left ventricular ejection fraction, by estimation, is 60 to 65%. The left ventricle has normal function. The left ventricle has no regional wall motion abnormalities. Strain was performed and the global longitudinal strain is indeterminate. The left ventricular internal cavity size was normal in size. There is no left ventricular hypertrophy. Left ventricular diastolic parameters are consistent with Grade I diastolic dysfunction (impaired relaxation). Right Ventricle: The right ventricular size is  normal. No increase in right ventricular wall thickness. Right ventricular systolic function is normal. Left Atrium: Left atrial size was normal in size. Right Atrium: Right atrial size was normal in size. Pericardium: There is no evidence of pericardial effusion. Mitral Valve: The mitral valve is normal in structure. Mild mitral valve regurgitation. No evidence of mitral valve stenosis. MV peak gradient, 6.5 mmHg. The mean mitral valve gradient is 3.0 mmHg. Tricuspid Valve: The tricuspid valve is normal in structure. Tricuspid valve regurgitation is mild . No evidence of tricuspid stenosis. Aortic Valve: The aortic valve is normal in structure. Aortic valve regurgitation is not visualized. No aortic stenosis is present. Aortic valve mean gradient measures 3.0 mmHg. Aortic valve peak gradient measures 5.4 mmHg. Aortic valve area, by VTI measures 2.36 cm. Pulmonic Valve: The pulmonic valve was normal in structure. Pulmonic valve regurgitation is not visualized. No evidence of pulmonic stenosis. Aorta: The aortic root is normal in size and structure. Venous: The inferior vena cava is normal in size with greater than 50% respiratory variability, suggesting right atrial pressure of 3 mmHg. IAS/Shunts: No atrial level shunt detected by color flow Doppler. Additional Comments: 3D was  performed not requiring image post processing on an independent workstation and was indeterminate.  LEFT VENTRICLE PLAX 2D LVIDd:         4.30 cm LVIDs:         2.70 cm LV PW:         1.00 cm LV IVS:        1.00 cm LVOT diam:     2.00 cm LV SV:         53 LV SV Index:   31 LVOT Area:     3.14 cm  RIGHT VENTRICLE RV Basal diam:  3.90 cm RV Mid diam:    2.80 cm LEFT ATRIUM             Index        RIGHT ATRIUM           Index LA diam:        3.10 cm 1.83 cm/m   RA Area:     15.90 cm LA Vol (A2C):   32.1 ml 18.91 ml/m  RA Volume:   42.10 ml  24.80 ml/m LA Vol (A4C):   27.1 ml 15.96 ml/m LA Biplane Vol: 31.2 ml 18.38 ml/m  AORTIC VALVE AV Area (Vmax):    2.22 cm AV Area (Vmean):   1.92 cm AV Area (VTI):     2.36 cm AV Vmax:           116.50 cm/s AV Vmean:          85.650 cm/s AV VTI:            0.225 m AV Peak Grad:      5.4 mmHg AV Mean Grad:      3.0 mmHg LVOT Vmax:         82.20 cm/s LVOT Vmean:        52.400 cm/s LVOT VTI:          0.169 m LVOT/AV VTI ratio: 0.75  AORTA Ao Root diam: 2.40 cm MITRAL VALVE                TRICUSPID VALVE MV Area (PHT): 4.36 cm     TR Peak grad:   26.6 mmHg MV Area VTI:   2.34 cm     TR Vmax:        258.00 cm/s  MV Peak grad:  6.5 mmHg MV Mean grad:  3.0 mmHg     SHUNTS MV Vmax:       1.27 m/s     Systemic VTI:  0.17 m MV Vmean:      84.8 cm/s    Systemic Diam: 2.00 cm MV Decel Time: 174 msec MV E velocity: 65.00 cm/s MV A velocity: 104.00 cm/s MV E/A ratio:  0.62 [name] MD Electronically signed by [name] MD Signature Date/Time: 12/18/2023/1:26:03 PM    Final    DG Chest 2 View Result Date: 12/17/2023 CLINICAL DATA:  Shortness of breath. EXAM: CHEST - 2 VIEW COMPARISON:  CT chest 03/05/2020 and chest radiograph 11/25/2019. FINDINGS: Trachea is midline. Heart size stable. Thoracic aorta is calcified. Chronic right middle lobe collapse lungs are otherwise clear. Trace bilateral pleural effusions. IMPRESSION: 1. Trace bilateral pleural effusions. 2.  Chronic right middle lobe collapse. Electronically Signed   By: Newell Eke M.D.   On: 12/17/2023 13:25    ECHO as above.  TELEMETRY reviewed by me 12/22/2023: Sinus rhythm rate 90s   EKG reviewed by me: Sinus rhythm, rate 86 bpm without acute ischemic changes.  Data reviewed by me 12/22/2023: last 24h vitals tele labs imaging I/O hospitalist progress notes.  Principal Problem:   CHF (congestive heart failure) (HCC)    ASSESSMENT AND PLAN:   Gloria Grimes is a 89 y.o. female  with a past medical history of chronic HFpEF, hypertension, PVD, cervical cancer, recent cataracts surgery of right eye 1 week ago who presented to the ED on 12/17/2023 for worsening shortness of breath for the past week, and endorses orthopnea.  Patient denies chest pain.  Cardiology was consulted for further evaluation of acute on chronic HFpEF.  # Acute on chronic HFpEF # Hypertension Patient with worsening SOB. NP elevated at 450.  CXR with trace bilateral pleural effusions, pulmonary vascular congestion.  Echo this admission with preserved EF of 60-65%, no RWMA, grade 1 diastolic dysfunction, mild MR.  Of note patient states she has lot of medication allergies and does not like starting new meds due to this.  Patient has been refusing losartan  and spironolactone , had long discussion regarding these heart failure related medications and patient is now agreeable. -Will resume PO lasix  at lower dose 20 mg daily when alkalosis resolves, likely tomorrow.  -Continue losartan  12.5 mg daily. Will uptitrate as BP allows -Continue PO metoprolol  succinate 25. -Continues spironolactone  12.5 mg daily.  Will uptitrate as BP allows -Would like to initiate Farxiga, however co-pay is high at $150.  Patient prefers we do not initiate. -Recommend patient ambulate today.  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   This patient's plan of care was  discussed and created with Dr. Florencio and he is in agreement.  Signed: Danita Bloch, PA-C  12/22/2023, 10:16 AM [facility]

## 2023-12-23 DIAGNOSIS — I5033 Acute on chronic diastolic (congestive) heart failure: Secondary | ICD-10-CM | POA: Diagnosis not present

## 2023-12-23 LAB — COMPREHENSIVE METABOLIC PANEL WITH GFR
ALT: 12 U/L (ref 0–44)
AST: 13 U/L — ABNORMAL LOW (ref 15–41)
Albumin: 3 g/dL — ABNORMAL LOW (ref 3.5–5.0)
Alkaline Phosphatase: 36 U/L — ABNORMAL LOW (ref 38–126)
Anion gap: 7 (ref 5–15)
BUN: 25 mg/dL — ABNORMAL HIGH (ref 8–23)
CO2: 34 mmol/L — ABNORMAL HIGH (ref 22–32)
Calcium: 10.4 mg/dL — ABNORMAL HIGH (ref 8.9–10.3)
Chloride: 98 mmol/L (ref 98–111)
Creatinine, Ser: 0.54 mg/dL (ref 0.44–1.00)
GFR, Estimated: >60 mL/min (ref >60)
Glucose, Bld: 88 mg/dL (ref 70–99)
Potassium: 4 mmol/L (ref 3.5–5.1)
Sodium: 139 mmol/L (ref 135–145)
Total Bilirubin: 0.6 mg/dL (ref 0.0–1.2)
Total Protein: 5.6 g/dL — ABNORMAL LOW (ref 6.5–8.1)

## 2023-12-23 MED ORDER — LACTULOSE 10 GM/15ML PO SOLN
20.0000 g | Freq: Three times a day (TID) | ORAL | Status: DC
  Administered 2023-12-23 – 2023-12-25 (×6): 20 g via ORAL
  Filled 2023-12-23 (×8): qty 30

## 2023-12-23 MED ORDER — FUROSEMIDE 40 MG PO TABS: 40.0000 mg | ORAL_TABLET | Freq: Every day | ORAL | Status: DC

## 2023-12-23 MED ORDER — FUROSEMIDE 20 MG PO TABS
20.0000 mg | ORAL_TABLET | Freq: Every day | ORAL | Status: DC
  Administered 2023-12-23 – 2023-12-25 (×3): 20 mg via ORAL
  Filled 2023-12-23 (×4): qty 1

## 2023-12-23 NOTE — Progress Notes (Signed)
 PROGRESS NOTE    Gloria Grimes  FMW:[id number] DOB: 03-19-1931 DOA: 12/17/2023 PCP: Valora Agent, MD  Chief Complaint  Patient presents with   Shortness of Breath    Hospital Course:  Gloria Grimes is a 89 year old female with history of cervical cancer, CHF with preserved EF, recent cataract surgery involving the right eye, who presented with progressive shortness of breath and PND.  On arrival patient was found to be hypoxic to 80%, BNP 453, CXR with vascular congestion and chronic right middle lung collapse.  Patient was admitted for acute on chronic diastolic heart failure exacerbation  Subjective: No acute events overnight.  Patient is in good spirits today.  She has been working with her incentive spirometer.  She was able to take a walk around the nurses station with assistance and walker yesterday.  She does have some constipation and would appreciate medication assistance with this.  She remains very hopeful for discharge to skilled nursing facility tomorrow.  Objective: Vitals:   12/23/23 0500 12/23/23 0736 12/23/23 0824 12/23/23 1139  BP:  (!) 121/49  (!) 110/47  Pulse:  81  83  Resp:  (!) 21  20  Temp:  97.7 F (36.5 C)  97.6 F (36.4 C)  TempSrc:      SpO2:  95% 97% 100%  Weight: 67.1 kg     Height:        Intake/Output Summary (Last 24 hours) at 12/23/2023 1243 Last data filed at 12/23/2023 0900 Gross per 24 hour  Intake 480 ml  Output --  Net 480 ml   Filed Weights   12/21/23 0519 12/22/23 0500 12/23/23 0500  Weight: 65.8 kg 66.6 kg 67.1 kg    Examination: General exam: Appears calm and comfortable, NAD  Respiratory system: No work of breathing, symmetric chest wall expansion, no significant wheezing.  On 2 L without evidence of respiratory distress. Cardiovascular system: S1 & S2 heard, RRR.  Gastrointestinal system: Abdomen is nondistended, soft and nontender.  Neuro: Alert and oriented. No focal neurological deficits. Extremities: Legs wrapped  bilaterally.  No obvious erythema or edema  skin: No rashes, lesions Psychiatry: Demonstrates appropriate judgement and insight. Mood & affect appropriate for situation.   Assessment & Plan:  Principal Problem:   CHF (congestive heart failure) (HCC)    Hypercapnic and hypoxic respiratory failure Chronic right middle lung collapse - Hypercapnia likely multifactorial, perhaps some component of contraction alkalosis, complicated by chronic CO2 retention, suspected underlying OSA, chronic lung disease, +/- COPD flare - Much improved now.  On 2 L nasal cannula - Patient follows outpatient with pulmonology Dr. Aleskerov, he was consulted during this admission - On steroids, MetaNebs, Levaquin  x 4 days. - Continue with steroid taper - No wheezing on exam with improvement in aeration and hypoxia. - Continue to wean oxygen as tolerated - Continue to encourage incentive spirometry and flutter valve - Has required home oxygen in the past though she has been stable on room air as recently as 2 weeks ago.  Would benefit from brief SNF stay given her recent deconditioning.  Acute on chronic diastolic CHF - BNP elevated to 453 on arrival - Echo this admission: LVEF 60 to 65%, no RWMA, grade 1 diastolic dysfunction.  No significant valvular disease noted. - Have discontinued IV Lasix .  Patient is clinically euvolemic. - Continue with losartan , though in setting of preserved EF can consider discontinuation to allow for greater diuresis as needed - Cardiology has resumed daily Lasix .  Will proceed with 20  mg daily.  Low threshold to discontinue this in setting of euvolemia and only grade 1 diastolic dysfunction  Lower extremity swelling - This is chronic.  In some part exacerbated by acute CHF exacerbation.  Patient does have stents in the right leg and is followed by [facility]. - Dopplers negative for DVT - Per family patient report this is the best the patient's legs have looked in years. - Continue leg  wrapping  Tobacco abuse - Quit smoking 40 years prior. - Emphysematous changes on CT though patient does not carry a diagnosis of COPD  Hypertension - Resume meds and titrate as needed  Generalized weakness - Physical therapy currently recommending SNF.  TOC has been consulted to assist in these arrangements - Patient is very deconditioned compared to her baseline.  She was previously independent and easily ambulatory.  She is improving now and has been working well with physical therapy.  She is using a walker but remains deconditioned and requires 24/7 standby assistance.  She would do well with short 2-week SNF stay to return to her previous level of functioning and independence  DVT prophylaxis: Lovenox    Code Status: Limited: Do not attempt resuscitation (DNR) -DNR-LIMITED -Do Not Intubate/DNI  Disposition: Medically ready for discharge.  Pending SNF placement.  TOC aware  Consultants:  Treatment Team:  Consulting Physician: Ammon Blunt, MD Consulting Physician: Parris Manna, MD  Procedures:    Antimicrobials:  Anti-infectives (From admission, onward)    Start     Dose/Rate Route Frequency Ordered Stop   12/22/23 1000  levofloxacin  (LEVAQUIN ) tablet 250 mg       Placed in Followed by Linked Group   250 mg Oral Daily 12/21/23 1417 12/26/23 0959   12/21/23 1600  levofloxacin  (LEVAQUIN ) IVPB 500 mg       Placed in Followed by Linked Group   500 mg 100 mL/hr over 60 Minutes Intravenous  Once 12/21/23 1417 12/21/23 1818   12/21/23 1445  levofloxacin  (LEVAQUIN ) IVPB 750 mg  Status:  Discontinued        750 mg 100 mL/hr over 90 Minutes Intravenous Every 24 hours 12/21/23 1346 12/21/23 1417       Data Reviewed: I have personally reviewed following labs and imaging studies CBC: Recent Labs  Lab 12/17/23 1315 12/18/23 0931 12/19/23 0433 12/20/23 0312  WBC 8.8 8.2 8.2 7.1  NEUTROABS  --  6.5  --   --   HGB 15.7* 15.5* 14.8 14.1  HCT 48.9* 48.7* 48.3*  46.0  MCV 88.7 91.4 93.6 93.5  PLT 234 232 203 202   Basic Metabolic Panel: Recent Labs  Lab 12/20/23 0312 12/20/23 2217 12/21/23 0419 12/22/23 0232 12/23/23 0839  NA 142 142 143 136 139  K 4.2 3.9 3.7 4.1 4.0  CL 93* 89* 91* 96* 98  CO2 38* 44* 44* 33* 34*  GLUCOSE 114* 149* 90 117* 88  BUN 17 18 17 21  25*  CREATININE 0.53 0.53 0.50 0.53 0.54  CALCIUM  9.4 9.5 9.4 10.6* 10.4*   GFR: Estimated Creatinine Clearance: 41.1 mL/min (by C-G formula based on SCr of 0.54 mg/dL). Liver Function Tests: Recent Labs  Lab 12/20/23 2217 12/23/23 0839  AST 14* 13*  ALT 15 12  ALKPHOS 42 36*  BILITOT 0.9 0.6  PROT 6.0* 5.6*  ALBUMIN 3.2* 3.0*   CBG: Recent Labs  Lab 12/20/23 2158  GLUCAP 147*    No results found for this or any previous visit (from the past 240 hours).   Radiology Studies:  No results found.   Scheduled Meds:  enoxaparin  (LOVENOX ) injection  40 mg Subcutaneous Q24H   furosemide   20 mg Oral Daily   ipratropium-albuterol   3 mL Nebulization TID   lactulose   20 g Oral TID   levofloxacin   250 mg Oral Daily   loratadine   10 mg Oral Daily   losartan   12.5 mg Oral Daily   metoprolol  succinate  25 mg Oral Daily   Prednisolon-Moxiflox-Bromfenac   1 drop Right Eye BID   predniSONE   20 mg Oral Q breakfast   spironolactone   25 mg Oral Daily   Continuous Infusions:     LOS: 6 days  MDM: Patient is high risk for one or more organ failure.  They necessitate ongoing hospitalization for continued IV therapies and subsequent lab monitoring. Total time spent interpreting labs and vitals, reviewing the medical record, coordinating care amongst consultants and care team members, directly assessing and discussing care with the patient and/or family: 55 min  Claudia Greenley, DO [facility]  To contact the attending physician between 7A-7P please use Epic Chat. To contact the covering physician during after hours 7P-7A, please review Amion.  12/23/2023, 12:43 PM    *This document has been created with the assistance of dictation software. Please excuse typographical errors. *

## 2023-12-23 NOTE — Progress Notes (Signed)
 12/23/2023 10:17 PM Report given to nurse on 1A, pt transported to bed 129. Gloria Grimes

## 2023-12-23 NOTE — TOC Progression Note (Signed)
 Transition of Care [facility]) - Progression Note    Patient Details  Name: Gloria Grimes MRN: [id number] Date of Birth: 1931/01/12  Transition [facility] CM/SW Contact  Lorraine LILLETTE [place], [place] Phone Number: 12/23/2023, 2:56 PM  Clinical Narrative:    Hardwick from MD Patient is from [facility] independent living but would like to return to their SNF.  MD states she should qualify based on new O2 need, generalized weakness, and poor PT scores.  IPCM to follow for SNF placement at [facility], bed request sent.                      Expected Discharge Plan and Services                                               Social Drivers of Health (SDOH) Interventions SDOH Screenings   Food Insecurity: No Food Insecurity (12/18/2023)  Housing: Low Risk  (12/18/2023)  Transportation Needs: No Transportation Needs (12/18/2023)  Utilities: Not At Risk (12/18/2023)  Financial Resource Strain: Patient Declined (12/20/2022)   Received from [facility]  Social Connections: Moderately Isolated (12/18/2023)  Tobacco Use: Medium Risk (12/17/2023)    Readmission Risk Interventions     No data to display

## 2023-12-23 NOTE — Progress Notes (Signed)
 Physical Therapy Treatment Patient Details Name: Gloria Grimes MRN: [id number] DOB: 27-Mar-1931 Today's Date: 12/23/2023   History of Present Illness Pt is a 89 y/o female admitted secondary to SOB and found to have acute on chronic HFpEF. PMH including but not limited to cervical cancer, CHF with EF greater than 55% based on echo in 2022 done at [facility].    PT Comments  Pt seated at [facility] visiting with family at bedside upon arrival. Pt in no distress and reports feeling better this date. Eager to ambulate. Acute O2 noted, walking oxygen test performed. Pt needs no physical assistance at this time for mobility efforts, encouraged to continue mobility with staff and family. Pt still remains deconditioned. May benefit from respite care at SNF, although will likely not qualify from rehab perspective due to independence with mobility. Recommend PT follow up while admitted and following admission at [facility]. SaO2 on room air at rest = 93% SaO2 on room air while ambulating = 82% SaO2 on 4 liters of O2 while ambulating = 93%     If plan is discharge home, recommend the following: A little help with walking and/or transfers;A little help with bathing/dressing/bathroom;Assistance with cooking/housework;Assist for transportation;Help with stairs or ramp for entrance   Can travel by private vehicle     Yes  Equipment Recommendations  Rolling walker (2 wheels)    Recommendations for Other Services       Precautions / Restrictions Precautions Precautions: Fall Recall of Precautions/Restrictions: Intact Restrictions Weight Bearing Restrictions Per Provider Order: No     Mobility  Bed Mobility Overal bed mobility: Modified Independent             General bed mobility comments: safe technique    Transfers Overall transfer level: Needs assistance Equipment used: Rolling walker (2 wheels) Transfers: Sit to/from Stand Sit to Stand: Contact guard assist            General transfer comment: cues for hand placement. Once standing, upright posture noted    Ambulation/Gait Ambulation/Gait assistance: Modified independent (Device/Increase time) Gait Distance (Feet): 200 Feet Assistive device: Rolling walker (2 wheels) Gait Pattern/deviations: Step-through pattern       General Gait Details: good gait speed with reciprocal gait pattern. Seated rest break between 2 bouts of 100' due to monitoring O2. Slight SOB symptoms noted   Stairs             Wheelchair Mobility     Tilt Bed    Modified Rankin (Stroke Patients Only)       Balance Overall balance assessment: Needs assistance Sitting-balance support: Feet supported Sitting balance-Leahy Scale: Good     Standing balance support: Bilateral upper extremity supported Standing balance-Leahy Scale: Fair                              Hotel manager: Impaired Factors Affecting Communication: Hearing impaired  Cognition Arousal: Alert Behavior During Therapy: WFL for tasks assessed/performed   PT - Cognitive impairments: No apparent impairments                       PT - Cognition Comments: pleasant and agreeable to session Following commands: Intact      Cueing Cueing Techniques: Verbal cues, Tactile cues  Exercises      General Comments        Pertinent Vitals/Pain Pain Assessment Pain Assessment: No/denies pain    Home Living  Prior Function            PT Goals (current goals can now be found in the care plan section) Acute Rehab PT Goals Patient Stated Goal: return home soon PT Goal Formulation: With patient Time For Goal Achievement: 01/02/24 Potential to Achieve Goals: Good Progress towards PT goals: Progressing toward goals    Frequency    Min 2X/week      PT Plan      Co-evaluation              AM-PAC PT 6 Clicks Mobility   Outcome Measure  Help  needed turning from your back to your side while in a flat bed without using bedrails?: None Help needed moving from lying on your back to sitting on the side of a flat bed without using bedrails?: None Help needed moving to and from a bed to a chair (including a wheelchair)?: None Help needed standing up from a chair using your arms (e.g., wheelchair or bedside chair)?: None Help needed to walk in hospital room?: None Help needed climbing 3-5 steps with a railing? : A Little 6 Click Score: 23    End of Session Equipment Utilized During Treatment: Oxygen Activity Tolerance: Patient limited by fatigue Patient left: in bed;with nursing/sitter in room;with family/visitor present Nurse Communication: Mobility status PT Visit Diagnosis: Other abnormalities of gait and mobility (R26.89)     Time: 9141-9077 PT Time Calculation (min) (ACUTE ONLY): 24 min  Charges:    $Gait Training: 23-37 mins PT General Charges $$ ACUTE PT VISIT: 1 Visit                     Corean Dade, PT, DPT, GCS [phone]    Tajon Moring 12/23/2023, 9:39 AM

## 2023-12-23 NOTE — Progress Notes (Signed)
 [facility] CLINIC CARDIOLOGY PROGRESS NOTE       Patient ID: Gloria Grimes MRN: [id number] DOB/AGE: 1930-11-30 89 y.o.  Admit date: 12/17/2023 Referring Physician dr. Drue Potter Primary Physician Valora Agent, MD Primary Cardiologist Dr. Ammon Reason for Consultation CHF exacerbation   HPI: Gloria Grimes is a 89 y.o. female  with a past medical history of chronic HFpEF, hypertension, PVD, cervical cancer, recent cataracts surgery of right eye 1 week ago who presented to the ED on 12/17/2023 for worsening shortness of breath for the past week, and endorses orthopnea.  Patient denies chest pain.  Cardiology was consulted for further evaluation of acute on chronic HFpEF.  Interval History: -Patient seen and examined this AM sitting at an incline in hospital bed with daughter at bedside. Continues to deny chest pain, palpitations or lightheadedness.  -LE edema much improved, wraps still in place. -Patients BP and HR stable this AM. Overnight Tele showed no significant events.  -Remains on supplemental O2, denies SOB this AM.   Review of systems complete and found to be negative unless listed above    Past Medical History:  Diagnosis Date   Cancer (HCC)    ovarian   CHF (congestive heart failure) (HCC)     Past Surgical History:  Procedure Laterality Date   ABDOMINAL HYSTERECTOMY     CATARACT EXTRACTION W/PHACO Right 12/12/2023   Procedure: PHACOEMULSIFICATION, CATARACT, WITH IOL INSERTION 12.79 01:03.7;  Surgeon: Mittie Gaskin, MD;  Location: [facility] ORS;  Service: Ophthalmology;  Laterality: Right;   LOWER EXTREMITY ANGIOGRAPHY Right 12/16/2020   Procedure: LOWER EXTREMITY ANGIOGRAPHY;  Surgeon: Marea Selinda RAMAN, MD;  Location: [facility] INVASIVE CV LAB;  Service: Cardiovascular;  Laterality: Right;    Medications Prior to Admission  Medication Sig Dispense Refill Last Dose/Taking   ascorbic acid  (VITAMIN C ) 500 MG tablet Take 500 mg by mouth daily.   12/16/2023 Evening    carvedilol  (COREG ) 6.25 MG tablet Take 6.25 mg by mouth 2 (two) times daily with a meal.   12/16/2023 Evening   cetirizine (ZYRTEC) 10 MG tablet Take 10 mg by mouth daily.   12/16/2023 Evening   furosemide  (LASIX ) 40 MG tablet Take 40 mg by mouth daily.   12/17/2023 Morning   Lactobacillus-Inulin (CULTURELLE DIGESTIVE HEALTH) CAPS Take by mouth.   Unknown   aspirin EC 81 MG tablet Take 81 mg by mouth daily. Swallow whole. (Patient not taking: Reported on 12/04/2023)      Social History   Socioeconomic History   Marital status: Widowed    Spouse name: Not on file   Number of children: Not on file   Years of education: Not on file   Highest education level: Not on file  Occupational History   Occupation: retired  Tobacco Use   Smoking status: Former   Smokeless tobacco: Never  Substance and Sexual Activity   Alcohol use: No    Comment: wine   Drug use: No   Sexual activity: Never    Birth control/protection: None  Other Topics Concern   Not on file  Social History Narrative   Not on file   Social Drivers of Health   Financial Resource Strain: Patient Declined (12/20/2022)   Received from [facility]   Overall Financial Resource Strain (CARDIA)    Difficulty of Paying Living Expenses: Patient declined  Food Insecurity: No Food Insecurity (12/18/2023)   Hunger Vital Sign    Worried About Running Out of Food in the Last Year: Never true  Ran Out of Food in the Last Year: Never true  Transportation Needs: No Transportation Needs (12/18/2023)   PRAPARE - Administrator, Civil Service (Medical): No    Lack of Transportation (Non-Medical): No  Physical Activity: Not on file  Stress: Not on file  Social Connections: Moderately Isolated (12/18/2023)   Social Connection and Isolation Panel    Frequency of Communication with Friends and Family: More than three times a week    Frequency of Social Gatherings with Friends and Family: Three times a week     Attends Religious Services: 1 to 4 times per year    Active Member of Clubs or Organizations: No    Attends Banker Meetings: Never    Marital Status: Widowed  Intimate Partner Violence: Not At Risk (12/18/2023)   Humiliation, Afraid, Rape, and Kick questionnaire    Fear of Current or Ex-Partner: No    Emotionally Abused: No    Physically Abused: No    Sexually Abused: No    History reviewed. No pertinent family history.   Vitals:   12/23/23 0325 12/23/23 0500 12/23/23 0736 12/23/23 0824  BP: (!) 127/51  (!) 121/49   Pulse: 86  81   Resp: 18  (!) 21   Temp: 98.7 F (37.1 C)  97.7 F (36.5 C)   TempSrc:      SpO2: 97%  95% 97%  Weight:  67.1 kg    Height:        PHYSICAL EXAM General: Well-appearing elderly female, well nourished, in no acute distress. HEENT: Normocephalic and atraumatic. Neck: Elevated JVD.   Lungs: Normal respiratory effort on 2L.  Diminished breath sounds bilaterally Heart: HRRR. Normal S1 and S2 without gallops or murmurs.  Abdomen: Non-distended appearing.  Msk: Normal strength and tone for age. Extremities: Warm and well perfused. No clubbing, cyanosis,  pedal edema around baseline with TED hose in place. Neuro: Alert and oriented X 3. Psych: Answers questions appropriately.   Labs: Basic Metabolic Panel: Recent Labs    12/21/23 0419 12/22/23 0232  NA 143 136  K 3.7 4.1  CL 91* 96*  CO2 44* 33*  GLUCOSE 90 117*  BUN 17 21  CREATININE 0.50 0.53  CALCIUM  9.4 10.6*   Liver Function Tests: Recent Labs    12/20/23 2217  AST 14*  ALT 15  ALKPHOS 42  BILITOT 0.9  PROT 6.0*  ALBUMIN 3.2*   No results for input(s): LIPASE, AMYLASE in the last 72 hours. CBC: No results for input(s): WBC, NEUTROABS, HGB, HCT, MCV, PLT in the last 72 hours.  Cardiac Enzymes: No results for input(s): CKTOTAL, CKMB, CKMBINDEX, TROPONINIHS in the last 72 hours.  BNP: No results for input(s): BNP in the last 72  hours.  D-Dimer: Recent Labs    12/20/23 2217  DDIMER 0.37   Hemoglobin A1C: No results for input(s): HGBA1C in the last 72 hours. Fasting Lipid Panel: No results for input(s): CHOL, HDL, LDLCALC, TRIG, CHOLHDL, LDLDIRECT in the last 72 hours. Thyroid Function Tests: Recent Labs    12/21/23 0419  TSH 1.262   Anemia Panel: No results for input(s): VITAMINB12, FOLATE, FERRITIN, TIBC, IRON, RETICCTPCT in the last 72 hours.   Radiology: CT HEAD WO CONTRAST ( ) Result Date: 12/21/2023 CLINICAL DATA:  Mental status change, unknown cause EXAM: CT HEAD WITHOUT CONTRAST TECHNIQUE: Contiguous axial images were obtained from the base of the skull through the vertex without intravenous contrast. RADIATION DOSE REDUCTION: This exam was performed according  to the departmental dose-optimization program which includes automated exposure control, adjustment of the mA and/or kV according to patient size and/or use of iterative reconstruction technique. COMPARISON:  CT head November 24, 2019. FINDINGS: Brain: No evidence of acute infarction, hemorrhage, hydrocephalus, extra-axial collection or mass lesion/mass effect. Vascular: No hyperdense vessel or unexpected calcification. Skull: Normal. Negative for fracture or focal lesion. Sinuses/Orbits: No acute finding. IMPRESSION: No acute abnormality. Electronically Signed   By: Gilmore GORMAN Molt M.D.   On: 12/21/2023 02:23   CT CHEST WO CONTRAST Result Date: 12/21/2023 EXAM: CT CHEST WITHOUT CONTRAST 12/21/2023 01:48:42 AM TECHNIQUE: CT of the chest was performed without the administration of intravenous contrast. Multiplanar reformatted images are provided for review. Automated exposure control, iterative reconstruction, and/or weight based adjustment of the mA/kV was utilized to reduce the radiation dose to as low as reasonably achievable. COMPARISON: 03/05/2020 CLINICAL HISTORY: Respiratory illness, nondiagnostic xray; acute resp failure  with hypercapnea. FINDINGS: MEDIASTINUM: Heart and pericardium are unremarkable. The central airways are clear. Mild 3-vessel coronary atherosclerosis. Thoracic aortic atherosclerosis. LYMPH NODES: No mediastinal, hilar or axillary lymphadenopathy. LUNGS AND PLEURA: Moderate centrilobular and paraseptal emphysematous changes, upper lung predominant. Trace left pleural effusion with left basilar scarring/atelectasis. Complete right middle lobe collapse, chronic. No focal consolidation or pulmonary edema. No pneumothorax. SOFT TISSUES/BONES: Mild degenerative changes of the visualized thoracolumbar spine. No acute abnormality of the bones or soft tissues. UPPER ABDOMEN: Limited images of the upper abdomen demonstrates no acute abnormality. IMPRESSION: 1. No acute findings. 2. Trace left pleural effusion with left basilar scarring/atelectasis. 3. Complete right middle lobe collapse, chronic. Electronically signed by: Pinkie Pebbles MD 12/21/2023 01:54 AM EDT RP Workstation: [id number]   US  Venous Img Lower Bilateral (DVT) Result Date: 12/20/2023 CLINICAL DATA:  Lower extremity edema. EXAM: BILATERAL LOWER EXTREMITY VENOUS DOPPLER ULTRASOUND TECHNIQUE: Gray-scale sonography with graded compression, as well as color Doppler and duplex ultrasound were performed to evaluate the lower extremity deep venous systems from the level of the common femoral vein and including the common femoral, femoral, profunda femoral, popliteal and calf veins including the posterior tibial, peroneal and gastrocnemius veins when visible. The superficial great saphenous vein was also interrogated. Spectral Doppler was utilized to evaluate flow at rest and with distal augmentation maneuvers in the common femoral, femoral and popliteal veins. COMPARISON:  None Available. FINDINGS: RIGHT LOWER EXTREMITY Common Femoral Vein: No evidence of thrombus. Normal compressibility, respiratory phasicity and response to augmentation. Saphenofemoral  Junction: No evidence of thrombus. Normal compressibility and flow on color Doppler imaging. Profunda Femoral Vein: No evidence of thrombus. Normal compressibility and flow on color Doppler imaging. Femoral Vein: No evidence of thrombus. Normal compressibility, respiratory phasicity and response to augmentation. Popliteal Vein: No evidence of thrombus. Normal compressibility, respiratory phasicity and response to augmentation. Calf Veins: No evidence of thrombus. Normal compressibility and flow on color Doppler imaging. Superficial Great Saphenous Vein: No evidence of thrombus. Normal compressibility. Venous Reflux:  None. Other Findings: No evidence of superficial thrombophlebitis or abnormal fluid collection. LEFT LOWER EXTREMITY Common Femoral Vein: No evidence of thrombus. Normal compressibility, respiratory phasicity and response to augmentation. Saphenofemoral Junction: No evidence of thrombus. Normal compressibility and flow on color Doppler imaging. Profunda Femoral Vein: No evidence of thrombus. Normal compressibility and flow on color Doppler imaging. Femoral Vein: No evidence of thrombus. Normal compressibility, respiratory phasicity and response to augmentation. Popliteal Vein: No evidence of thrombus. Normal compressibility, respiratory phasicity and response to augmentation. Calf Veins: No evidence of thrombus. Normal compressibility and flow  on color Doppler imaging. Superficial Great Saphenous Vein: No evidence of thrombus. Normal compressibility. Venous Reflux:  None. Other Findings: No evidence of superficial thrombophlebitis or abnormal fluid collection. IMPRESSION: No evidence of deep venous thrombosis in either lower extremity. Electronically Signed   By: Marcey Moan M.D.   On: 12/20/2023 16:25   DG Chest Port 1 View Result Date: 12/20/2023 CLINICAL DATA:  Hypoxia.  Congestive heart failure. EXAM: PORTABLE CHEST 1 VIEW COMPARISON:  12/17/2023 FINDINGS: Stable heart size and mediastinal  contours. Increase in peribronchial thickening. There may be small pleural effusions. Atelectasis at the lung bases, more so on the left. No pneumothorax IMPRESSION: 1. Increase in peribronchial thickening, may represent bronchitis or pulmonary edema. 2. Possible small pleural effusions. Bibasilar atelectasis. Electronically Signed   By: Andrea Gasman M.D.   On: 12/20/2023 14:21   ECHOCARDIOGRAM COMPLETE Result Date: 12/18/2023    ECHOCARDIOGRAM REPORT   Patient Name:   Gloria Grimes Date of Exam: 12/18/2023 Medical Rec #:  [id number]      Height:       62.0 in Accession #:    [id number]     Weight:       151.2 lb Date of Birth:  1931-05-21      BSA:          1.698 m Patient Age:    89 years       BP:           138/59 mmHg Patient Gender: F              HR:           93 bpm. Exam Location:  [facility] Procedure: 2D Echo, Cardiac Doppler and Color Doppler (Both Spectral and Color            Flow Doppler were utilized during procedure). Indications:     CHF-acute diastolic I50.31  History:         Patient has no prior history of Echocardiogram examinations.                  CHF. Cancer.  Sonographer:     Christopher Furnace Referring Phys:  [id number] [name] Diagnosing Phys: [name] MD  Sonographer Comments: Suboptimal parasternal window and suboptimal apical window. Best view is subcostal. IMPRESSIONS  1. Left ventricular ejection fraction, by estimation, is 60 to 65%. The left ventricle has normal function. The left ventricle has no regional wall motion abnormalities. Left ventricular diastolic parameters are consistent with Grade I diastolic dysfunction (impaired relaxation).  2. Right ventricular systolic function is normal. The right ventricular size is normal.  3. The mitral valve is normal in structure. Mild mitral valve regurgitation. No evidence of mitral stenosis.  4. The aortic valve is normal in structure. Aortic valve regurgitation is not visualized. No aortic stenosis is present.  5. The inferior  vena cava is normal in size with greater than 50% respiratory variability, suggesting right atrial pressure of 3 mmHg. FINDINGS  Left Ventricle: Left ventricular ejection fraction, by estimation, is 60 to 65%. The left ventricle has normal function. The left ventricle has no regional wall motion abnormalities. Strain was performed and the global longitudinal strain is indeterminate. The left ventricular internal cavity size was normal in size. There is no left ventricular hypertrophy. Left ventricular diastolic parameters are consistent with Grade I diastolic dysfunction (impaired relaxation). Right Ventricle: The right ventricular size is normal. No increase in right ventricular wall thickness. Right ventricular systolic function is  normal. Left Atrium: Left atrial size was normal in size. Right Atrium: Right atrial size was normal in size. Pericardium: There is no evidence of pericardial effusion. Mitral Valve: The mitral valve is normal in structure. Mild mitral valve regurgitation. No evidence of mitral valve stenosis. MV peak gradient, 6.5 mmHg. The mean mitral valve gradient is 3.0 mmHg. Tricuspid Valve: The tricuspid valve is normal in structure. Tricuspid valve regurgitation is mild . No evidence of tricuspid stenosis. Aortic Valve: The aortic valve is normal in structure. Aortic valve regurgitation is not visualized. No aortic stenosis is present. Aortic valve mean gradient measures 3.0 mmHg. Aortic valve peak gradient measures 5.4 mmHg. Aortic valve area, by VTI measures 2.36 cm. Pulmonic Valve: The pulmonic valve was normal in structure. Pulmonic valve regurgitation is not visualized. No evidence of pulmonic stenosis. Aorta: The aortic root is normal in size and structure. Venous: The inferior vena cava is normal in size with greater than 50% respiratory variability, suggesting right atrial pressure of 3 mmHg. IAS/Shunts: No atrial level shunt detected by color flow Doppler. Additional Comments: 3D was  performed not requiring image post processing on an independent workstation and was indeterminate.  LEFT VENTRICLE PLAX 2D LVIDd:         4.30 cm LVIDs:         2.70 cm LV PW:         1.00 cm LV IVS:        1.00 cm LVOT diam:     2.00 cm LV SV:         53 LV SV Index:   31 LVOT Area:     3.14 cm  RIGHT VENTRICLE RV Basal diam:  3.90 cm RV Mid diam:    2.80 cm LEFT ATRIUM             Index        RIGHT ATRIUM           Index LA diam:        3.10 cm 1.83 cm/m   RA Area:     15.90 cm LA Vol (A2C):   32.1 ml 18.91 ml/m  RA Volume:   42.10 ml  24.80 ml/m LA Vol (A4C):   27.1 ml 15.96 ml/m LA Biplane Vol: 31.2 ml 18.38 ml/m  AORTIC VALVE AV Area (Vmax):    2.22 cm AV Area (Vmean):   1.92 cm AV Area (VTI):     2.36 cm AV Vmax:           116.50 cm/s AV Vmean:          85.650 cm/s AV VTI:            0.225 m AV Peak Grad:      5.4 mmHg AV Mean Grad:      3.0 mmHg LVOT Vmax:         82.20 cm/s LVOT Vmean:        52.400 cm/s LVOT VTI:          0.169 m LVOT/AV VTI ratio: 0.75  AORTA Ao Root diam: 2.40 cm MITRAL VALVE                TRICUSPID VALVE MV Area (PHT): 4.36 cm     TR Peak grad:   26.6 mmHg MV Area VTI:   2.34 cm     TR Vmax:        258.00 cm/s MV Peak grad:  6.5 mmHg MV Mean grad:  3.0 mmHg  SHUNTS MV Vmax:       1.27 m/s     Systemic VTI:  0.17 m MV Vmean:      84.8 cm/s    Systemic Diam: 2.00 cm MV Decel Time: 174 msec MV E velocity: 65.00 cm/s MV A velocity: 104.00 cm/s MV E/A ratio:  0.62 [name] MD Electronically signed by [name] MD Signature Date/Time: 12/18/2023/1:26:03 PM    Final    DG Chest 2 View Result Date: 12/17/2023 CLINICAL DATA:  Shortness of breath. EXAM: CHEST - 2 VIEW COMPARISON:  CT chest 03/05/2020 and chest radiograph 11/25/2019. FINDINGS: Trachea is midline. Heart size stable. Thoracic aorta is calcified. Chronic right middle lobe collapse lungs are otherwise clear. Trace bilateral pleural effusions. IMPRESSION: 1. Trace bilateral pleural effusions. 2.  Chronic right middle lobe collapse. Electronically Signed   By: Newell Eke M.D.   On: 12/17/2023 13:25    ECHO as above.  TELEMETRY reviewed by me 12/23/2023: Sinus rhythm rate 80s   EKG reviewed by me: Sinus rhythm, rate 86 bpm without acute ischemic changes.  Data reviewed by me 12/23/2023: last 24h vitals tele labs imaging I/O hospitalist progress notes.  Principal Problem:   CHF (congestive heart failure) (HCC)    ASSESSMENT AND PLAN:   Gloria Grimes is a 89 y.o. female  with a past medical history of chronic HFpEF, hypertension, PVD, cervical cancer, recent cataracts surgery of right eye 1 week ago who presented to the ED on 12/17/2023 for worsening shortness of breath for the past week, and endorses orthopnea.  Patient denies chest pain.  Cardiology was consulted for further evaluation of acute on chronic HFpEF.  # Acute on chronic HFpEF # Hypertension Patient with worsening SOB. NP elevated at 450.  CXR with trace bilateral pleural effusions, pulmonary vascular congestion.  Echo this admission with preserved EF of 60-65%, no RWMA, grade 1 diastolic dysfunction, mild MR.  Of note patient states she has lot of medication allergies and does not like starting new meds due to this.  Patient has been refusing losartan  and spironolactone , had long discussion regarding these heart failure related medications and patient is now agreeable. -Start PO lasix  20 mg daily today. -Continue losartan  12.5 mg daily. Will uptitrate as BP allows -Continue metoprolol  succinate 25 mg daily. -Continues spironolactone  12.5 mg daily.  Will uptitrate as BP allows -Would like to initiate Farxiga, however co-pay is high at $150.  Patient prefers we do not initiate. -Recommend patient ambulate today.  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  Cardiology will sign off. Please haiku with questions or re-engage if needed.  Follow up  appointment scheduled with HF clinic on 7/30.   This patient's plan of care was discussed and created with Dr. Florencio and he is in agreement.  Signed: Danita Bloch, PA-C  12/23/2023, 8:30 AM [facility]

## 2023-12-23 NOTE — Progress Notes (Signed)
 Mobility Specialist - Progress Note     12/23/23 1431  Mobility  Activity Ambulated with assistance in hallway;Stood at bedside  Level of Assistance Standby assist, set-up cues, supervision of patient - no hands on  Assistive Device Front wheel walker  Distance Ambulated (ft) 140 ft  Range of Motion/Exercises Active  Activity Response Tolerated well  Mobility Referral Yes  Mobility visit 1 Mobility  Mobility Specialist Start Time (ACUTE ONLY) 1414  Mobility Specialist Stop Time (ACUTE ONLY) 1431  Mobility Specialist Time Calculation (min) (ACUTE ONLY) 17 min    [employer] Mobility Specialist 12/23/23, 2:34 PM

## 2023-12-23 NOTE — Evaluation (Signed)
 Occupational Therapy Evaluation Patient Details Name: Gloria Grimes MRN: [id number] DOB: 08-18-1930 Today's Date: 12/23/2023   History of Present Illness   Pt is a 89 y/o female admitted secondary to SOB and found to have acute on chronic HFpEF. PMH including but not limited to cervical cancer, CHF with EF greater than 55% based on echo in 2022 done at [facility].     Clinical Impressions Chart reviewed to date, pt greeted in bed, oriented x4, agreeable to OT evaluation. PTA pt is MOD I-I in ADL/IADL, drives but does eat her meals at IL[facility] where she lives. She reports she uses her rollator when she needs it- but generally amb around her apartment with no AD. Pt presents with deficits in strength, endurance, activity tolerance, balance, affecting safe and optimal Adl completion. She does require CGA for amb with no AD and for toilet transfers, MIN A for LB dressing. She has unna boots on and a new oxygen requirement. Educated patient regarding options for discharge as she appears to be performing below PLOF. OT will follow acutely to facilitate optimal ADL/functional mobility performance and return to PLOF. Pt is left in chair, all needs met.      If plan is discharge home, recommend the following:   A little help with walking and/or transfers;Two people to help with bathing/dressing/bathroom;Help with stairs or ramp for entrance;Assist for transportation     Functional Status Assessment   Patient has had a recent decline in their functional status and demonstrates the ability to make significant improvements in function in a reasonable and predictable amount of time.     Equipment Recommendations   BSC/3in1     Recommendations for Other Services         Precautions/Restrictions   Precautions Precautions: Fall Recall of Precautions/Restrictions: Intact Restrictions Weight Bearing Restrictions Per Provider Order: No     Mobility Bed Mobility Overal bed  mobility: Modified Independent                  Transfers Overall transfer level: Needs assistance Equipment used: Rolling walker (2 wheels) Transfers: Sit to/from Stand Sit to Stand: Contact guard assist, Min assist                  Balance Overall balance assessment: Needs assistance Sitting-balance support: Feet supported Sitting balance-Leahy Scale: Good     Standing balance support: Bilateral upper extremity supported, During functional activity, Reliant on assistive device for balance Standing balance-Leahy Scale: Fair                             ADL either performed or assessed with clinical judgement   ADL Overall ADL's : Needs assistance/impaired Eating/Feeding: Set up;Sitting   Grooming: Contact guard assist Grooming Details (indicate cue type and reason): no AD standing at sink level             Lower Body Dressing: Minimal assistance;Sit to/from stand Lower Body Dressing Details (indicate cue type and reason): donn/doff underwear Toilet Transfer: Contact guard assist;Minimal assistance Toilet Transfer Details (indicate cue type and reason): CGA with RW, CGA-MIN A with no AD Toileting- Clothing Manipulation and Hygiene: Supervision/safety;Sitting/lateral lean Toileting - Clothing Manipulation Details (indicate cue type and reason): urinate on toilet     Functional mobility during ADLs: Supervision/safety;Contact guard assist (approx 20' with no AD with CGA; 10' with RW with supervision)       Vision Patient Visual Report: No change  from baseline Additional Comments: recently had cataracts surgery     Perception         Praxis         Pertinent Vitals/Pain Pain Assessment Pain Assessment: No/denies pain     Extremity/Trunk Assessment Upper Extremity Assessment Upper Extremity Assessment: Generalized weakness   Lower Extremity Assessment Lower Extremity Assessment: Generalized weakness       Communication  Communication Communication: Impaired Factors Affecting Communication: Hearing impaired   Cognition Arousal: Alert Behavior During Therapy: WFL for tasks assessed/performed Cognition: No apparent impairments                               Following commands: Intact       Cueing  General Comments   Cueing Techniques: Verbal cues;Tactile cues  spo2 >90% on 2L via [place] throughout   Exercises Other Exercises Other Exercises: edu re: role of OT, role of rehab, safe ADL completion with AE/DME   Shoulder Instructions      Home Living Family/patient expects to be discharged to:: Private residence (ILF) Living Arrangements: Alone Available Help at Discharge: Family;Neighbor;Available PRN/intermittently Type of Home: Independent living facility Home Access: Level entry     Home Layout: One level     Bathroom Shower/Tub: Producer, television/film/video: Handicapped height     Home Equipment: Rollator (4 wheels)          Prior Functioning/Environment Prior Level of Function : Independent/Modified Independent;Driving             Mobility Comments: PRN amb with rollator- reports she does not use device in her apt ADLs Comments: MOD I-I with ADL/IADL; gets meals from ILF    OT Problem List: Decreased strength;Decreased activity tolerance;Decreased knowledge of use of DME or AE;Impaired balance (sitting and/or standing);Decreased safety awareness;Cardiopulmonary status limiting activity   OT Treatment/Interventions: Self-care/ADL training;Therapeutic exercise;Patient/family education;Balance training;Energy conservation;Therapeutic activities;DME and/or AE instruction;Cognitive remediation/compensation      OT Goals(Current goals can be found in the care plan section)   Acute Rehab OT Goals Patient Stated Goal: improve function OT Goal Formulation: With patient Time For Goal Achievement: 01/06/24 Potential to Achieve Goals: Good ADL Goals Pt Will  Perform Grooming: with modified independence;sitting;standing Pt Will Perform Lower Body Dressing: with modified independence;sitting/lateral leans;sit to/from stand Pt Will Transfer to Toilet: with modified independence;ambulating Pt Will Perform Toileting - Clothing Manipulation and hygiene: with modified independence;sitting/lateral leans;sit to/from stand   OT Frequency:  Min 2X/week    Co-evaluation              AM-PAC OT 6 Clicks Daily Activity     Outcome Measure Help from another person eating meals?: None Help from another person taking care of personal grooming?: None Help from another person toileting, which includes using toliet, bedpan, or urinal?: A Little Help from another person bathing (including washing, rinsing, drying)?: A Little Help from another person to put on and taking off regular upper body clothing?: None Help from another person to put on and taking off regular lower body clothing?: A Little 6 Click Score: 21   End of Session Equipment Utilized During Treatment: Rolling walker (2 wheels);Oxygen  Activity Tolerance: Patient tolerated treatment well Patient left: in chair;with call bell/phone within reach;with chair alarm set  OT Visit Diagnosis: Other abnormalities of gait and mobility (R26.89)                Time: 8884-8864 OT Time Calculation (  min): 20 min Charges:  OT General Charges $OT Visit: 1 Visit OT Evaluation $OT Eval Moderate Complexity: 1 Mod Therisa Sheffield, OTD OTR/L  12/23/23, 12:55 PM

## 2023-12-23 NOTE — Progress Notes (Addendum)
 Mobility Specialist - Progress Note   Pre-mobility: HR, BP, SpO2 During mobility: HR, BP, SpO2 Post-mobility: HR, BP, SPO2     12/23/23 1259  Mobility  Activity Ambulated with assistance in hallway  Level of Assistance Standby assist, set-up cues, supervision of patient - no hands on  Assistive Device Front wheel walker  Distance Ambulated (ft) 140 ft  Range of Motion/Exercises Active  Activity Response Tolerated well  Mobility Referral Yes  Mobility visit 1 Mobility  Mobility Specialist Start Time (ACUTE ONLY) 1259  Mobility Specialist Stop Time (ACUTE ONLY) 1314  Mobility Specialist Time Calculation (min) (ACUTE ONLY) 15 min   Pt resting in recliner on 2L upon entry. (Pt pushed to 3L during ambulation) Pt STS and ambulates to hallway around NS SBA with RW. Pt endorses no sob or distress during session. Pt returned to recliner and left with needs in reach. Chair alarm activated.   Gloria Grimes Mobility Specialist 12/23/23, 2:33 PM

## 2023-12-24 NOTE — Progress Notes (Addendum)
 During bedside report patient requested leg wraps be removed due to pain. They were placed and dated 7/24. She is willing to have TED hose be applied and states she wears TED hose at home.

## 2023-12-24 NOTE — NC FL2 (Signed)
 [place]  MEDICAID FL2 LEVEL OF CARE FORM     IDENTIFICATION  Patient Name: Gloria Grimes Birthdate: [birth year] Sex: female Admission Date (Current Location): 12/17/2023  [facility] [place] Number:  Chiropodist and Address:  [facility], [address], [place], [place] [address]      Provider Number: [id number]  Attending Physician Name and Address:  Leesa Kast, DO  Relative Name and Phone Number:  Luke Euler    Current Level of Care: Hospital Recommended Level of Care: Skilled Nursing Facility Prior Approval Number:    Date Approved/Denied:   PASRR Number: [id number]  Discharge Plan: SNF    Current Diagnoses: Patient Active Problem List   Diagnosis Date Noted   CHF (congestive heart failure) (HCC) 12/17/2023   Atherosclerosis of native arteries of extremity with rest pain (HCC) 12/14/2020   Lung nodule 08/30/2020   Sepsis (HCC) 03/08/2016   Mitral valve disease 02/18/2014   Essential hypertension 02/10/2014   Right shoulder pain 12/30/2013    Orientation RESPIRATION BLADDER Height & Weight     Self, Time, Situation, Place  O2 (Nasual Cannula 2 L) Continent Weight: 151 lb 0.2 oz (68.5 kg) Height:  5' 2 (157.5 cm)  BEHAVIORAL SYMPTOMS/MOOD NEUROLOGICAL BOWEL NUTRITION STATUS      Continent Diet (2 Gram Sodium)  AMBULATORY STATUS COMMUNICATION OF NEEDS Skin   Extensive Assist Verbally Surgical wounds (Surgical closed incision, Right eye)                       Personal Care Assistance Level of Assistance  Bathing, Dressing, Feeding Bathing Assistance: Limited assistance Feeding assistance: Independent Dressing Assistance: Limited assistance     Functional Limitations Info  Hearing, Sight Sight Info: Impaired Hearing Info: Impaired      SPECIAL CARE FACTORS FREQUENCY  PT (By licensed PT), OT (By licensed OT)     PT Frequency: 5x/week OT Frequency: 5x/week            Contractures       Additional Factors Info  Code Status, Allergies Code Status Info: DNR Limited Allergies Info: Statins, Azithromycin , Clindamycin Hcl, Demeclocycline, Erythromycin Ethylsuccinate, Penicillins, Shellfish Allergy, Triamcinolone Acetonide           Current Medications (12/24/2023):  This is the current hospital active medication list Current Facility-Administered Medications  Medication Dose Route Frequency Provider Last Rate Last Admin   dextromethorphan -guaiFENesin  (MUCINEX  DM) 30-600 MG per 12 hr tablet 1 tablet  1 tablet Oral BID PRN Dezii, Alexandra, DO       enoxaparin  (LOVENOX ) injection 40 mg  40 mg Subcutaneous Q24H Djan, Prince T, MD   40 mg at 12/23/23 2103   furosemide  (LASIX ) tablet 20 mg  20 mg Oral Daily Dezii, Alexandra, DO   20 mg at 12/24/23 0845   ipratropium-albuterol  (DUONEB) 0.5-2.5 (3) MG/3ML nebulizer solution 3 mL  3 mL Nebulization Q4H PRN Dorinda Drue DASEN, MD       ipratropium-albuterol  (DUONEB) 0.5-2.5 (3) MG/3ML nebulizer solution 3 mL  3 mL Nebulization TID Parris Manna, MD   3 mL at 12/24/23 0757   lactulose  (CHRONULAC ) 10 GM/15ML solution 20 g  20 g Oral TID Dezii, Alexandra, DO   20 g at 12/24/23 0845   levofloxacin  (LEVAQUIN ) tablet 250 mg  250 mg Oral Daily Dezii, Alexandra, DO   250 mg at 12/24/23 9152   loratadine  (CLARITIN ) tablet 10 mg  10 mg Oral Daily Mansy, Madison LABOR, MD   10 mg  at 12/23/23 2103   losartan  (COZAAR ) tablet 12.5 mg  12.5 mg Oral Daily Decoste, Gabriella, PA-C   12.5 mg at 12/24/23 0845   metoprolol  succinate (TOPROL -XL) 24 hr tablet 25 mg  25 mg Oral Daily Decoste, Gabriella, PA-C   25 mg at 12/24/23 0844   naphazoline-glycerin  (CLEAR EYES REDNESS) ophth solution 1-2 drop  1-2 drop Both Eyes QID PRN Dezii, Lorane, DO   1 drop at 12/23/23 0935   Prednisolon-Moxiflox-Bromfenac  1-0.5-0.075 % SOLN 1 drop  1 drop Right Eye BID Merrill, Kristin A, RPH   1 drop at 12/24/23 0846   predniSONE  (DELTASONE ) tablet 20 mg  20 mg Oral Q breakfast  Dezii, Alexandra, DO   20 mg at 12/24/23 0844   spironolactone  (ALDACTONE ) tablet 25 mg  25 mg Oral Daily Decoste, Gabriella, PA-C   25 mg at 12/24/23 9155     Discharge Medications: Please see discharge summary for a list of discharge medications.  Relevant Imaging Results:  Relevant Lab Results:   Additional Information SSN: [id number]  Labrisha Wuellner  [place], LCSW

## 2023-12-24 NOTE — Progress Notes (Signed)
 Occupational Therapy Treatment Patient Details Name: Gloria Grimes MRN: [id number] DOB: January 30, 1931 Today's Date: 12/24/2023   History of present illness Pt is a 89 y/o female admitted secondary to SOB and found to have acute on chronic HFpEF. PMH including but not limited to cervical cancer, CHF with EF greater than 55% based on echo in 2022 done at [facility].   OT comments  Pt seen for OT tx this date. Discussed discharge recommendations with pt and son. Pt presents below level of functional baseline as she fatigues quickly with short bouts of mobility and lives alone. She has to walk a long distance (500 ft+) to dining room for meals at ILF. Pt completes bed mobility with supervision, CGA + RW t/f bathroom for continent void, supervision for clothing management and hygiene, and walks 2 laps on small unit before requiring rest break. SpO2 on RA down to 91% with mobility, SpO2 95%> on 2L/min. Discharge recommendation remains appropriate, patient will benefit from continued inpatient follow up therapy, <3 hours/day.       If plan is discharge home, recommend the following:  A little help with walking and/or transfers;Two people to help with bathing/dressing/bathroom;Help with stairs or ramp for entrance;Assist for transportation   Equipment Recommendations  BSC/3in1       Precautions / Restrictions Precautions Precautions: Fall Restrictions Weight Bearing Restrictions Per Provider Order: No       Mobility Bed Mobility Overal bed mobility: Modified Independent                  Transfers Overall transfer level: Needs assistance Equipment used: Rolling walker (2 wheels) Transfers: Sit to/from Stand Sit to Stand: Contact guard assist           General transfer comment: cues for hand placement. Once standing, upright posture noted     Balance Overall balance assessment: Needs assistance Sitting-balance support: Feet supported Sitting balance-Leahy Scale:  Good Sitting balance - Comments: sitting EOB   Standing balance support: Bilateral upper extremity supported, During functional activity, Reliant on assistive device for balance Standing balance-Leahy Scale: Fair Standing balance comment: use of RW                           ADL either performed or assessed with clinical judgement   ADL Overall ADL's : Needs assistance/impaired     Grooming: Contact guard assist;Standing;Wash/dry hands Grooming Details (indicate cue type and reason): sink level             Lower Body Dressing: Contact guard assist Lower Body Dressing Details (indicate cue type and reason): donn/doff underwear Toilet Transfer: Contact guard assist;Standard walker;Ambulation   Toileting- Clothing Manipulation and Hygiene: Supervision/safety;Sit to/from stand Toileting - Clothing Manipulation Details (indicate cue type and reason): continent void on toilet     Functional mobility during ADLs: Contact guard assist;Rolling walker (2 wheels) (2 laps on small B pod with RW) General ADL Comments: Pt reports fatgiue after session, not at baseline     Communication Communication Communication: Impaired Factors Affecting Communication: Hearing impaired   Cognition Arousal: Alert Behavior During Therapy: WFL for tasks assessed/performed Cognition: No apparent impairments                               Following commands: Intact        Cueing   Cueing Techniques: Verbal cues, Tactile cues  General Comments Pt on 2L/min start of session. SpO2 95%> on 2L, on RA with mobility 91%, HR WNL    Pertinent Vitals/ Pain       Pain Assessment Pain Assessment: No/denies pain   Frequency  Min 2X/week        Progress Toward Goals  OT Goals(current goals can now be found in the care plan section)  Progress towards OT goals: Progressing toward goals  Acute Rehab OT Goals OT Goal Formulation: With patient Time For Goal  Achievement: 01/06/24 Potential to Achieve Goals: Good  Plan         AM-PAC OT 6 Clicks Daily Activity     Outcome Measure   Help from another person eating meals?: None Help from another person taking care of personal grooming?: None Help from another person toileting, which includes using toliet, bedpan, or urinal?: A Little Help from another person bathing (including washing, rinsing, drying)?: A Little Help from another person to put on and taking off regular upper body clothing?: None Help from another person to put on and taking off regular lower body clothing?: A Little 6 Click Score: 21    End of Session Equipment Utilized During Treatment: Rolling walker (2 wheels);Oxygen  OT Visit Diagnosis: Other abnormalities of gait and mobility (R26.89)   Activity Tolerance Patient tolerated treatment well   Patient Left in bed;with call bell/phone within reach;with bed alarm set;with family/visitor present   Nurse Communication Mobility status        Time: 9062-8997 OT Time Calculation (min): 25 min  Charges: OT General Charges $OT Visit: 1 Visit OT Treatments $Self Care/Home Management : 23-37 mins  Kahle Mcqueen L. Vinton Layson, OTR/L  12/24/23, 1:16 PM

## 2023-12-24 NOTE — TOC Progression Note (Addendum)
 Transition of Care [facility]) - Progression Note    Patient Details  Name: Gloria Grimes MRN: [id number] Date of Birth: 04-18-1931  [facility] CM/SW Contact  Alvaro Louder, [place] Phone Number: 12/24/2023, 12:11 PM  Clinical Narrative:   LCSWA met with patient and son at bedside to discuss SNF. The patient indicated that she wants to return to [facility] and participate in SNF until she is stable to return back to independent living. LCSWA faxed out information to facility. Awaiting response.   Update 2:36 PM: LCSWA started insurance authorization, SNF Auth pending: [id number]   TOC to follow for discharge                     Expected Discharge Plan and Services                                               Social Drivers of Health (SDOH) Interventions SDOH Screenings   Food Insecurity: No Food Insecurity (12/18/2023)  Housing: Low Risk  (12/18/2023)  Transportation Needs: No Transportation Needs (12/18/2023)  Utilities: Not At Risk (12/18/2023)  Financial Resource Strain: Patient Declined (12/20/2022)   Received from [facility]  Social Connections: Moderately Isolated (12/18/2023)  Tobacco Use: Medium Risk (12/17/2023)    Readmission Risk Interventions     No data to display

## 2023-12-24 NOTE — Progress Notes (Signed)
 Physical Therapy Treatment Patient Details Name: Gloria Grimes MRN: [id number] DOB: May 11, 1931 Today's Date: 12/24/2023   History of Present Illness Pt is a 89 y/o female admitted secondary to SOB and found to have acute on chronic HFpEF. PMH including but not limited to cervical cancer, CHF with EF greater than 55% based on echo in 2022 done at [facility].    PT Comments  Pt in bathroom with tech upon arrival.  Assisted with finishing bath then walks x 3 laps on small B pod.  Uses RW well.  Continues to feel she is not at baseline to return to independent living.  Pt on 2 lpm for session.     If plan is discharge home, recommend the following: A little help with walking and/or transfers;A little help with bathing/dressing/bathroom;Assistance with cooking/housework;Assist for transportation;Help with stairs or ramp for entrance   Can travel by private vehicle     Yes  Equipment Recommendations  Rolling walker (2 wheels)    Recommendations for Other Services       Precautions / Restrictions Precautions Precautions: Fall Recall of Precautions/Restrictions: Intact Restrictions Weight Bearing Restrictions Per Provider Order: No     Mobility  Bed Mobility Overal bed mobility: Modified Independent               Patient Response: Cooperative  Transfers Overall transfer level: Needs assistance Equipment used: Rolling walker (2 wheels) Transfers: Sit to/from Stand Sit to Stand: Contact guard assist, Min assist                Ambulation/Gait Ambulation/Gait assistance: Modified independent (Device/Increase time) Gait Distance (Feet): 300 Feet Assistive device: Rolling walker (2 wheels) Gait Pattern/deviations: Step-through pattern Gait velocity: decreased     General Gait Details: progressing well with mobility   Stairs             Wheelchair Mobility     Tilt Bed Tilt Bed Patient Response: Cooperative  Modified Rankin (Stroke  Patients Only)       Balance Overall balance assessment: Needs assistance Sitting-balance support: Feet supported Sitting balance-Leahy Scale: Good     Standing balance support: Bilateral upper extremity supported, During functional activity, Reliant on assistive device for balance Standing balance-Leahy Scale: Fair                              Hotel manager: Impaired Factors Affecting Communication: Hearing impaired  Cognition Arousal: Alert Behavior During Therapy: WFL for tasks assessed/performed   PT - Cognitive impairments: No apparent impairments                         Following commands: Intact      Cueing Cueing Techniques: Verbal cues, Tactile cues  Exercises      General Comments        Pertinent Vitals/Pain Pain Assessment Pain Assessment: No/denies pain    Home Living                          Prior Function            PT Goals (current goals can now be found in the care plan section) Progress towards PT goals: Progressing toward goals    Frequency    Min 2X/week      PT Plan      Co-evaluation  AM-PAC PT 6 Clicks Mobility   Outcome Measure  Help needed turning from your back to your side while in a flat bed without using bedrails?: None Help needed moving from lying on your back to sitting on the side of a flat bed without using bedrails?: None Help needed moving to and from a bed to a chair (including a wheelchair)?: None Help needed standing up from a chair using your arms (e.g., wheelchair or bedside chair)?: None Help needed to walk in hospital room?: A Little Help needed climbing 3-5 steps with a railing? : A Little 6 Click Score: 22    End of Session Equipment Utilized During Treatment: Oxygen;Gait belt Activity Tolerance: Patient limited by fatigue Patient left: in bed;with family/visitor present;with call bell/phone within reach;with bed alarm  set Nurse Communication: Mobility status PT Visit Diagnosis: Other abnormalities of gait and mobility (R26.89)     Time: 8870-8857 PT Time Calculation (min) (ACUTE ONLY): 13 min  Charges:    $Gait Training: 8-22 mins PT General Charges $$ ACUTE PT VISIT: 1 Visit                     Lauraine Gills, PTA 12/24/23, 12:18 PM

## 2023-12-24 NOTE — Plan of Care (Signed)
  Problem: Health Behavior/Discharge Planning: Goal: Ability to manage health-related needs will improve Outcome: Progressing   Problem: Clinical Measurements: Goal: Will remain free from infection Outcome: Progressing   Problem: Activity: Goal: Risk for activity intolerance will decrease Outcome: Progressing   Problem: Safety: Goal: Ability to remain free from injury will improve Outcome: Progressing   

## 2023-12-24 NOTE — Progress Notes (Signed)
 PROGRESS NOTE    Gloria Grimes  FMW:[id number] DOB: Jun 02, 1930 DOA: 12/17/2023 PCP: Valora Agent, MD  Chief Complaint  Patient presents with   Shortness of Breath    Hospital Course:  Gloria Grimes is a 89 year old female with history of cervical cancer, CHF with preserved EF, recent cataract surgery involving the right eye, who presented with progressive shortness of breath and PND.  On arrival patient was found to be hypoxic to 80%, BNP 453, CXR with vascular congestion and chronic right middle lung collapse.  Patient was admitted for acute on chronic diastolic heart failure exacerbation  Subjective: No acute events.  Patient is very clinically stable.  She is awaiting skilled nursing facility arrangements.  Her son is at bedside.  All questions and concerns answered to the best of my ability  Objective: Vitals:   12/24/23 0808 12/24/23 1335 12/24/23 1521 12/24/23 1555  BP: 121/61  (!) 115/42 (!) 120/54  Pulse: 86  83 80  Resp: 16  18 16   Temp: 97.6 F (36.4 C)  98.6 F (37 C) 98.6 F (37 C)  TempSrc:    Oral  SpO2: 99% 98% 95% 96%  Weight:      Height:        Intake/Output Summary (Last 24 hours) at 12/24/2023 1741 Last data filed at 12/24/2023 0900 Gross per 24 hour  Intake 480 ml  Output --  Net 480 ml   Filed Weights   12/22/23 0500 12/23/23 0500 12/23/23 2343  Weight: 66.6 kg 67.1 kg 68.5 kg    Examination: General exam: Appears calm and comfortable, NAD  Respiratory system: No work of breathing, symmetric chest wall expansion, no significant wheezing.  On 2 L without evidence of respiratory distress. Cardiovascular system: S1 & S2 heard, RRR.  Gastrointestinal system: Abdomen is nondistended, soft and nontender.  Neuro: Alert and oriented. No focal neurological deficits. Extremities: No pitting edema, no erythema skin: No rashes, lesions Psychiatry: Demonstrates appropriate judgement and insight. Mood & affect appropriate for situation.   Assessment &  Plan:  Principal Problem:   CHF (congestive heart failure) (HCC)    Hypercapnic and hypoxic respiratory failure Chronic right middle lung collapse - Hypercapnia likely multifactorial, perhaps some component of contraction alkalosis, complicated by chronic CO2 retention, suspected underlying OSA, chronic lung disease, +/- COPD flare - Much improved now.  On 2 L nasal cannula.  Continues to improve - Patient follows outpatient with pulmonology Dr. Aleskerov, he was consulted during this admission - On steroids, MetaNebs, Levaquin  x 4 days. - Continue with steroid taper - Continue to wean oxygen as tolerated - Continue I-S and FV  - Has required home oxygen in the past though she has been stable on room air as recently as 2 weeks ago.  Would benefit from brief SNF stay given her recent deconditioning.  Acute on chronic diastolic CHF - BNP elevated to 453 on arrival - Echo this admission: LVEF 60 to 65%, no RWMA, grade 1 diastolic dysfunction.  No significant valvular disease noted. - Continue with low-dose Lasix . - Continue with losartan , though in setting of preserved EF can consider discontinuation to allow for greater diuresis as needed  Lower extremity swelling - This is chronic.  In some part exacerbated by acute CHF exacerbation.  Patient does have stents in the right leg and is followed by [facility]. - Dopplers negative for DVT - Per family patient report this is the best the patient's legs have looked in years. - TED hose as patient  can tolerate  Tobacco abuse - Quit smoking 40 years prior. - Emphysematous changes on CT though patient does not carry a diagnosis of COPD  Hypertension - Resume meds and titrate as needed  Generalized weakness - Physical therapy currently recommending SNF.  TOC has been consulted to assist in these arrangements - Patient is very deconditioned compared to her baseline.  She was previously independent and easily ambulatory.  She is improving now and  has been working well with physical therapy.  She is using a walker but remains deconditioned and requires 24/7 standby assistance.  She would do well with short 2-week SNF stay to return to her previous level of functioning and independence  DVT prophylaxis: Lovenox    Code Status: Limited: Do not attempt resuscitation (DNR) -DNR-LIMITED -Do Not Intubate/DNI  Disposition: Medically ready for discharge.  Pending SNF placement.  TOC aware  Consultants:  Treatment Team:  Consulting Physician: Ammon Blunt, MD Consulting Physician: Parris Manna, MD  Procedures:    Antimicrobials:  Anti-infectives (From admission, onward)    Start     Dose/Rate Route Frequency Ordered Stop   12/22/23 1000  levofloxacin  (LEVAQUIN ) tablet 250 mg       Placed in Followed by Linked Group   250 mg Oral Daily 12/21/23 1417 12/26/23 0959   12/21/23 1600  levofloxacin  (LEVAQUIN ) IVPB 500 mg       Placed in Followed by Linked Group   500 mg 100 mL/hr over 60 Minutes Intravenous  Once 12/21/23 1417 12/21/23 1818   12/21/23 1445  levofloxacin  (LEVAQUIN ) IVPB 750 mg  Status:  Discontinued        750 mg 100 mL/hr over 90 Minutes Intravenous Every 24 hours 12/21/23 1346 12/21/23 1417       Data Reviewed: I have personally reviewed following labs and imaging studies CBC: Recent Labs  Lab 12/18/23 0931 12/19/23 0433 12/20/23 0312  WBC 8.2 8.2 7.1  NEUTROABS 6.5  --   --   HGB 15.5* 14.8 14.1  HCT 48.7* 48.3* 46.0  MCV 91.4 93.6 93.5  PLT 232 203 202   Basic Metabolic Panel: Recent Labs  Lab 12/20/23 0312 12/20/23 2217 12/21/23 0419 12/22/23 0232 12/23/23 0839  NA 142 142 143 136 139  K 4.2 3.9 3.7 4.1 4.0  CL 93* 89* 91* 96* 98  CO2 38* 44* 44* 33* 34*  GLUCOSE 114* 149* 90 117* 88  BUN 17 18 17 21  25*  CREATININE 0.53 0.53 0.50 0.53 0.54  CALCIUM  9.4 9.5 9.4 10.6* 10.4*   GFR: Estimated Creatinine Clearance: 41.6 mL/min (by C-G formula based on SCr of 0.54 mg/dL). Liver  Function Tests: Recent Labs  Lab 12/20/23 2217 12/23/23 0839  AST 14* 13*  ALT 15 12  ALKPHOS 42 36*  BILITOT 0.9 0.6  PROT 6.0* 5.6*  ALBUMIN 3.2* 3.0*   CBG: Recent Labs  Lab 12/20/23 2158  GLUCAP 147*    No results found for this or any previous visit (from the past 240 hours).   Radiology Studies: No results found.   Scheduled Meds:  enoxaparin  (LOVENOX ) injection  40 mg Subcutaneous Q24H   furosemide   20 mg Oral Daily   ipratropium-albuterol   3 mL Nebulization TID   lactulose   20 g Oral TID   levofloxacin   250 mg Oral Daily   loratadine   10 mg Oral Daily   losartan   12.5 mg Oral Daily   metoprolol  succinate  25 mg Oral Daily   Prednisolon-Moxiflox-Bromfenac   1 drop Right Eye BID  predniSONE   20 mg Oral Q breakfast   spironolactone   25 mg Oral Daily   Continuous Infusions:     LOS: 7 days  MDM: Patient is high risk for one or more organ failure.  They necessitate ongoing hospitalization for continued IV therapies and subsequent lab monitoring. Total time spent interpreting labs and vitals, reviewing the medical record, coordinating care amongst consultants and care team members, directly assessing and discussing care with the patient and/or family: 55 min  Jeramine Delis, DO [facility]  To contact the attending physician between 7A-7P please use Epic Chat. To contact the covering physician during after hours 7P-7A, please review Amion.  12/24/2023, 5:41 PM   *This document has been created with the assistance of dictation software. Please excuse typographical errors. *

## 2023-12-24 NOTE — Progress Notes (Signed)
 PULMONOLOGY         Date: 12/24/2023,   MRN# [id number] Gloria Grimes [birth year]     AdmissionWeight: 70.3 kg                 CurrentWeight: 68.5 kg  Referring provider: Dr Lillette   CHIEF COMPLAINT:   Complete right lung collapse    HISTORY OF PRESENT ILLNESS   Gloria Grimes is a 89 y.o. female with medical history significant of cervical cancer,diastolic CHF, cataracts came in for worsening SOB.  Denies cough, abdominal pain, nausea vomiting, chest pain, or urinary complaints Upon arrival to the emergency room patient was found to have saturation of 80%, this improved with 2 L of intranasal oxygen to the low 90s.  BNP elevated to 453.  Chest x-ray showing findings of vascular congestion.  As well as chronic right middle lung lobe collapse.  pCCM consultation for further evaluation and management.    12/24/23- patient seen at bedside. Son is present during interview and examination.  Patient on 2L/min [place] in NAD and is reporting improvement in respiratory status close to baseline. She is at [facility] and is in process of going back there upon discharge for rehab.  She [facility] hospital dc.   PAST MEDICAL HISTORY   Past Medical History:  Diagnosis Date  . Cancer (HCC)    ovarian  . CHF (congestive heart failure) (HCC)      SURGICAL HISTORY   Past Surgical History:  Procedure Laterality Date  . ABDOMINAL HYSTERECTOMY    . CATARACT EXTRACTION W/PHACO Right 12/12/2023   Procedure: PHACOEMULSIFICATION, CATARACT, WITH IOL INSERTION 12.79 01:03.7;  Surgeon: Mittie Gaskin, MD;  Location: [facility] ORS;  Service: Ophthalmology;  Laterality: Right;  . LOWER EXTREMITY ANGIOGRAPHY Right 12/16/2020   Procedure: LOWER EXTREMITY ANGIOGRAPHY;  Surgeon: Marea Selinda RAMAN, MD;  Location: [facility] INVASIVE CV LAB;  Service: Cardiovascular;  Laterality: Right;     FAMILY HISTORY   History reviewed. No pertinent family  history.   SOCIAL HISTORY   Social History   Tobacco Use  . Smoking status: Former  . Smokeless tobacco: Never  Substance Use Topics  . Alcohol use: No    Comment: wine  . Drug use: No     MEDICATIONS    Home Medication:    Current Medication:  Current Facility-Administered Medications:  .  dextromethorphan -guaiFENesin  (MUCINEX  DM) 30-600 MG per 12 hr tablet 1 tablet, 1 tablet, Oral, BID PRN, Dezii, Alexandra, DO .  enoxaparin  (LOVENOX ) injection 40 mg, 40 mg, Subcutaneous, Q24H, Djan, Prince T, MD, 40 mg at 12/23/23 2103 .  furosemide  (LASIX ) tablet 20 mg, 20 mg, Oral, Daily, Dezii, Alexandra, DO, 20 mg at 12/23/23 0927 .  ipratropium-albuterol  (DUONEB) 0.5-2.5 (3) MG/3ML nebulizer solution 3 mL, 3 mL, Nebulization, Q4H PRN, Djan, Prince T, MD .  ipratropium-albuterol  (DUONEB) 0.5-2.5 (3) MG/3ML nebulizer solution 3 mL, 3 mL, Nebulization, TID, Jaimen Melone, MD, 3 mL at 12/24/23 0757 .  lactulose  (CHRONULAC ) 10 GM/15ML solution 20 g, 20 g, Oral, TID, Dezii, Alexandra, DO, 20 g at 12/23/23 2103 .  [COMPLETED] levofloxacin  (LEVAQUIN ) IVPB 500 mg, 500 mg, Intravenous, Once, Last Rate: 100 mL/hr at 12/21/23 1718, 500 mg at 12/21/23 1718 **FOLLOWED BY** levofloxacin  (LEVAQUIN ) tablet 250 mg, 250 mg, Oral, Daily, Dezii, Alexandra, DO, 250 mg at 12/23/23 9074 .  loratadine  (CLARITIN ) tablet 10 mg, 10 mg, Oral, Daily, Mansy, Jan A, MD, 10 mg at  12/23/23 2103 .  losartan  (COZAAR ) tablet 12.5 mg, 12.5 mg, Oral, Daily, Decoste, Gabriella, PA-C, 12.5 mg at 12/23/23 0926 .  metoprolol  succinate (TOPROL -XL) 24 hr tablet 25 mg, 25 mg, Oral, Daily, Decoste, Gabriella, PA-C, 25 mg at 12/23/23 0925 .  naphazoline-glycerin  (CLEAR EYES REDNESS) ophth solution 1-2 drop, 1-2 drop, Both Eyes, QID PRN, Dezii, Alexandra, DO, 1 drop at 12/23/23 0935 .  Prednisolon-Moxiflox-Bromfenac  1-0.5-0.075 % SOLN 1 drop, 1 drop, Right Eye, BID, Suzann Allean LABOR, RPH, 1 drop at 12/23/23 2104 .  predniSONE   (DELTASONE ) tablet 20 mg, 20 mg, Oral, Q breakfast, Dezii, Alexandra, DO, 20 mg at 12/23/23 9073 .  spironolactone  (ALDACTONE ) tablet 25 mg, 25 mg, Oral, Daily, Decoste, Gabriella, PA-C, 25 mg at 12/23/23 9072    ALLERGIES   Statins, Azithromycin , Clindamycin hcl, Demeclocycline, Erythromycin ethylsuccinate, Penicillins, Shellfish allergy, and Triamcinolone acetonide     REVIEW OF SYSTEMS    Review of Systems:  Gen:  Denies  fever, sweats, chills weigh loss  HEENT: Denies blurred vision, double vision, ear pain, eye pain, hearing loss, nose bleeds, sore throat Cardiac:  No dizziness, chest pain or heaviness, chest tightness,edema Resp:   reports dyspnea chronically  Gi: Denies swallowing difficulty, stomach pain, nausea or vomiting, diarrhea, constipation, bowel incontinence Gu:  Denies bladder incontinence, burning urine Ext:   Denies Joint pain, stiffness or swelling Skin: Denies  skin rash, easy bruising or bleeding or hives Endoc:  Denies polyuria, polydipsia , polyphagia or weight change Psych:   Denies depression, insomnia or hallucinations   Other:  All other systems negative   VS: BP 121/61 (BP Location: Right Arm)   Pulse 86   Temp 97.6 F (36.4 C)   Resp 16   Ht 5' 2 (1.575 m)   Wt 68.5 kg   SpO2 99%   BMI 27.62 kg/m      PHYSICAL EXAM    GENERAL:NAD, no fevers, chills, no weakness no fatigue HEAD: Normocephalic, atraumatic.  EYES: Pupils equal, round, reactive to light. Extraocular muscles intact. No scleral icterus.  MOUTH: Moist mucosal membrane. Dentition intact. No abscess noted.  EAR, NOSE, THROAT: Clear without exudates. No external lesions.  NECK: Supple. No thyromegaly. No nodules. No JVD.  PULMONARY: decreased breath sounds with mild rhonchi worse at bases bilaterally.  CARDIOVASCULAR: S1 and S2. Regular rate and rhythm. No murmurs, rubs, or gallops. No edema. Pedal pulses 2+ bilaterally.  GASTROINTESTINAL: Soft, nontender, nondistended. No  masses. Positive bowel sounds. No hepatosplenomegaly.  MUSCULOSKELETAL: No swelling, clubbing, or edema. Range of motion full in all extremities.  NEUROLOGIC: Cranial nerves II through XII are intact. No gross focal neurological deficits. Sensation intact. Reflexes intact.  SKIN: No ulceration, lesions, rashes, or cyanosis. Skin warm and dry. Turgor intact.  PSYCHIATRIC: Mood, affect within normal limits. The patient is awake, alert and oriented x 3. Insight, judgment intact.       IMAGING   Narrative & Impression  EXAM: CT CHEST WITHOUT CONTRAST 12/21/2023 01:48:42 AM   TECHNIQUE: CT of the chest was performed without the administration of intravenous contrast. Multiplanar reformatted images are provided for review. Automated exposure control, iterative reconstruction, and/or weight based adjustment of the mA/kV was utilized to reduce the radiation dose to as low as reasonably achievable.   COMPARISON: 03/05/2020   CLINICAL HISTORY: Respiratory illness, nondiagnostic xray; acute resp failure with hypercapnea.   FINDINGS:   MEDIASTINUM: Heart and pericardium are unremarkable. The central airways are clear. Mild 3-vessel coronary atherosclerosis. Thoracic aortic atherosclerosis.  LYMPH NODES: No mediastinal, hilar or axillary lymphadenopathy.   LUNGS AND PLEURA: Moderate centrilobular and paraseptal emphysematous changes, upper lung predominant. Trace left pleural effusion with left basilar scarring/atelectasis. Complete right middle lobe collapse, chronic. No focal consolidation or pulmonary edema. No pneumothorax.   SOFT TISSUES/BONES: Mild degenerative changes of the visualized thoracolumbar spine. No acute abnormality of the bones or soft tissues.   UPPER ABDOMEN: Limited images of the upper abdomen demonstrates no acute abnormality.   IMPRESSION: 1. No acute findings. 2. Trace left pleural effusion with left basilar scarring/atelectasis. 3. Complete right  middle lobe collapse, chronic.   Electronically signed by: Pinkie Pebbles MD 12/21/2023 01:54 AM EDT RP Workstation: [id number]    ASSESSMENT/PLAN   Right middle lobe syndrome   - likely related to chronic bronchiectatic changes and mucoid impaction    - query regarding atypical mycobacterial infection   - will treat per generic copd carepath - her EKG does not show QTC prolongation   - continue PT/OT    - VEST therapy is generally helpful but in her case I think metaneb would be more beneficial and I have ordered this with nebs TID   Complete atelectasis of RML   Flutter valve   - nebulizer therapy    - PT/OT   - metaneb recruitment maneuvers with RT TID   Centrilobular emphysema with COPD    - continue current COPD care path    - levofloxacin      Duoneb q6h    - prednisone  taper             Thank you for allowing me to participate in the care of this patient.   Patient/Family are satisfied with care plan and all questions have been answered.    Provider disclosure: Patient with at least one acute or chronic illness or injury that poses a threat to life or bodily function and is being managed actively during this encounter.  All of the below services have been performed independently by signing provider:  review of prior documentation from internal and or external health records.  Review of previous and current lab results.  Interview and comprehensive assessment during patient visit today. Review of current and previous chest radiographs/CT scans. Discussion of management and test interpretation with health care team and patient/family.   This document was prepared using Dragon voice recognition software and may include unintentional dictation errors.     Chavis Tessler, M.D.  Division of Pulmonary & Critical Care Medicine

## 2023-12-25 DIAGNOSIS — I5033 Acute on chronic diastolic (congestive) heart failure: Secondary | ICD-10-CM | POA: Diagnosis not present

## 2023-12-25 MED ORDER — IPRATROPIUM-ALBUTEROL 0.5-2.5 (3) MG/3ML IN SOLN
3.0000 mL | Freq: Two times a day (BID) | RESPIRATORY_TRACT | Status: DC
  Administered 2023-12-25 – 2023-12-26 (×2): 3 mL via RESPIRATORY_TRACT
  Filled 2023-12-25 (×2): qty 3

## 2023-12-25 NOTE — Plan of Care (Signed)
  Problem: Clinical Measurements: Goal: Ability to maintain clinical measurements within normal limits will improve Outcome: Progressing   Problem: Nutrition: Goal: Adequate nutrition will be maintained Outcome: Progressing   Problem: Pain Managment: Goal: General experience of comfort will improve and/or be controlled Outcome: Progressing   Problem: Skin Integrity: Goal: Risk for impaired skin integrity will decrease Outcome: Progressing

## 2023-12-25 NOTE — Hospital Course (Signed)
 Gloria Grimes is a 89 year old female with history of cervical cancer, CHF with preserved EF, recent cataract surgery involving the right eye, who presented with progressive shortness of breath and PND.  On arrival patient was found to be hypoxic to 80%, BNP 453, CXR with vascular congestion and chronic right middle lung collapse.  Patient was admitted for acute on chronic diastolic heart failure exacerbation.  Stay was further complicated by COPD exacerbation.  With IV diuresis and O2 support she has gradually improved.  She still remains deconditioned beyond her baseline but is medically ready for discharge pending home O2 delivery    Hypercapnic and hypoxic respiratory failure Chronic right middle lung collapse - Hypercapnia likely multifactorial, perhaps some component of contraction alkalosis, complicated by chronic CO2 retention, suspected underlying OSA, chronic lung disease, +/- COPD flare - Much improved now.  On 2 L nasal cannula.  Continues to improve - Have ordered for home walk test and home O2.  TOC has called [employer] for O2 delivery. - Patient follows outpatient with pulmonology Dr. Aleskerov, he was consulted during this admission - On steroids, MetaNebs, Levaquin  x 4 days. - Continue with steroid taper - Continue to wean oxygen as tolerated - Continue I-S and FV    Acute on chronic diastolic CHF - BNP elevated to 453 on arrival - Echo this admission: LVEF 60 to 65%, no RWMA, grade 1 diastolic dysfunction.  No significant valvular disease noted. - Continue with low-dose Lasix . - Continue with losartan , though in setting of preserved EF can consider discontinuation to allow for greater diuresis as needed   Lower extremity swelling - This is chronic.  In some part exacerbated by acute CHF exacerbation.  Patient does have stents in the right leg and is followed by [facility]. - Daily low-dose Lasix . - Dopplers negative for DVT - Per family patient report this is the best the patient's  legs have looked in years. - TED hose as patient can tolerate   Tobacco abuse - Quit smoking 40 years prior. - Emphysematous changes on CT though patient does not carry a diagnosis of COPD   Hypertension - Resume meds and titrate as needed   Generalized weakness - Patient remains deconditioning compared to her baseline though much improved since admission.  She was previously independent and easily ambulatory, though does have a walker at home.  She has been working well with physical therapy during this stay.  She is using a walker and requires 24/7 standby assist and would do well with the short SNF stay to return to her previous level of functioning and independence.  Unfortunately this was denied by [employer].  I spoke directly with medical director Dr. Missie on peer to peer today.  She remains steadfast that patient does not require skilled nursing facility, and has denied our request.  Dr. Missie suggested home health services for physical therapy 3 times a week and personal care technicians to assist with daily needs.  She confirms that insurance will not provide assistance or payment for these private care technicians that she has suggested. Patient's son is going to pursue skilled nursing facility at private pay with plans to discharge tomorrow.  In the interim we will arrange home health O2 and patient will discharge by private vehicle.

## 2023-12-25 NOTE — Consult Note (Signed)
 WOC Nurse Consult Note: Reason for Consult: Engineer, mining bilateral legs due swelling. No open wound. Pressure Injury POA: NA  Note: I visited this pt regarding a unna boot reapply. The was not using them and she refuses to apply. The say that was a torture take it off. The alternative is apply a light compression with Kerlix and Coban. She accept instead.   Dressing procedure/placement/frequency: Wrapped with Kerlix and Coban. Change daily.   Instruction for bed nurse: Wrap both layers, from the base of the toes to the knee. The compression begins at the ankle.  WOC team will not plan to follow further. Please reconsult if further assistance is needed. Thank-you,  Lela Holm BSN, CNS, RN, [employer], WOCN  (Phone [phone])

## 2023-12-25 NOTE — Progress Notes (Signed)
 Occupational Therapy Treatment Patient Details Name: Gloria Grimes MRN: [id number] DOB: 09/04/1930 Today's Date: 12/25/2023   History of present illness Pt is a 89 y/o female admitted secondary to SOB and found to have acute on chronic HFpEF. PMH including but not limited to cervical cancer, CHF with EF greater than 55% based on echo in 2022 done at [facility].   OT comments  Pt seen for OT tx. Pt eager to discharge and requests updates regarding STR plans. Pt continues to demonstrate deficits in balance, activity tolerance and generalized weakness. She typically is independent without AD and requires use of RW for increased stability during mobilization. Pt completes functional STS transfers with CGA and RW, t/f bathroom for toileting needs, supervision for clothing mgmt and hygiene, stands at sink for oral care and completes UB/LB bathing + dressing CGA-MIN A. Pt is able to ambulate short distance without AD around bed to recliner, up to MIN A for stability. Pt below functional baseline, discharge recommendation appropriate. Patient will benefit from continued inpatient follow up therapy, <3 hours/day       If plan is discharge home, recommend the following:  A little help with walking and/or transfers;Two people to help with bathing/dressing/bathroom;Help with stairs or ramp for entrance;Assist for transportation   Equipment Recommendations  BSC/3in1       Precautions / Restrictions Precautions Precautions: Fall Recall of Precautions/Restrictions: Intact Restrictions Weight Bearing Restrictions Per Provider Order: No       Mobility Bed Mobility Overal bed mobility: Modified Independent                  Transfers Overall transfer level: Needs assistance Equipment used: Rolling walker (2 wheels) Transfers: Sit to/from Stand Sit to Stand: Contact guard assist           General transfer comment: multiple STS transfers from recliner, EOB and commode      Balance Overall balance assessment: Needs assistance Sitting-balance support: Feet supported Sitting balance-Leahy Scale: Good Sitting balance - Comments: sitting EOB   Standing balance support: Bilateral upper extremity supported, During functional activity, Reliant on assistive device for balance Standing balance-Leahy Scale: Fair                             ADL either performed or assessed with clinical judgement   ADL Overall ADL's : Needs assistance/impaired     Grooming: Contact guard assist;Standing;Wash/dry hands;Wash/dry face;Oral care;Sitting Grooming Details (indicate cue type and reason): sink level for oral care/washing hands, seated EOB to wash face Upper Body Bathing: Set up;Sitting Upper Body Bathing Details (indicate cue type and reason): sitting EOB Lower Body Bathing: Minimal assistance;Sit to/from stand Lower Body Bathing Details (indicate cue type and reason): steadying assist Upper Body Dressing : Sitting;Set up   Lower Body Dressing: Contact guard assist Lower Body Dressing Details (indicate cue type and reason): donn/doff underwear Toilet Transfer: Contact guard assist;Ambulation;Rolling walker (2 wheels);Grab bars Toilet Transfer Details (indicate cue type and reason): CGA with RW Toileting- Clothing Manipulation and Hygiene: Supervision/safety;Sit to/from stand       Functional mobility during ADLs: Contact guard assist;Rolling walker (2 wheels) General ADL Comments: t/f bathroom using RW with CGA, MIN A for ambulation around bed to recliner due to unsteadyness     Communication Communication Communication: Impaired Factors Affecting Communication: Hearing impaired   Cognition Arousal: Alert Behavior During Therapy: WFL for tasks assessed/performed Cognition: No apparent impairments  Following commands: Intact        Cueing   Cueing Techniques: Verbal cues, Tactile cues  Exercises  Exercises: Other exercises Other Exercises Other Exercises: Pt and son with concerns re: update for discharge plans. Sent TOC secure chat and provided family members with updates.       General Comments RN in room during session    Pertinent Vitals/ Pain       Pain Assessment Pain Assessment: No/denies pain         Frequency  Min 2X/week        Progress Toward Goals  OT Goals(current goals can now be found in the care plan section)  Progress towards OT goals: Progressing toward goals  Acute Rehab OT Goals OT Goal Formulation: With patient Time For Goal Achievement: 01/06/24 Potential to Achieve Goals: Good ADL Goals Pt Will Perform Grooming: with modified independence;sitting;standing Pt Will Perform Lower Body Dressing: with modified independence;sitting/lateral leans;sit to/from stand Pt Will Transfer to Toilet: with modified independence;ambulating Pt Will Perform Toileting - Clothing Manipulation and hygiene: with modified independence;sitting/lateral leans;sit to/from stand  Plan         AM-PAC OT 6 Clicks Daily Activity     Outcome Measure   Help from another person eating meals?: None Help from another person taking care of personal grooming?: None Help from another person toileting, which includes using toliet, bedpan, or urinal?: A Little Help from another person bathing (including washing, rinsing, drying)?: A Little Help from another person to put on and taking off regular upper body clothing?: None Help from another person to put on and taking off regular lower body clothing?: A Little 6 Click Score: 21    End of Session Equipment Utilized During Treatment: Rolling walker (2 wheels);Oxygen  OT Visit Diagnosis: Other abnormalities of gait and mobility (R26.89)   Activity Tolerance Patient tolerated treatment well   Patient Left with call bell/phone within reach;with family/visitor present;in chair;with chair alarm set   Nurse Communication  Mobility status        Time: 9072-8993 OT Time Calculation (min): 39 min  Charges: OT General Charges $OT Visit: 1 Visit OT Treatments $Self Care/Home Management : 38-52 mins  Dawnetta Copenhaver L. Ranulfo Kall, OTR/L  12/25/23, 1:07 PM

## 2023-12-25 NOTE — Progress Notes (Signed)
 SATURATION QUALIFICATIONS: (This note is used to comply with regulatory documentation for home oxygen)  Patient Saturations on Room Air at Rest = 91%  Patient Saturations on Room Air while Ambulating = 86%  Patient Saturations on 2 Liters of oxygen while Ambulating = 96%  Please briefly explain why patient needs home oxygen: Patient's o2 drops while ambulating or with longer activity like going to bathroom.

## 2023-12-25 NOTE — Progress Notes (Signed)
 PROGRESS NOTE    Gloria Grimes  FMW:[id number] DOB: 1930-12-10 DOA: 12/17/2023 PCP: Valora Agent, MD  Chief Complaint  Patient presents with   Shortness of Breath    Hospital Course:  Gloria Grimes is a 89 year old female with history of cervical cancer, CHF with preserved EF, recent cataract surgery involving the right eye, who presented with progressive shortness of breath and PND.  On arrival patient was found to be hypoxic to 80%, BNP 453, CXR with vascular congestion and chronic right middle lung collapse.  Patient was admitted for acute on chronic diastolic heart failure exacerbation.  Stay was further complicated by COPD exacerbation.  With IV diuresis and O2 support she has gradually improved.  She still remains deconditioned beyond her baseline but is medically ready for discharge pending home O2 delivery  Subjective: No acute events.  Patient remains anxious to discharge to skilled nursing facility.  Objective: Vitals:   12/25/23 0500 12/25/23 0749 12/25/23 1300 12/25/23 1518  BP:  (!) 115/49  (!) 108/51  Pulse:  80  86  Resp:  16  20  Temp:  97.8 F (36.6 C)  98.3 F (36.8 C)  TempSrc:      SpO2:  97% 93% 93%  Weight: 70 kg     Height:        Intake/Output Summary (Last 24 hours) at 12/25/2023 1520 Last data filed at 12/25/2023 0900 Gross per 24 hour  Intake 480 ml  Output --  Net 480 ml   Filed Weights   12/23/23 0500 12/23/23 2343 12/25/23 0500  Weight: 67.1 kg 68.5 kg 70 kg    Examination: General exam: Appears calm and comfortable, NAD  Respiratory system: No work of breathing, symmetric chest wall expansion, no significant wheezing.  On 2 L without evidence of respiratory distress. Cardiovascular system: S1 & S2 heard, RRR.  Gastrointestinal system: Abdomen is nondistended, soft and nontender.  Neuro: Alert and oriented. No focal neurological deficits. Extremities: No pitting edema, no erythema skin: No rashes, lesions Psychiatry: Demonstrates  appropriate judgement and insight. Mood & affect appropriate for situation.   Assessment & Plan:  Principal Problem:   CHF (congestive heart failure) (HCC)    Hypercapnic and hypoxic respiratory failure Chronic right middle lung collapse - Hypercapnia likely multifactorial, perhaps some component of contraction alkalosis, complicated by chronic CO2 retention, suspected underlying OSA, chronic lung disease, +/- COPD flare - Much improved now.  On 2 L nasal cannula.  Continues to improve - Have ordered for home walk test and home O2.  TOC has called [employer] for O2 delivery. - Patient follows outpatient with pulmonology Dr. Aleskerov, he was consulted during this admission - On steroids, MetaNebs, Levaquin  x 4 days. - Continue with steroid taper - Continue to wean oxygen as tolerated - Continue I-S and FV   Acute on chronic diastolic CHF - BNP elevated to 453 on arrival - Echo this admission: LVEF 60 to 65%, no RWMA, grade 1 diastolic dysfunction.  No significant valvular disease noted. - Continue with low-dose Lasix . - Continue with losartan , though in setting of preserved EF can consider discontinuation to allow for greater diuresis as needed  Lower extremity swelling - This is chronic.  In some part exacerbated by acute CHF exacerbation.  Patient does have stents in the right leg and is followed by [facility]. - Daily low-dose Lasix . - Dopplers negative for DVT - Per family patient report this is the best the patient's legs have looked in years. - TED hose  as patient can tolerate  Tobacco abuse - Quit smoking 40 years prior. - Emphysematous changes on CT though patient does not carry a diagnosis of COPD  Hypertension - Resume meds and titrate as needed  Generalized weakness - Patient remains deconditioning compared to her baseline though much improved since admission.  She was previously independent and easily ambulatory, though does have a walker at home.  She has been working well  with physical therapy during this stay.  She is using a walker and requires 24/7 standby assist and would do well with the short SNF stay to return to her previous level of functioning and independence.  Unfortunately this was denied by [employer].  I spoke directly with medical director Dr. Missie on peer to peer today.  She remains steadfast that patient does not require skilled nursing facility, and has denied our request.  Dr. Missie suggested home health services for physical therapy 3 times a week and personal care technicians to assist with daily needs.  She confirms that insurance will not provide assistance or payment for these private care technicians that she has suggested. Patient's son is going to pursue skilled nursing facility at private pay with plans to discharge tomorrow.  In the interim we will arrange home health O2 and patient will discharge by private vehicle.  DVT prophylaxis: Lovenox    Code Status: Limited: Do not attempt resuscitation (DNR) -DNR-LIMITED -Do Not Intubate/DNI  Disposition: Medically ready for discharge.  Planning to discharge to SNF in a.m. by private vehicle.  Arranging home health O2  Consultants:  Treatment Team:  Consulting Physician: Ammon Blunt, MD Consulting Physician: Parris Manna, MD  Procedures:    Antimicrobials:  Anti-infectives (From admission, onward)    Start     Dose/Rate Route Frequency Ordered Stop   12/22/23 1000  levofloxacin  (LEVAQUIN ) tablet 250 mg       Placed in Followed by Linked Group   250 mg Oral Daily 12/21/23 1417 12/25/23 0943   12/21/23 1600  levofloxacin  (LEVAQUIN ) IVPB 500 mg       Placed in Followed by Linked Group   500 mg 100 mL/hr over 60 Minutes Intravenous  Once 12/21/23 1417 12/21/23 1818   12/21/23 1445  levofloxacin  (LEVAQUIN ) IVPB 750 mg  Status:  Discontinued        750 mg 100 mL/hr over 90 Minutes Intravenous Every 24 hours 12/21/23 1346 12/21/23 1417       Data Reviewed: I have  personally reviewed following labs and imaging studies CBC: Recent Labs  Lab 12/19/23 0433 12/20/23 0312  WBC 8.2 7.1  HGB 14.8 14.1  HCT 48.3* 46.0  MCV 93.6 93.5  PLT 203 202   Basic Metabolic Panel: Recent Labs  Lab 12/20/23 0312 12/20/23 2217 12/21/23 0419 12/22/23 0232 12/23/23 0839  NA 142 142 143 136 139  K 4.2 3.9 3.7 4.1 4.0  CL 93* 89* 91* 96* 98  CO2 38* 44* 44* 33* 34*  GLUCOSE 114* 149* 90 117* 88  BUN 17 18 17 21  25*  CREATININE 0.53 0.53 0.50 0.53 0.54  CALCIUM  9.4 9.5 9.4 10.6* 10.4*   GFR: Estimated Creatinine Clearance: 42 mL/min (by C-G formula based on SCr of 0.54 mg/dL). Liver Function Tests: Recent Labs  Lab 12/20/23 2217 12/23/23 0839  AST 14* 13*  ALT 15 12  ALKPHOS 42 36*  BILITOT 0.9 0.6  PROT 6.0* 5.6*  ALBUMIN 3.2* 3.0*   CBG: Recent Labs  Lab 12/20/23 2158  GLUCAP 147*  No results found for this or any previous visit (from the past 240 hours).   Radiology Studies: No results found.   Scheduled Meds:  enoxaparin  (LOVENOX ) injection  40 mg Subcutaneous Q24H   furosemide   20 mg Oral Daily   ipratropium-albuterol   3 mL Nebulization BID   lactulose   20 g Oral TID   loratadine   10 mg Oral Daily   losartan   12.5 mg Oral Daily   metoprolol  succinate  25 mg Oral Daily   Prednisolon-Moxiflox-Bromfenac   1 drop Right Eye BID   predniSONE   20 mg Oral Q breakfast   spironolactone   25 mg Oral Daily   Continuous Infusions:     LOS: 8 days  MDM: Patient is high risk for one or more organ failure.  They necessitate ongoing hospitalization for continued IV therapies and subsequent lab monitoring. Total time spent interpreting labs and vitals, reviewing the medical record, coordinating care amongst consultants and care team members, directly assessing and discussing care with the patient and/or family: 55 min  Gloria Iseminger, DO [facility]  To contact the attending physician between 7A-7P please use Epic Chat. To contact  the covering physician during after hours 7P-7A, please review Amion.  12/25/2023, 3:20 PM   *This document has been created with the assistance of dictation software. Please excuse typographical errors. *

## 2023-12-25 NOTE — TOC Progression Note (Signed)
 Transition of Care [facility]) - Progression Note    Patient Details  Name: Gloria Grimes MRN: [id number] Date of Birth: April 20, 1931  Transition of Care [facility]) CM/SW Contact  Alvaro Louder, [place] Phone Number: 12/25/2023, 3:29 PM  Clinical Narrative:    LCSWA met with patient and son Gloria Grimes at bedside. LCSWA discussed SNF auth being denied and next steps. Gloria Grimes indicated that he will private pay for room at [facility]. The patient and Gloria Grimes indicated that they want O2 at discharge. MD put in orders for O2. LCSWA contacted [employer] DME coordinator and set up delivery of O2.    TOC to follow for discharge                      Expected Discharge Plan and Services                                               Social Drivers of Health (SDOH) Interventions SDOH Screenings   Food Insecurity: No Food Insecurity (12/18/2023)  Housing: Low Risk  (12/18/2023)  Transportation Needs: No Transportation Needs (12/18/2023)  Utilities: Not At Risk (12/18/2023)  Financial Resource Strain: Patient Declined (12/20/2022)   Received from [facility]  Social Connections: Moderately Isolated (12/18/2023)  Tobacco Use: Medium Risk (12/17/2023)    Readmission Risk Interventions     No data to display

## 2023-12-25 NOTE — Plan of Care (Signed)
   Problem: Education: Goal: Knowledge of General Education information will improve Description: Including pain rating scale, medication(s)/side effects and non-pharmacologic comfort measures Outcome: Progressing   Problem: Activity: Goal: Risk for activity intolerance will decrease Outcome: Progressing   Problem: Nutrition: Goal: Adequate nutrition will be maintained Outcome: Progressing

## 2023-12-26 ENCOUNTER — Ambulatory Visit: Admit: 2023-12-26 | Admitting: Ophthalmology

## 2023-12-26 DIAGNOSIS — I5033 Acute on chronic diastolic (congestive) heart failure: Secondary | ICD-10-CM | POA: Diagnosis not present

## 2023-12-26 DIAGNOSIS — J9621 Acute and chronic respiratory failure with hypoxia: Secondary | ICD-10-CM | POA: Diagnosis present

## 2023-12-26 SURGERY — PHACOEMULSIFICATION, CATARACT, WITH IOL INSERTION
Anesthesia: Topical | Laterality: Left

## 2023-12-26 MED ORDER — LOSARTAN POTASSIUM 25 MG PO TABS: 12.5000 mg | ORAL_TABLET | Freq: Every day | ORAL | 0 refills | Status: AC

## 2023-12-26 MED ORDER — SPIRONOLACTONE 25 MG PO TABS: 25.0000 mg | ORAL_TABLET | Freq: Every day | ORAL | 0 refills | Status: AC

## 2023-12-26 MED ORDER — FUROSEMIDE 40 MG PO TABS: 20.0000 mg | ORAL_TABLET | Freq: Every day | ORAL | Status: AC

## 2023-12-26 MED ORDER — METOPROLOL SUCCINATE ER 25 MG PO TB24: 25.0000 mg | ORAL_TABLET | Freq: Every day | ORAL | 0 refills | Status: AC

## 2023-12-26 NOTE — Progress Notes (Addendum)
 Report called into [facility], [place] side, report given to [place], [place]. Informed that patient lives in independent living side and is going to SNF to regain strength, with goals of weening off o2 with exertion. Informed that patient did not take spiroldactone and lasix  this morning due to pending DC and wanted to wait until she got to facility. AVS done by charge nurse, patient assisted with getting dressing. IV removed. Patient collected all belongings. To DC via private motor vehicle.   1120 Spoke to [place] at [place] and made aware that DC summary was not sent over and will work on getting sent. FAX [phone]

## 2023-12-26 NOTE — Plan of Care (Signed)
  Problem: Health Behavior/Discharge Planning: Goal: Ability to manage health-related needs will improve Outcome: Progressing   Problem: Clinical Measurements: Goal: Diagnostic test results will improve Outcome: Progressing   Problem: Nutrition: Goal: Adequate nutrition will be maintained Outcome: Progressing   Problem: Safety: Goal: Ability to remain free from injury will improve Outcome: Progressing

## 2023-12-26 NOTE — Plan of Care (Signed)
   Problem: Education: Goal: Knowledge of General Education information will improve Description: Including pain rating scale, medication(s)/side effects and non-pharmacologic comfort measures Outcome: Progressing   Problem: Activity: Goal: Risk for activity intolerance will decrease Outcome: Progressing   Problem: Nutrition: Goal: Adequate nutrition will be maintained Outcome: Progressing   Problem: Coping: Goal: Level of anxiety will decrease Outcome: Progressing

## 2023-12-26 NOTE — TOC Transition Note (Signed)
 Transition of Care [facility]) - Discharge Note   Patient Details  Name: Gloria Grimes MRN: [id number] Date of Birth: 1930/09/02  [facility]) CM/SW Contact:  Gloria Louder, LCSW Phone Number: 12/26/2023, 11:20 AM   Clinical Narrative:   LCSWA notified son Gloria Grimes and they are in agreement with discharge. O2 delivered to patient at bedside. Patient will be going to [facility] with private pay from family. Son Gloria Grimes will transport patient to facility.   TOC signing off    Final next level of care: Skilled Nursing Facility Barriers to Discharge: No Barriers Identified   Patient Goals and CMS Choice            Discharge Placement              Patient chooses bed at: [facility] Patient to be transferred to facility by: Son Gloria Grimes Name of family member notified: Self Patient and family notified of of transfer: 12/26/23  Discharge Plan and Services Additional resources added to the After Visit Summary for                                       Social Drivers of Health (SDOH) Interventions SDOH Screenings   Food Insecurity: No Food Insecurity (12/18/2023)  Housing: Low Risk  (12/18/2023)  Transportation Needs: No Transportation Needs (12/18/2023)  Utilities: Not At Risk (12/18/2023)  Financial Resource Strain: Patient Declined (12/20/2022)   Received from [facility]  Social Connections: Moderately Isolated (12/18/2023)  Tobacco Use: Medium Risk (12/17/2023)     Readmission Risk Interventions     No data to display

## 2023-12-26 NOTE — Discharge Summary (Addendum)
 Physician Discharge Summary   Patient: Gloria Grimes MRN: [id number] DOB: Dec 01, 1930  Admit date:     12/17/2023  Discharge date: 12/26/23  Discharge Physician: AIDA CHO   PCP: Valora Agent, MD   Recommendations at discharge:   Follow-up with physician at the nursing home within 3 days of discharge  Discharge Diagnoses: Principal Problem:   CHF (congestive heart failure) (HCC) Active Problems:   Acute on chronic respiratory failure with hypoxia and hypercapnia (HCC)  Resolved Problems:   * No resolved hospital problems. *  Hospital Course:  Gloria Grimes is a 89 year old female with history of cervical cancer, CHF with preserved EF, recent cataract surgery involving the right eye, who presented with progressive shortness of breath and PND.  On arrival patient was found to be hypoxic to 80%, BNP 453, CXR with vascular congestion and chronic right middle lung collapse.  Patient was admitted for acute on chronic diastolic heart failure exacerbation.  Stay was further complicated by COPD exacerbation.  With IV diuresis and O2 support she has gradually improved.  She still remains deconditioned beyond her baseline but is medically ready for discharge pending home O2 delivery     Assessment and Plan:   Acute on chronic hypoxic and hypercapnic respiratory failure Chronic right middle lung collapse Completed steroids and Levaquin . She is requiring 2 L/min oxygen via [place].  She could not be weaned off of oxygen so she was discharged on home oxygen. She follows with Dr. Aleskerov, pulmonologist as an outpatient.  She said she has been told she may need BiPAP in the near future.   Acute on chronic diastolic CHF Improved.  She will be discharged on Lasix , losartan  and spironolactone . BNP 453 2D echo showed EF estimated at 60 to 65%, grade 1 diastolic dysfunction.   Lower extremity swelling Usually has chronic leg swelling but this is probably flared up from a CHF exacerbation.   Improved. Venous duplex negative for DVT. She has stents in the right leg and is followed by vascular surgery.   Hypertension Continue antihypertensives   History of remote tobacco use disorder Moderate centrilobular and paraseptal emphysematous changes, upper lung predominant Outpatient follow-up with pulmonologist Bronchodilators as needed.  General Weakness PT recommended discharge to SNF.       Consultants: None Procedures performed: None Disposition: Home Diet recommendation:  Discharge Diet Orders (From admission, onward)     Start     Ordered   12/26/23 0000  Diet - low sodium heart healthy        12/26/23 0922           Cardiac diet DISCHARGE MEDICATION: Allergies as of 12/26/2023       Reactions   Statins Swelling   Azithromycin  Other (See Comments)   Clindamycin Hcl Other (See Comments)   Demeclocycline Other (See Comments)   Erythromycin Ethylsuccinate Other (See Comments)   Penicillins Other (See Comments)   Shellfish Allergy Other (See Comments)   Other reaction(s): Headache Other reaction(s): Other (See Comments)   Triamcinolone Acetonide Other (See Comments)        Medication List     STOP taking these medications    carvedilol  6.25 MG tablet Commonly known as: COREG        TAKE these medications    ascorbic acid  500 MG tablet Commonly known as: VITAMIN C  Take 500 mg by mouth daily.   aspirin EC 81 MG tablet Take 81 mg by mouth daily. Swallow whole.   cetirizine 10 MG tablet Commonly  known as: ZYRTEC Take 10 mg by mouth daily.   Culturelle Digestive Health Caps Take by mouth.   furosemide  40 MG tablet Commonly known as: LASIX  Take 0.5 tablets (20 mg total) by mouth daily. What changed: how much to take   losartan  25 MG tablet Commonly known as: COZAAR  Take 0.5 tablets (12.5 mg total) by mouth daily.   metoprolol  succinate 25 MG 24 hr tablet Commonly known as: TOPROL -XL Take 1 tablet (25 mg total) by mouth  daily.   spironolactone  25 MG tablet Commonly known as: ALDACTONE  Take 1 tablet (25 mg total) by mouth daily.               Durable Medical Equipment  (From admission, onward)           Start     Ordered   12/26/23 1128  For home use only DME oxygen  Once       Comments: POC Eval 1-5L Pulse Dose. if patient qualifies to keep sats at or above 90%, dispense.  Question Answer Comment  Length of Need 6 Months   Mode or (Route) Nasal cannula   Liters per Minute 2   Frequency Continuous (stationary and portable oxygen unit needed)   Oxygen conserving device Yes   Oxygen delivery system Gas      12/26/23 1128   12/25/23 1512  For home use only DME Other see comment  Once       Comments: POC Eval 1-5L Pulse Dose  Question:  Length of Need  Answer:  6 Months   12/25/23 1511   12/25/23 1502  For home use only DME Other see comment  Once       Comments: Portable O2  Question:  Length of Need  Answer:  6 Months   12/25/23 1504              Discharge Care Instructions  (From admission, onward)           Start     Ordered   12/26/23 0000  Discharge wound care:       Comments: Bilateral legs: Wrapped with Kerlix and Coban. Apply since the base of toes, until below the knee. Change daily.   12/26/23 9077            Follow-up Information     Paraschos, Marsa, MD. Go in 2 week(s).   Specialty: Cardiology Contact information: [address] [facility] [place] [address] [phone]         Hudson, Caralyn, PA-C. Go in 1 week(s).   Specialty: Cardiology Why: HF clinic on 07/30 at 9:30 AM Contact information: [address] [place] [address] [phone]                Discharge Exam: Gloria Grimes Weights   12/23/23 2343 12/25/23 0500 12/26/23 0500  Weight: 68.5 kg 70 kg 71 kg   GEN: NAD SKIN: Warm and dry EYES: No pallor or icterus ENT: MMM CV: RRR PULM: CTA B ABD: soft, ND, NT, +BS CNS: AAO x  3, non focal EXT: Trace bilateral leg edema, no tenderness   Condition at discharge: stable  The results of significant diagnostics from this hospitalization (including imaging, microbiology, ancillary and laboratory) are listed below for reference.   Imaging Studies: CT HEAD WO CONTRAST ( ) Result Date: 12/21/2023 CLINICAL DATA:  Mental status change, unknown cause EXAM: CT HEAD WITHOUT CONTRAST TECHNIQUE: Contiguous axial images were obtained from the base of the skull through  the vertex without intravenous contrast. RADIATION DOSE REDUCTION: This exam was performed according to the departmental dose-optimization program which includes automated exposure control, adjustment of the mA and/or kV according to patient size and/or use of iterative reconstruction technique. COMPARISON:  CT head November 24, 2019. FINDINGS: Brain: No evidence of acute infarction, hemorrhage, hydrocephalus, extra-axial collection or mass lesion/mass effect. Vascular: No hyperdense vessel or unexpected calcification. Skull: Normal. Negative for fracture or focal lesion. Sinuses/Orbits: No acute finding. IMPRESSION: No acute abnormality. Electronically Signed   By: Gilmore GORMAN Molt M.D.   On: 12/21/2023 02:23   CT CHEST WO CONTRAST Result Date: 12/21/2023 EXAM: CT CHEST WITHOUT CONTRAST 12/21/2023 01:48:42 AM TECHNIQUE: CT of the chest was performed without the administration of intravenous contrast. Multiplanar reformatted images are provided for review. Automated exposure control, iterative reconstruction, and/or weight based adjustment of the mA/kV was utilized to reduce the radiation dose to as low as reasonably achievable. COMPARISON: 03/05/2020 CLINICAL HISTORY: Respiratory illness, nondiagnostic xray; acute resp failure with hypercapnea. FINDINGS: MEDIASTINUM: Heart and pericardium are unremarkable. The central airways are clear. Mild 3-vessel coronary atherosclerosis. Thoracic aortic atherosclerosis. LYMPH NODES: No  mediastinal, hilar or axillary lymphadenopathy. LUNGS AND PLEURA: Moderate centrilobular and paraseptal emphysematous changes, upper lung predominant. Trace left pleural effusion with left basilar scarring/atelectasis. Complete right middle lobe collapse, chronic. No focal consolidation or pulmonary edema. No pneumothorax. SOFT TISSUES/BONES: Mild degenerative changes of the visualized thoracolumbar spine. No acute abnormality of the bones or soft tissues. UPPER ABDOMEN: Limited images of the upper abdomen demonstrates no acute abnormality. IMPRESSION: 1. No acute findings. 2. Trace left pleural effusion with left basilar scarring/atelectasis. 3. Complete right middle lobe collapse, chronic. Electronically signed by: Pinkie Pebbles MD 12/21/2023 01:54 AM EDT RP Workstation: [id number]   US  Venous Img Lower Bilateral (DVT) Result Date: 12/20/2023 CLINICAL DATA:  Lower extremity edema. EXAM: BILATERAL LOWER EXTREMITY VENOUS DOPPLER ULTRASOUND TECHNIQUE: Gray-scale sonography with graded compression, as well as color Doppler and duplex ultrasound were performed to evaluate the lower extremity deep venous systems from the level of the common femoral vein and including the common femoral, femoral, profunda femoral, popliteal and calf veins including the posterior tibial, peroneal and gastrocnemius veins when visible. The superficial great saphenous vein was also interrogated. Spectral Doppler was utilized to evaluate flow at rest and with distal augmentation maneuvers in the common femoral, femoral and popliteal veins. COMPARISON:  None Available. FINDINGS: RIGHT LOWER EXTREMITY Common Femoral Vein: No evidence of thrombus. Normal compressibility, respiratory phasicity and response to augmentation. Saphenofemoral Junction: No evidence of thrombus. Normal compressibility and flow on color Doppler imaging. Profunda Femoral Vein: No evidence of thrombus. Normal compressibility and flow on color Doppler imaging. Femoral  Vein: No evidence of thrombus. Normal compressibility, respiratory phasicity and response to augmentation. Popliteal Vein: No evidence of thrombus. Normal compressibility, respiratory phasicity and response to augmentation. Calf Veins: No evidence of thrombus. Normal compressibility and flow on color Doppler imaging. Superficial Great Saphenous Vein: No evidence of thrombus. Normal compressibility. Venous Reflux:  None. Other Findings: No evidence of superficial thrombophlebitis or abnormal fluid collection. LEFT LOWER EXTREMITY Common Femoral Vein: No evidence of thrombus. Normal compressibility, respiratory phasicity and response to augmentation. Saphenofemoral Junction: No evidence of thrombus. Normal compressibility and flow on color Doppler imaging. Profunda Femoral Vein: No evidence of thrombus. Normal compressibility and flow on color Doppler imaging. Femoral Vein: No evidence of thrombus. Normal compressibility, respiratory phasicity and response to augmentation. Popliteal Vein: No evidence of thrombus. Normal compressibility, respiratory phasicity and  response to augmentation. Calf Veins: No evidence of thrombus. Normal compressibility and flow on color Doppler imaging. Superficial Great Saphenous Vein: No evidence of thrombus. Normal compressibility. Venous Reflux:  None. Other Findings: No evidence of superficial thrombophlebitis or abnormal fluid collection. IMPRESSION: No evidence of deep venous thrombosis in either lower extremity. Electronically Signed   By: Marcey Moan M.D.   On: 12/20/2023 16:25   DG Chest Port 1 View Result Date: 12/20/2023 CLINICAL DATA:  Hypoxia.  Congestive heart failure. EXAM: PORTABLE CHEST 1 VIEW COMPARISON:  12/17/2023 FINDINGS: Stable heart size and mediastinal contours. Increase in peribronchial thickening. There may be small pleural effusions. Atelectasis at the lung bases, more so on the left. No pneumothorax IMPRESSION: 1. Increase in peribronchial thickening,  may represent bronchitis or pulmonary edema. 2. Possible small pleural effusions. Bibasilar atelectasis. Electronically Signed   By: Andrea Gasman M.D.   On: 12/20/2023 14:21   ECHOCARDIOGRAM COMPLETE Result Date: 12/18/2023    ECHOCARDIOGRAM REPORT   Patient Name:   MARCHELLE RINELLA Date of Exam: 12/18/2023 Medical Rec #:  [id number]      Height:       62.0 in Accession #:    [id number]     Weight:       151.2 lb Date of Birth:  Nov 19, 1930      BSA:          1.698 m Patient Age:    89 years       BP:           138/59 mmHg Patient Gender: F              HR:           93 bpm. Exam Location:  [facility] Procedure: 2D Echo, Cardiac Doppler and Color Doppler (Both Spectral and Color            Flow Doppler were utilized during procedure). Indications:     CHF-acute diastolic I50.31  History:         Patient has no prior history of Echocardiogram examinations.                  CHF. Cancer.  Sonographer:     Christopher Furnace Referring Phys:  [id number] [name] Diagnosing Phys: [name] MD  Sonographer Comments: Suboptimal parasternal window and suboptimal apical window. Best view is subcostal. IMPRESSIONS  1. Left ventricular ejection fraction, by estimation, is 60 to 65%. The left ventricle has normal function. The left ventricle has no regional wall motion abnormalities. Left ventricular diastolic parameters are consistent with Grade I diastolic dysfunction (impaired relaxation).  2. Right ventricular systolic function is normal. The right ventricular size is normal.  3. The mitral valve is normal in structure. Mild mitral valve regurgitation. No evidence of mitral stenosis.  4. The aortic valve is normal in structure. Aortic valve regurgitation is not visualized. No aortic stenosis is present.  5. The inferior vena cava is normal in size with greater than 50% respiratory variability, suggesting right atrial pressure of 3 mmHg. FINDINGS  Left Ventricle: Left ventricular ejection fraction, by estimation, is 60 to  65%. The left ventricle has normal function. The left ventricle has no regional wall motion abnormalities. Strain was performed and the global longitudinal strain is indeterminate. The left ventricular internal cavity size was normal in size. There is no left ventricular hypertrophy. Left ventricular diastolic parameters are consistent with Grade I diastolic dysfunction (impaired relaxation). Right Ventricle: The right ventricular size is  normal. No increase in right ventricular wall thickness. Right ventricular systolic function is normal. Left Atrium: Left atrial size was normal in size. Right Atrium: Right atrial size was normal in size. Pericardium: There is no evidence of pericardial effusion. Mitral Valve: The mitral valve is normal in structure. Mild mitral valve regurgitation. No evidence of mitral valve stenosis. MV peak gradient, 6.5 mmHg. The mean mitral valve gradient is 3.0 mmHg. Tricuspid Valve: The tricuspid valve is normal in structure. Tricuspid valve regurgitation is mild . No evidence of tricuspid stenosis. Aortic Valve: The aortic valve is normal in structure. Aortic valve regurgitation is not visualized. No aortic stenosis is present. Aortic valve mean gradient measures 3.0 mmHg. Aortic valve peak gradient measures 5.4 mmHg. Aortic valve area, by VTI measures 2.36 cm. Pulmonic Valve: The pulmonic valve was normal in structure. Pulmonic valve regurgitation is not visualized. No evidence of pulmonic stenosis. Aorta: The aortic root is normal in size and structure. Venous: The inferior vena cava is normal in size with greater than 50% respiratory variability, suggesting right atrial pressure of 3 mmHg. IAS/Shunts: No atrial level shunt detected by color flow Doppler. Additional Comments: 3D was performed not requiring image post processing on an independent workstation and was indeterminate.  LEFT VENTRICLE PLAX 2D LVIDd:         4.30 cm LVIDs:         2.70 cm LV PW:         1.00 cm LV IVS:         1.00 cm LVOT diam:     2.00 cm LV SV:         53 LV SV Index:   31 LVOT Area:     3.14 cm  RIGHT VENTRICLE RV Basal diam:  3.90 cm RV Mid diam:    2.80 cm LEFT ATRIUM             Index        RIGHT ATRIUM           Index LA diam:        3.10 cm 1.83 cm/m   RA Area:     15.90 cm LA Vol (A2C):   32.1 ml 18.91 ml/m  RA Volume:   42.10 ml  24.80 ml/m LA Vol (A4C):   27.1 ml 15.96 ml/m LA Biplane Vol: 31.2 ml 18.38 ml/m  AORTIC VALVE AV Area (Vmax):    2.22 cm AV Area (Vmean):   1.92 cm AV Area (VTI):     2.36 cm AV Vmax:           116.50 cm/s AV Vmean:          85.650 cm/s AV VTI:            0.225 m AV Peak Grad:      5.4 mmHg AV Mean Grad:      3.0 mmHg LVOT Vmax:         82.20 cm/s LVOT Vmean:        52.400 cm/s LVOT VTI:          0.169 m LVOT/AV VTI ratio: 0.75  AORTA Ao Root diam: 2.40 cm MITRAL VALVE                TRICUSPID VALVE MV Area (PHT): 4.36 cm     TR Peak grad:   26.6 mmHg MV Area VTI:   2.34 cm     TR Vmax:        258.00 cm/s  MV Peak grad:  6.5 mmHg MV Mean grad:  3.0 mmHg     SHUNTS MV Vmax:       1.27 m/s     Systemic VTI:  0.17 m MV Vmean:      84.8 cm/s    Systemic Diam: 2.00 cm MV Decel Time: 174 msec MV E velocity: 65.00 cm/s MV A velocity: 104.00 cm/s MV E/A ratio:  0.62 [name] MD Electronically signed by [name] MD Signature Date/Time: 12/18/2023/1:26:03 PM    Final    DG Chest 2 View Result Date: 12/17/2023 CLINICAL DATA:  Shortness of breath. EXAM: CHEST - 2 VIEW COMPARISON:  CT chest 03/05/2020 and chest radiograph 11/25/2019. FINDINGS: Trachea is midline. Heart size stable. Thoracic aorta is calcified. Chronic right middle lobe collapse lungs are otherwise clear. Trace bilateral pleural effusions. IMPRESSION: 1. Trace bilateral pleural effusions. 2. Chronic right middle lobe collapse. Electronically Signed   By: Newell Eke M.D.   On: 12/17/2023 13:25    Microbiology: Results for orders placed or performed during the hospital encounter of  03/08/16  Culture, blood (routine x 2)     Status: None   Collection Time: 03/08/16  8:32 AM   Specimen: BLOOD  Result Value Ref Range Status   Specimen Description BLOOD RIGHT ASSIST CONTROL  Final   Special Requests BOTTLES DRAWN AEROBIC AND ANAEROBIC  7CC  Final   Culture NO GROWTH 5 DAYS  Final   Report Status 03/13/2016 FINAL  Final  Culture, blood (routine x 2)     Status: None   Collection Time: 03/08/16  8:32 AM   Specimen: BLOOD  Result Value Ref Range Status   Specimen Description BLOOD LEFT ASSIST CONTROL  Final   Special Requests BOTTLES DRAWN AEROBIC AND ANAEROBIC  10CC  Final   Culture NO GROWTH 5 DAYS  Final   Report Status 03/13/2016 FINAL  Final  Culture, expectorated sputum-assessment     Status: None   Collection Time: 03/08/16  5:54 PM   Specimen: Sputum  Result Value Ref Range Status   Specimen Description SPUTUM  Final   Special Requests NONE  Final   Sputum evaluation THIS SPECIMEN IS ACCEPTABLE FOR SPUTUM CULTURE  Final   Report Status 03/08/2016 FINAL  Final  Culture, respiratory (NON-Expectorated)     Status: None   Collection Time: 03/08/16  5:54 PM   Specimen: SPU  Result Value Ref Range Status   Specimen Description SPUTUM  Final   Special Requests NONE Reflexed from [id number]  Final   Gram Stain   Final    ABUNDANT WBC PRESENT, PREDOMINANTLY PMN RARE GRAM POSITIVE COCCI IN PAIRS RARE GRAM NEGATIVE RODS    Culture   Final    Consistent with normal respiratory flora. Performed at [facility]    Report Status 03/11/2016 FINAL  Final    Labs: CBC: Recent Labs  Lab 12/20/23 0312  WBC 7.1  HGB 14.1  HCT 46.0  MCV 93.5  PLT 202   Basic Metabolic Panel: Recent Labs  Lab 12/20/23 0312 12/20/23 2217 12/21/23 0419 12/22/23 0232 12/23/23 0839  NA 142 142 143 136 139  K 4.2 3.9 3.7 4.1 4.0  CL 93* 89* 91* 96* 98  CO2 38* 44* 44* 33* 34*  GLUCOSE 114* 149* 90 117* 88  BUN 17 18 17 21  25*  CREATININE 0.53 0.53 0.50 0.53 0.54   CALCIUM  9.4 9.5 9.4 10.6* 10.4*   Liver Function Tests: Recent Labs  Lab 12/20/23  2217 12/23/23 0839  AST 14* 13*  ALT 15 12  ALKPHOS 42 36*  BILITOT 0.9 0.6  PROT 6.0* 5.6*  ALBUMIN 3.2* 3.0*   CBG: Recent Labs  Lab 12/20/23 2158  GLUCAP 147*    Discharge time spent: greater than 30 minutes.  Signed: AIDA CHO, MD [facility] 12/26/2023

## 2024-02-08 ENCOUNTER — Encounter (INDEPENDENT_AMBULATORY_CARE_PROVIDER_SITE_OTHER)

## 2024-02-08 ENCOUNTER — Ambulatory Visit (INDEPENDENT_AMBULATORY_CARE_PROVIDER_SITE_OTHER): Admitting: Vascular Surgery

## 2024-03-10 ENCOUNTER — Other Ambulatory Visit (INDEPENDENT_AMBULATORY_CARE_PROVIDER_SITE_OTHER): Payer: Self-pay | Admitting: Nurse Practitioner

## 2024-03-10 DIAGNOSIS — Z9889 Other specified postprocedural states: Secondary | ICD-10-CM

## 2024-03-11 ENCOUNTER — Encounter (INDEPENDENT_AMBULATORY_CARE_PROVIDER_SITE_OTHER): Payer: Self-pay | Admitting: Vascular Surgery

## 2024-03-11 ENCOUNTER — Ambulatory Visit (INDEPENDENT_AMBULATORY_CARE_PROVIDER_SITE_OTHER)

## 2024-03-11 ENCOUNTER — Ambulatory Visit (INDEPENDENT_AMBULATORY_CARE_PROVIDER_SITE_OTHER): Admitting: Vascular Surgery

## 2024-03-11 VITALS — BP 128/74 | HR 82 | Resp 16 | Ht 62.0 in | Wt 146.8 lb

## 2024-03-11 DIAGNOSIS — I739 Peripheral vascular disease, unspecified: Secondary | ICD-10-CM

## 2024-03-11 DIAGNOSIS — I70221 Atherosclerosis of native arteries of extremities with rest pain, right leg: Secondary | ICD-10-CM

## 2024-03-11 DIAGNOSIS — I10 Essential (primary) hypertension: Secondary | ICD-10-CM

## 2024-03-11 DIAGNOSIS — Z9889 Other specified postprocedural states: Secondary | ICD-10-CM | POA: Diagnosis not present

## 2024-03-11 NOTE — Progress Notes (Signed)
 MRN : [id number]  Gloria Grimes is a 89 y.o. (04-Oct-1930) female who presents with chief complaint of  Chief Complaint  Patient presents with   Follow-up    6  month follow up with ABI  .  History of Present Illness: Patient returns today in follow up of her PAD.  She is doing quite well.  She remains very active.  She is walking getting around without any significant claudication symptoms.  No ischemic rest pain.  No ulceration.  Her ABIs today remain quite poor and actually dropped on the left side down to 0.35 of.  Her right ABI is stable at 0.48.  Her digit pressures are 36 on the right and 41 on the left.  Current Outpatient Medications  Medication Sig Dispense Refill   ascorbic acid  (VITAMIN C ) 500 MG tablet Take 500 mg by mouth daily.     cetirizine (ZYRTEC) 10 MG tablet Take 10 mg by mouth daily.     furosemide  (LASIX ) 40 MG tablet Take 0.5 tablets (20 mg total) by mouth daily.     Lactobacillus-Inulin (CULTURELLE DIGESTIVE HEALTH) CAPS Take by mouth.     losartan  (COZAAR ) 25 MG tablet Take 0.5 tablets (12.5 mg total) by mouth daily. 30 tablet 0   metoprolol  succinate (TOPROL -XL) 25 MG 24 hr tablet Take 1 tablet (25 mg total) by mouth daily. 30 tablet 0   spironolactone  (ALDACTONE ) 25 MG tablet Take 1 tablet (25 mg total) by mouth daily. 30 tablet 0   aspirin EC 81 MG tablet Take 81 mg by mouth daily. Swallow whole. (Patient not taking: Reported on 12/04/2023)     No current facility-administered medications for this visit.    Past Medical History:  Diagnosis Date   Cancer (HCC)    ovarian   CHF (congestive heart failure) (HCC)     Past Surgical History:  Procedure Laterality Date   ABDOMINAL HYSTERECTOMY     CATARACT EXTRACTION W/PHACO Right 12/12/2023   Procedure: PHACOEMULSIFICATION, CATARACT, WITH IOL INSERTION 12.79 01:03.7;  Surgeon: Mittie Gaskin, MD;  Location: [facility] OR[facility];  Service: Ophthalmology;  Laterality: Right;   LOWER EXTREMITY ANGIOGRAPHY Right  12/16/2020   Procedure: LOWER EXTREMITY ANGIOGRAPHY;  Surgeon: Marea Selinda RAMAN, MD;  Location: [facility];  Service: Cardiovascular;  Laterality: Right;     Social History   Tobacco Use   Smoking status: Former   Smokeless tobacco: Never  Substance Use Topics   Alcohol use: No    Comment: wine   Drug use: No      History reviewed. No pertinent family history.   Allergies  Allergen Reactions   Statins Swelling   Azithromycin  Other (See Comments)   Clindamycin Hcl Other (See Comments)   Demeclocycline Other (See Comments)   Erythromycin Ethylsuccinate Other (See Comments)   Penicillins Other (See Comments)   Shellfish Allergy Other (See Comments)    Other reaction(s): Headache Other reaction(s): Other (See Comments)   Triamcinolone Acetonide Other (See Comments)     REVIEW OF SYSTEMS (Negative unless checked)  Constitutional: [] Weight loss  [] Fever  [] Chills Cardiac: [] Chest pain   [] Chest pressure   [] Palpitations   [] Shortness of breath when laying flat   [] Shortness of breath at rest   [] Shortness of breath with exertion. Vascular:  [] Pain in legs with walking   [] Pain in legs at rest   [] Pain in legs when laying flat   [] Claudication   [] Pain in feet when walking  [] Pain in feet at rest  []   Pain in feet when laying flat   [] History of DVT   [] Phlebitis   [] Swelling in legs   [] Varicose veins   [] Non-healing ulcers Pulmonary:   [] Uses home oxygen   [] Productive cough   [] Hemoptysis   [] Wheeze  [] COPD   [] Asthma Neurologic:  [] Dizziness  [] Blackouts   [] Seizures   [] History of stroke   [] History of TIA  [] Aphasia   [] Temporary blindness   [] Dysphagia   [] Weakness or numbness in arms   [] Weakness or numbness in legs Musculoskeletal:  [x] Arthritis   [] Joint swelling   [x] Joint pain   [] Low back pain Hematologic:  [] Easy bruising  [] Easy bleeding   [] Hypercoagulable state   [] Anemic   Gastrointestinal:  [] Blood in stool   [] Vomiting blood  [] Gastroesophageal  reflux/heartburn   [] Abdominal pain Genitourinary:  [] Chronic kidney disease   [] Difficult urination  [] Frequent urination  [] Burning with urination   [] Hematuria Skin:  [] Rashes   [] Ulcers   [] Wounds Psychological:  [] History of anxiety   []  History of major depression.  Physical Examination  BP 128/74 (BP Location: Left Arm, Patient Position: Sitting, Cuff Size: Normal)   Pulse 82   Resp 16   Ht 5' 2 (1.575 m)   Wt 146 lb 12.8 oz (66.6 kg)   BMI 26.85 kg/m  Gen:  WD/WN, NAD. Appears younger than stated age. Head: [place]/AT, No temporalis wasting. Ear/Nose/Throat: Hearing grossly intact, nares w/o erythema or drainage Eyes: Conjunctiva clear. Sclera non-icteric Neck: Supple.  Trachea midline Pulmonary:  Good air movement, no use of accessory muscles.  Cardiac: somewhat irregular Vascular:  Vessel Right Left  Radial Palpable Palpable                          PT Trace Palpable Not Palpable  DP 1+ Palpable 1+ Palpable   Gastrointestinal: soft, non-tender/non-distended. No guarding/reflex.  Musculoskeletal: M/S 5/5 throughout.  No deformity or atrophy. No  edema. Neurologic: Sensation grossly intact in extremities.  Symmetrical.  Speech is fluent.  Psychiatric: Judgment intact, Mood & affect appropriate for pt's clinical situation. Dermatologic: No rashes or ulcers noted.  No cellulitis or open wounds.      Labs Recent Results (from the past 2160 hours)  Basic metabolic panel     Status: Abnormal   Collection Time: 12/17/23  1:15 PM  Result Value Ref Range   Sodium 137 135 - 145 mmol/L   Potassium 4.4 3.5 - 5.1 mmol/L   Chloride 91 (L) 98 - 111 mmol/L   CO2 34 (H) 22 - 32 mmol/L   Glucose, Bld 109 (H) 70 - 99 mg/dL    Comment: Glucose reference range applies only to samples taken after fasting for at least 8 hours.   BUN 19 8 - 23 mg/dL   Creatinine, Ser 9.35 0.44 - 1.00 mg/dL   Calcium  9.7 8.9 - 10.3 mg/dL   GFR, Estimated >39 >39 mL/min    Comment:  (NOTE) Calculated using the CKD-EPI Creatinine Equation (2021)    Anion gap 12 5 - 15    Comment: Performed at [facility], [address] [place], [place] [address]  CBC     Status: Abnormal   Collection Time: 12/17/23  1:15 PM  Result Value Ref Range   WBC 8.8 4.0 - 10.5 K/uL   RBC 5.51 (H) 3.87 - 5.11 MIL/uL   Hemoglobin 15.7 (H) 12.0 - 15.0 g/dL   HCT 51.0 (H) 63.9 - 53.9 %   MCV  88.7 80.0 - 100.0 fL   MCH 28.5 26.0 - 34.0 pg   MCHC 32.1 30.0 - 36.0 g/dL   RDW 84.8 88.4 - 84.4 %   Platelets 234 150 - 400 K/uL   nRBC 0.0 0.0 - 0.2 %    Comment: Performed at [facility], [address] [place], [place] [address]  Brain natriuretic peptide     Status: Abnormal   Collection Time: 12/17/23  1:15 PM  Result Value Ref Range   B Natriuretic Peptide 453.6 (H) 0.0 - 100.0 pg/mL    Comment: Performed at [facility], [address] [place], [place] [address]  Troponin I (High Sensitivity)     Status: None   Collection Time: 12/17/23  1:15 PM  Result Value Ref Range   Troponin I (High Sensitivity) 10 <18 ng/L    Comment: (NOTE) Elevated high sensitivity troponin I (hsTnI) values and significant  changes across serial measurements may suggest ACS but many other  chronic and acute conditions are known to elevate hsTnI results.  Refer to the Links section for chest pain algorithms and additional  guidance. Performed at [facility], [address] [place], [place] [address]   Troponin I (High Sensitivity)     Status: None   Collection Time: 12/17/23  4:48 PM  Result Value Ref Range   Troponin I (High Sensitivity) 10 <18 ng/L    Comment: (NOTE) Elevated high sensitivity troponin I (hsTnI) values and significant  changes across serial measurements may suggest ACS but many other  chronic and acute conditions are known to elevate hsTnI results.  Refer to the Links section for chest pain algorithms and additional  guidance. Performed at  [facility], [address] [place], [place] [address]   CBC with Differential/Platelet     Status: Abnormal   Collection Time: 12/18/23  9:31 AM  Result Value Ref Range   WBC 8.2 4.0 - 10.5 K/uL   RBC 5.33 (H) 3.87 - 5.11 MIL/uL   Hemoglobin 15.5 (H) 12.0 - 15.0 g/dL   HCT 51.2 (H) 63.9 - 53.9 %   MCV 91.4 80.0 - 100.0 fL   MCH 29.1 26.0 - 34.0 pg   MCHC 31.8 30.0 - 36.0 g/dL   RDW 84.6 88.4 - 84.4 %   Platelets 232 150 - 400 K/uL   nRBC 0.0 0.0 - 0.2 %   Neutrophils Relative % 79 %   Neutro Abs 6.5 1.7 - 7.7 K/uL   Lymphocytes Relative 10 %   Lymphs Abs 0.8 0.7 - 4.0 K/uL   Monocytes Relative 9 %   Monocytes Absolute 0.8 0.1 - 1.0 K/uL   Eosinophils Relative 2 %   Eosinophils Absolute 0.1 0.0 - 0.5 K/uL   Basophils Relative 0 %   Basophils Absolute 0.0 0.0 - 0.1 K/uL   Immature Granulocytes 0 %   Abs Immature Granulocytes 0.03 0.00 - 0.07 K/uL    Comment: Performed at [facility], [address] [place], [place] [address]  Basic metabolic panel     Status: Abnormal   Collection Time: 12/18/23  9:31 AM  Result Value Ref Range   Sodium 140 135 - 145 mmol/L   Potassium 3.8 3.5 - 5.1 mmol/L   Chloride 92 (L) 98 - 111 mmol/L   CO2 34 (H) 22 - 32 mmol/L   Glucose, Bld 114 (H) 70 - 99 mg/dL    Comment: Glucose reference range applies only to samples taken after fasting for  at least 8 hours.   BUN 16 8 - 23 mg/dL   Creatinine, Ser 9.37 0.44 - 1.00 mg/dL   Calcium  9.4 8.9 - 10.3 mg/dL   GFR, Estimated >39 >39 mL/min    Comment: (NOTE) Calculated using the CKD-EPI Creatinine Equation (2021)    Anion gap 14 5 - 15    Comment: Performed at [facility], [address] [place], [place] [address]  ECHOCARDIOGRAM COMPLETE     Status: None   Collection Time: 12/18/23 10:05 AM  Result Value Ref Range   Weight 2,419.77 oz   Height 62 in   BP 138/59 mmHg   Ao pk vel 1.17 m/s   AV Area VTI 2.36 cm2   AR max vel 2.22 cm2   AV Mean grad 3.0 mmHg    AV Peak grad 5.4 mmHg   S' Lateral 2.70 cm   AV Area mean vel 1.92 cm2   Area-P 1/2 4.36 cm2   MV VTI 2.34 cm2   Est EF 60 - 65%   Basic metabolic panel with GFR     Status: Abnormal   Collection Time: 12/19/23  4:33 AM  Result Value Ref Range   Sodium 140 135 - 145 mmol/L   Potassium 3.5 3.5 - 5.1 mmol/L   Chloride 91 (L) 98 - 111 mmol/L   CO2 37 (H) 22 - 32 mmol/L   Glucose, Bld 116 (H) 70 - 99 mg/dL    Comment: Glucose reference range applies only to samples taken after fasting for at least 8 hours.   BUN 20 8 - 23 mg/dL   Creatinine, Ser 9.40 0.44 - 1.00 mg/dL   Calcium  8.9 8.9 - 10.3 mg/dL   GFR, Estimated >39 >39 mL/min    Comment: (NOTE) Calculated using the CKD-EPI Creatinine Equation (2021)    Anion gap 12 5 - 15    Comment: Performed at [facility], [address] [place], [place] [address]  CBC     Status: Abnormal   Collection Time: 12/19/23  4:33 AM  Result Value Ref Range   WBC 8.2 4.0 - 10.5 K/uL   RBC 5.16 (H) 3.87 - 5.11 MIL/uL   Hemoglobin 14.8 12.0 - 15.0 g/dL   HCT 51.6 (H) 63.9 - 53.9 %   MCV 93.6 80.0 - 100.0 fL   MCH 28.7 26.0 - 34.0 pg   MCHC 30.6 30.0 - 36.0 g/dL   RDW 84.7 88.4 - 84.4 %   Platelets 203 150 - 400 K/uL   nRBC 0.0 0.0 - 0.2 %    Comment: Performed at [facility], [address], [place], [place] [address]  Basic metabolic panel with GFR     Status: Abnormal   Collection Time: 12/20/23  3:12 AM  Result Value Ref Range   Sodium 142 135 - 145 mmol/L   Potassium 4.2 3.5 - 5.1 mmol/L   Chloride 93 (L) 98 - 111 mmol/L   CO2 38 (H) 22 - 32 mmol/L   Glucose, Bld 114 (H) 70 - 99 mg/dL    Comment: Glucose reference range applies only to samples taken after fasting for at least 8 hours.   BUN 17 8 - 23 mg/dL   Creatinine, Ser 9.46 0.44 - 1.00 mg/dL   Calcium  9.4 8.9 - 10.3 mg/dL   GFR, Estimated >39 >39 mL/min    Comment: (NOTE) Calculated using the CKD-EPI Creatinine Equation (2021)    Anion gap 11 5 - 15  Comment: Performed at [facility], [address] [place], [place] [address]  CBC     Status: None   Collection Time: 12/20/23  3:12 AM  Result Value Ref Range   WBC 7.1 4.0 - 10.5 K/uL   RBC 4.92 3.87 - 5.11 MIL/uL   Hemoglobin 14.1 12.0 - 15.0 g/dL   HCT 53.9 63.9 - 53.9 %   MCV 93.5 80.0 - 100.0 fL   MCH 28.7 26.0 - 34.0 pg   MCHC 30.7 30.0 - 36.0 g/dL   RDW 84.9 88.4 - 84.4 %   Platelets 202 150 - 400 K/uL   nRBC 0.0 0.0 - 0.2 %    Comment: Performed at [facility], [address] [place], [place] [address]  Glucose, capillary     Status: Abnormal   Collection Time: 12/20/23  9:58 PM  Result Value Ref Range   Glucose-Capillary 147 (H) 70 - 99 mg/dL    Comment: Glucose reference range applies only to samples taken after fasting for at least 8 hours.  Comprehensive metabolic panel     Status: Abnormal   Collection Time: 12/20/23 10:17 PM  Result Value Ref Range   Sodium 142 135 - 145 mmol/L   Potassium 3.9 3.5 - 5.1 mmol/L   Chloride 89 (L) 98 - 111 mmol/L   CO2 44 (H) 22 - 32 mmol/L   Glucose, Bld 149 (H) 70 - 99 mg/dL    Comment: Glucose reference range applies only to samples taken after fasting for at least 8 hours.   BUN 18 8 - 23 mg/dL   Creatinine, Ser 9.46 0.44 - 1.00 mg/dL   Calcium  9.5 8.9 - 10.3 mg/dL   Total Protein 6.0 (L) 6.5 - 8.1 g/dL   Albumin 3.2 (L) 3.5 - 5.0 g/dL   AST 14 (L) 15 - 41 U/L   ALT 15 0 - 44 U/L   Alkaline Phosphatase 42 38 - 126 U/L   Total Bilirubin 0.9 0.0 - 1.2 mg/dL   GFR, Estimated >39 >39 mL/min    Comment: (NOTE) Calculated using the CKD-EPI Creatinine Equation (2021)    Anion gap 9 5 - 15    Comment: Performed at [facility], [address] [place], [place] [address]  D-dimer, quantitative     Status: None   Collection Time: 12/20/23 10:17 PM  Result Value Ref Range   D-Dimer, Quant 0.37 0.00 - 0.50 ug/mL-FEU    Comment: (NOTE) At the manufacturer cut-off value of 0.5 g/mL FEU, this assay  has a negative predictive value of 95-100%.This assay is intended for use in conjunction with a clinical pretest probability (PTP) assessment model to exclude pulmonary embolism (PE) and deep venous thrombosis (DVT) in outpatients suspected of PE or DVT. Results should be correlated with clinical presentation. Performed at [facility], [address] [place], [place] [address]   Blood gas, venous     Status: Abnormal   Collection Time: 12/20/23 10:18 PM  Result Value Ref Range   pH, Ven 7.33 7.25 - 7.43   pCO2, Ven 106 (HH) 44 - 60 mmHg    Comment: CRITICAL RESULT CALLED TO, READ BACK BY AND VERIFIED WITH: BRENDA MORRISON AT 2239 ON 12/20/23 KSL    pO2, Ven 84 (H) 32 - 45 mmHg   Bicarbonate 55.9 (H) 20.0 - 28.0 mmol/L   Acid-Base Excess 23.6 (H) 0.0 - 2.0 mmol/L   O2 Saturation 98.1 %   Patient temperature 37.0    Collection  site VEIN     Comment: Performed at [facility], [address] [place], [place] [address]  Basic metabolic panel with GFR     Status: Abnormal   Collection Time: 12/21/23  4:19 AM  Result Value Ref Range   Sodium 143 135 - 145 mmol/L   Potassium 3.7 3.5 - 5.1 mmol/L   Chloride 91 (L) 98 - 111 mmol/L   CO2 44 (H) 22 - 32 mmol/L   Glucose, Bld 90 70 - 99 mg/dL    Comment: Glucose reference range applies only to samples taken after fasting for at least 8 hours.   BUN 17 8 - 23 mg/dL   Creatinine, Ser 9.49 0.44 - 1.00 mg/dL   Calcium  9.4 8.9 - 10.3 mg/dL   GFR, Estimated >39 >39 mL/min    Comment: (NOTE) Calculated using the CKD-EPI Creatinine Equation (2021)    Anion gap 8 5 - 15    Comment: Performed at [facility], [address] [place], [place] [address]  TSH     Status: None   Collection Time: 12/21/23  4:19 AM  Result Value Ref Range   TSH 1.262 0.350 - 4.500 uIU/mL    Comment: Performed by a 3rd Generation assay with a functional sensitivity of <=0.01 uIU/mL. Performed at [facility], [address] [place], [place] [address]   Blood gas, venous     Status: Abnormal   Collection Time: 12/21/23  5:58 AM  Result Value Ref Range   pH, Ven 7.38 7.25 - 7.43   pCO2, Ven 82 (HH) 44 - 60 mmHg    Comment: CRITICAL RESULT CALLED TO, READ BACK BY AND VERIFIED WITH: CRITICAL VALUE 12/21/23,0822 BRENDA MORRISON NP/FD    pO2, Ven 55 (H) 32 - 45 mmHg   Bicarbonate 48.5 (H) 20.0 - 28.0 mmol/L   Acid-Base Excess 18.7 (H) 0.0 - 2.0 mmol/L   O2 Saturation 89.2 %   Patient temperature 37.0    Collection site VEIN     Comment: Performed at [facility], [address] [place], [place] [address]  Basic metabolic panel with GFR     Status: Abnormal   Collection Time: 12/22/23  2:32 AM  Result Value Ref Range   Sodium 136 135 - 145 mmol/L   Potassium 4.1 3.5 - 5.1 mmol/L   Chloride 96 (L) 98 - 111 mmol/L   CO2 33 (H) 22 - 32 mmol/L   Glucose, Bld 117 (H) 70 - 99 mg/dL    Comment: Glucose reference range applies only to samples taken after fasting for at least 8 hours.   BUN 21 8 - 23 mg/dL   Creatinine, Ser 9.46 0.44 - 1.00 mg/dL   Calcium  10.6 (H) 8.9 - 10.3 mg/dL   GFR, Estimated >39 >39 mL/min    Comment: (NOTE) Calculated using the CKD-EPI Creatinine Equation (2021)    Anion gap 7 5 - 15    Comment: Performed at [facility], [address] [place], [place] [address]  Comprehensive metabolic panel with GFR     Status: Abnormal   Collection Time: 12/23/23  8:39 AM  Result Value Ref Range   Sodium 139 135 - 145 mmol/L   Potassium 4.0 3.5 - 5.1 mmol/L   Chloride 98 98 - 111 mmol/L   CO2 34 (H) 22 - 32 mmol/L   Glucose, Bld 88 70 - 99 mg/dL    Comment: Glucose reference range applies only to samples taken after fasting for  at least 8 hours.   BUN 25 (H) 8 - 23 mg/dL   Creatinine, Ser 9.45 0.44 - 1.00 mg/dL   Calcium  10.4 (H) 8.9 - 10.3 mg/dL   Total Protein 5.6 (L) 6.5 - 8.1 g/dL   Albumin 3.0 (L) 3.5 - 5.0 g/dL   AST 13 (L) 15 - 41 U/L   ALT 12 0 - 44 U/L    Alkaline Phosphatase 36 (L) 38 - 126 U/L   Total Bilirubin 0.6 0.0 - 1.2 mg/dL   GFR, Estimated >39 >39 mL/min    Comment: (NOTE) Calculated using the CKD-EPI Creatinine Equation (2021)    Anion gap 7 5 - 15    Comment: Performed at [facility], [address] [place], [place] [address]    Radiology No results found.  Assessment/Plan  Atherosclerosis of native arteries of extremity with rest pain (HCC)  Her ABIs today remain quite poor and actually dropped on the left side down to 0.35 of.  Her right ABI is stable at 0.48.  Her digit pressures are 36 on the right and 41 on the left. Her rest pain symptoms resolved after revascularization about 3 years ago.  She still has very poor ABIs but a paucity of symptoms.  I would not plan on intervention in a nonagenarian with no symptoms even with very poor ABIs.  Should she develop a nonhealing ulceration, rest pain, or disabling claudication symptoms we could revisit our plan.  For now, I would see her back in 6 months with noninvasive studies.  Essential hypertension blood pressure control important in reducing the progression of atherosclerotic disease. On appropriate oral medications.  Selinda Gu, MD  03/11/2024 11:55 AM    This note was created with Dragon medical transcription system.  Any errors from dictation are purely unintentional

## 2024-03-11 NOTE — Assessment & Plan Note (Signed)
 Her ABIs today remain quite poor and actually dropped on the left side down to 0.35 of.  Her right ABI is stable at 0.48.  Her digit pressures are 36 on the right and 41 on the left. Her rest pain symptoms resolved after revascularization about 3 years ago.  She still has very poor ABIs but a paucity of symptoms.  I would not plan on intervention in a nonagenarian with no symptoms even with very poor ABIs.  Should she develop a nonhealing ulceration, rest pain, or disabling claudication symptoms we could revisit our plan.  For now, I would see her back in 6 months with noninvasive studies.

## 2024-03-13 LAB — VAS US ABI WITH/WO TBI
Left ABI: 0.35
Right ABI: 0.48

## 2024-09-16 ENCOUNTER — Encounter (INDEPENDENT_AMBULATORY_CARE_PROVIDER_SITE_OTHER)

## 2024-09-16 ENCOUNTER — Ambulatory Visit (INDEPENDENT_AMBULATORY_CARE_PROVIDER_SITE_OTHER): Admitting: Vascular Surgery
